# Patient Record
Sex: Male | Born: 1935 | Race: White | Hispanic: No | Smoking: Former smoker
Health system: Southern US, Community
[De-identification: ages and names within clinical notes are randomized; demographics above are authoritative.]

## PROBLEM LIST (undated history)

## (undated) DIAGNOSIS — I10 Essential (primary) hypertension: Secondary | ICD-10-CM

## (undated) DIAGNOSIS — I4891 Unspecified atrial fibrillation: Secondary | ICD-10-CM

## (undated) DIAGNOSIS — R001 Bradycardia, unspecified: Secondary | ICD-10-CM

## (undated) DIAGNOSIS — I5189 Other ill-defined heart diseases: Secondary | ICD-10-CM

## (undated) DIAGNOSIS — I350 Nonrheumatic aortic (valve) stenosis: Secondary | ICD-10-CM

## (undated) DIAGNOSIS — E119 Type 2 diabetes mellitus without complications: Secondary | ICD-10-CM

## (undated) DIAGNOSIS — E160 Drug-induced hypoglycemia without coma: Secondary | ICD-10-CM

## (undated) DIAGNOSIS — T383X1A Poisoning by insulin and oral hypoglycemic [antidiabetic] drugs, accidental (unintentional), initial encounter: Secondary | ICD-10-CM

## (undated) DIAGNOSIS — E785 Hyperlipidemia, unspecified: Secondary | ICD-10-CM

## (undated) DIAGNOSIS — F0390 Unspecified dementia without behavioral disturbance: Secondary | ICD-10-CM

## (undated) HISTORY — DX: Nonrheumatic aortic (valve) stenosis: I35.0

## (undated) HISTORY — DX: Bradycardia, unspecified: R00.1

## (undated) HISTORY — DX: Hyperlipidemia, unspecified: E78.5

## (undated) HISTORY — DX: Unspecified atrial fibrillation: I48.91

## (undated) HISTORY — DX: Type 2 diabetes mellitus without complications: E11.9

## (undated) HISTORY — DX: Essential (primary) hypertension: I10

## (undated) HISTORY — DX: Unspecified dementia, unspecified severity, without behavioral disturbance, psychotic disturbance, mood disturbance, and anxiety: F03.90

## (undated) HISTORY — PX: OTHER SURGICAL HISTORY: SHX169

---

## 2000-12-17 ENCOUNTER — Ambulatory Visit (HOSPITAL_COMMUNITY): Admission: RE | Admit: 2000-12-17 | Discharge: 2000-12-17 | Payer: Self-pay | Admitting: Family Medicine

## 2000-12-17 ENCOUNTER — Encounter: Payer: Self-pay | Admitting: Family Medicine

## 2000-12-30 ENCOUNTER — Ambulatory Visit (HOSPITAL_COMMUNITY): Admission: RE | Admit: 2000-12-30 | Discharge: 2000-12-30 | Payer: Self-pay | Admitting: Family Medicine

## 2000-12-30 ENCOUNTER — Encounter: Payer: Self-pay | Admitting: Family Medicine

## 2006-09-29 ENCOUNTER — Inpatient Hospital Stay (HOSPITAL_COMMUNITY): Admission: AC | Admit: 2006-09-29 | Discharge: 2006-10-08 | Payer: Self-pay

## 2006-11-29 ENCOUNTER — Encounter: Admission: RE | Admit: 2006-11-29 | Discharge: 2006-11-29 | Payer: Self-pay | Admitting: Neurosurgery

## 2006-12-04 ENCOUNTER — Encounter (HOSPITAL_COMMUNITY): Admission: RE | Admit: 2006-12-04 | Discharge: 2007-01-07 | Payer: Self-pay | Admitting: Orthopedic Surgery

## 2007-01-09 ENCOUNTER — Encounter (HOSPITAL_COMMUNITY): Admission: RE | Admit: 2007-01-09 | Discharge: 2007-02-08 | Payer: Self-pay | Admitting: Orthopedic Surgery

## 2007-10-27 ENCOUNTER — Emergency Department (HOSPITAL_COMMUNITY): Admission: EM | Admit: 2007-10-27 | Discharge: 2007-10-28 | Payer: Self-pay | Admitting: Emergency Medicine

## 2007-11-26 ENCOUNTER — Ambulatory Visit (HOSPITAL_COMMUNITY): Admission: RE | Admit: 2007-11-26 | Discharge: 2007-11-26 | Payer: Self-pay | Admitting: Family Medicine

## 2007-12-10 ENCOUNTER — Ambulatory Visit: Payer: Self-pay | Admitting: Gastroenterology

## 2007-12-29 ENCOUNTER — Ambulatory Visit (HOSPITAL_COMMUNITY): Admission: RE | Admit: 2007-12-29 | Discharge: 2007-12-29 | Payer: Self-pay | Admitting: Gastroenterology

## 2007-12-29 ENCOUNTER — Ambulatory Visit: Payer: Self-pay | Admitting: Gastroenterology

## 2007-12-29 ENCOUNTER — Encounter: Payer: Self-pay | Admitting: Gastroenterology

## 2009-04-11 ENCOUNTER — Inpatient Hospital Stay (HOSPITAL_COMMUNITY): Admission: EM | Admit: 2009-04-11 | Discharge: 2009-04-12 | Payer: Self-pay | Admitting: Emergency Medicine

## 2009-04-12 ENCOUNTER — Encounter (INDEPENDENT_AMBULATORY_CARE_PROVIDER_SITE_OTHER): Payer: Self-pay | Admitting: Cardiovascular Disease

## 2009-06-08 ENCOUNTER — Emergency Department (HOSPITAL_COMMUNITY): Admission: EM | Admit: 2009-06-08 | Discharge: 2009-06-08 | Payer: Self-pay | Admitting: Emergency Medicine

## 2009-07-18 ENCOUNTER — Ambulatory Visit (HOSPITAL_COMMUNITY): Admission: RE | Admit: 2009-07-18 | Discharge: 2009-07-18 | Payer: Self-pay | Admitting: Urology

## 2010-04-30 ENCOUNTER — Encounter: Payer: Self-pay | Admitting: Nephrology

## 2010-06-24 LAB — CK TOTAL AND CKMB (NOT AT ARMC)
CK, MB: 2.4 ng/mL (ref 0.3–4.0)
CK, MB: 2.5 ng/mL (ref 0.3–4.0)
Relative Index: INVALID (ref 0.0–2.5)
Relative Index: INVALID (ref 0.0–2.5)
Total CK: 96 U/L (ref 7–232)
Total CK: 97 U/L (ref 7–232)

## 2010-06-24 LAB — GLUCOSE, CAPILLARY
Glucose-Capillary: 101 mg/dL — ABNORMAL HIGH (ref 70–99)
Glucose-Capillary: 131 mg/dL — ABNORMAL HIGH (ref 70–99)
Glucose-Capillary: 216 mg/dL — ABNORMAL HIGH (ref 70–99)
Glucose-Capillary: 99 mg/dL (ref 70–99)

## 2010-06-24 LAB — CBC
HCT: 36.1 % — ABNORMAL LOW (ref 39.0–52.0)
HCT: 37.7 % — ABNORMAL LOW (ref 39.0–52.0)
Hemoglobin: 12.3 g/dL — ABNORMAL LOW (ref 13.0–17.0)
MCV: 88.8 fL (ref 78.0–100.0)
RBC: 4.24 MIL/uL (ref 4.22–5.81)
WBC: 7.6 10*3/uL (ref 4.0–10.5)
WBC: 8.3 10*3/uL (ref 4.0–10.5)

## 2010-06-24 LAB — COMPREHENSIVE METABOLIC PANEL
ALT: 18 U/L (ref 0–53)
AST: 16 U/L (ref 0–37)
Albumin: 3.7 g/dL (ref 3.5–5.2)
Alkaline Phosphatase: 22 U/L — ABNORMAL LOW (ref 39–117)
Alkaline Phosphatase: 26 U/L — ABNORMAL LOW (ref 39–117)
BUN: 25 mg/dL — ABNORMAL HIGH (ref 6–23)
BUN: 35 mg/dL — ABNORMAL HIGH (ref 6–23)
CO2: 24 mEq/L (ref 19–32)
Chloride: 101 mEq/L (ref 96–112)
Chloride: 103 mEq/L (ref 96–112)
Creatinine, Ser: 1.33 mg/dL (ref 0.4–1.5)
GFR calc Af Amer: >60 mL/min (ref >60)
GFR calc non Af Amer: 52 mL/min — ABNORMAL LOW (ref >60)
Glucose, Bld: 66 mg/dL — ABNORMAL LOW (ref 70–99)
Potassium: 4.1 mEq/L (ref 3.5–5.1)
Potassium: 4.1 mEq/L (ref 3.5–5.1)
Sodium: 137 mEq/L (ref 135–145)
Total Bilirubin: 0.5 mg/dL (ref 0.3–1.2)
Total Bilirubin: 0.6 mg/dL (ref 0.3–1.2)
Total Protein: 6.5 g/dL (ref 6.0–8.3)

## 2010-06-24 LAB — DIFFERENTIAL
Basophils Absolute: 0.1 10*3/uL (ref 0.0–0.1)
Basophils Absolute: 0.1 10*3/uL (ref 0.0–0.1)
Basophils Relative: 1 % (ref 0–1)
Basophils Relative: 1 % (ref 0–1)
Eosinophils Absolute: 0.2 10*3/uL (ref 0.0–0.7)
Eosinophils Relative: 3 % (ref 0–5)
Lymphocytes Relative: 35 % (ref 12–46)
Monocytes Absolute: 0.8 10*3/uL (ref 0.1–1.0)
Monocytes Relative: 10 % (ref 3–12)
Neutro Abs: 3.9 10*3/uL (ref 1.7–7.7)
Neutrophils Relative %: 51 % (ref 43–77)

## 2010-06-24 LAB — HEPARIN LEVEL (UNFRACTIONATED): Heparin Unfractionated: 0.48 IU/mL (ref 0.30–0.70)

## 2010-06-24 LAB — POCT CARDIAC MARKERS: Myoglobin, poc: 224 ng/mL (ref 12–200)

## 2010-06-24 LAB — CARDIAC PANEL(CRET KIN+CKTOT+MB+TROPI)
CK, MB: 1.8 ng/mL (ref 0.3–4.0)
Relative Index: INVALID (ref 0.0–2.5)
Relative Index: INVALID (ref 0.0–2.5)
Total CK: 91 U/L (ref 7–232)
Total CK: 99 U/L (ref 7–232)
Troponin I: <0.01 ng/mL (ref 0.00–0.06)
Troponin I: <0.01 ng/mL (ref 0.00–0.06)

## 2010-06-24 LAB — LIPID PANEL
Triglycerides: 118 mg/dL (ref <150)
VLDL: 24 mg/dL (ref 0–40)

## 2010-07-02 LAB — POCT I-STAT, CHEM 8
BUN: 25 mg/dL — ABNORMAL HIGH (ref 6–23)
Calcium, Ion: 1.12 mmol/L (ref 1.12–1.32)
Chloride: 108 mEq/L (ref 96–112)
Creatinine, Ser: 1.2 mg/dL (ref 0.4–1.5)
Glucose, Bld: 223 mg/dL — ABNORMAL HIGH (ref 70–99)
HCT: 39 % (ref 39.0–52.0)
Hemoglobin: 13.3 g/dL (ref 13.0–17.0)
Potassium: 4.4 meq/L (ref 3.5–5.1)
Sodium: 138 meq/L (ref 135–145)
TCO2: 23 mmol/L (ref 0–100)

## 2010-07-02 LAB — URINALYSIS, ROUTINE W REFLEX MICROSCOPIC
Glucose, UA: 100 mg/dL — AB
Ketones, ur: NEGATIVE mg/dL
Specific Gravity, Urine: 1.025 (ref 1.005–1.030)
pH: 6.5 (ref 5.0–8.0)

## 2010-07-02 LAB — URINE CULTURE

## 2010-07-02 LAB — URINE MICROSCOPIC-ADD ON

## 2010-08-22 NOTE — Op Note (Signed)
Juan Pham, Juan Pham                 ACCOUNT NO.:  [id number]   MEDICAL RECORD NO.:  [id number]          PATIENT TYPE:  AMB   LOCATION:  DAY                           FACILITY:  [facility]   PHYSICIAN:  Kassie Mends, M.D.      DATE OF BIRTH:  23-Nov-1930   DATE OF PROCEDURE:  12/29/2007  DATE OF DISCHARGE:                               OPERATIVE REPORT   REFERRING PHYSICIAN:  Patrica Duel, MD.   PROCEDURES:  1. Colonoscopy with cold forceps polypectomy.  2. Esophagogastroduodenoscopy with cold forceps biopsy.   INDICATIONS FOR EXAM:  Juan Pham is a 77 year old male who presents with  abdominal pain.  He also had an episode of diverticulitis.  He has GERD  which he is controlled on omeprazole.   FINDINGS:  1. Frequent sigmoid colon diverticula.  A 4-mm sessile descending      colon polyp removed via cold forceps.  Otherwise no masses,      inflammatory changes or AVM seen.  2. Small internal hemorrhoids.  Otherwise normal rectopexy of the      rectum.  3. Normal esophagus without evidence of Barrett's, mass, erosion,      ulcerations, or stricture.  4. Diffuse erythema with intermittent areas of normal gastric mucosa      seen beginning in the proximal stomach and extending into the      antrum.  Few erosions with evidence of active oozing.  No active      bleeding.  Biopsies obtained via cold forceps to evaluate for      a.     pylori gastritis.  5. Normal duodenal bulb and second portion of the duodenum.   DIAGNOSES:  1. Small descending colon polyp.  2. Moderate sigmoid diverticulosis.  3. Small internal hemorrhoids.  4. Moderate gastritis likely secondary to aspirin.   RECOMMENDATIONS:  1. Juan Pham could have a follow up colonoscopy in 10-15 years if he      remains healthy.  2. No aspirin, NSAIDs, or anticoagulation for 7 days.  3. He should follow a high-fiber diet.  He is given a handout on high-      fiber diet, gastritis, constipation, polyps, diverticulosis, and  hemorrhoids.  4. Will call Juan Pham with the results of his biopsies.  He will be      scheduled followup after the biopsy results are known.  5. Continue omeprazole 20 mg daily.   MEDICATIONS:  1. Demerol 50 mg IV.  2. Versed 3 mg IV.   PROCEDURE TECHNIQUE:  Physical exam was performed.  Informed consent was  obtained from the patient after explaining the benefits, risks, and  alternatives of the procedure.  The patient was connected to the monitor  and placed in the left lateral position.  Continuous oxygen was provided  by nasal cannula and IV medicine administered through an indwelling  cannula.  After administration of sedation and rectal exam, the  patient's rectum was intubated and the scope was advanced under direct  visualization to the cecum.  The scope was removed slowly by carefully  examining the color, texture,  anatomy, and integrity of the mucosa on  the way out.   After the colonoscopy, the patient's esophagus was intubated with the  diagnostic gastroscope and the scope was advanced under direct  visualization to the second portion of the duodenum.  The scope was  removed slowly by carefully examining the color, texture, anatomy, and  integrity of the mucosa on the way out.  The patient was recovered in  endoscopy and discharged home in a satisfactory condition.   PATH:  Simple adenoma. Chronic gastritis.      Kassie Mends, M.D.  Electronically Signed     SM/MEDQ  D:  12/29/2007  T:  12/30/2007  Job:  [id number]   cc:   Patrica Duel, M.D.  Fax: [phone]
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
## 2011-01-05 LAB — POCT CARDIAC MARKERS
CKMB, poc: 2.1
Myoglobin, poc: 115
Myoglobin, poc: 72.1
Operator id: 215201

## 2011-01-05 LAB — CBC
MCHC: 33.6
Platelets: 325
RBC: 4.3
WBC: 8.1

## 2011-01-05 LAB — DIFFERENTIAL
Basophils Relative: 0
Monocytes Relative: 10
Neutro Abs: 4.3
Neutrophils Relative %: 53

## 2011-01-05 LAB — BASIC METABOLIC PANEL
BUN: 35 — ABNORMAL HIGH
CO2: 24
Calcium: 9.3
Creatinine, Ser: 1.31
GFR calc Af Amer: >60

## 2011-01-24 LAB — BASIC METABOLIC PANEL
BUN: 18
BUN: 23
BUN: 23
Calcium: 8.8
Chloride: 107
Chloride: 109
Chloride: 96
Creatinine, Ser: 1.01
Creatinine, Ser: 1.07
Creatinine, Ser: 1.12
GFR calc Af Amer: >60
GFR calc Af Amer: >60
GFR calc non Af Amer: 50 — ABNORMAL LOW
GFR calc non Af Amer: >60
Glucose, Bld: 129 — ABNORMAL HIGH
Glucose, Bld: 163 — ABNORMAL HIGH
Potassium: 4.4
Potassium: 4.6
Sodium: 133 — ABNORMAL LOW
Sodium: 140

## 2011-01-24 LAB — CBC
HCT: 35.7 — ABNORMAL LOW
HCT: 37.2 — ABNORMAL LOW
HCT: 38.2 — ABNORMAL LOW
Hemoglobin: 12.5 — ABNORMAL LOW
Hemoglobin: 12.6 — ABNORMAL LOW
Hemoglobin: 12.8 — ABNORMAL LOW
MCHC: 33.4
MCHC: 33.8
MCV: 86.6
MCV: 86.8
MCV: 87.2
MCV: 87.6
Platelets: 309
Platelets: 388
Platelets: 438 — ABNORMAL HIGH
RBC: 4.06 — ABNORMAL LOW
RBC: 4.25
RBC: 4.33
RDW: 14.3 — ABNORMAL HIGH
RDW: 14.5 — ABNORMAL HIGH
WBC: 10.9 — ABNORMAL HIGH
WBC: 12.3 — ABNORMAL HIGH
WBC: 13.7 — ABNORMAL HIGH

## 2011-01-24 LAB — TYPE AND SCREEN: Antibody Screen: NEGATIVE

## 2011-01-24 LAB — I-STAT 8, (EC8 V) (CONVERTED LAB)
Bicarbonate: 21.6
HCT: 40
Hemoglobin: 13.6
Operator id: 151321
Sodium: 138
TCO2: 23
pCO2, Ven: 36.8 — ABNORMAL LOW

## 2011-01-24 LAB — HEMOGLOBIN A1C: Hgb A1c MFr Bld: 7.1 — ABNORMAL HIGH

## 2011-01-24 LAB — PROTIME-INR: INR: 1.2

## 2012-02-21 ENCOUNTER — Other Ambulatory Visit (HOSPITAL_COMMUNITY): Payer: Self-pay

## 2012-02-21 DIAGNOSIS — I359 Nonrheumatic aortic valve disorder, unspecified: Secondary | ICD-10-CM

## 2012-03-12 ENCOUNTER — Other Ambulatory Visit (HOSPITAL_COMMUNITY): Payer: Self-pay | Admitting: Vascular Surgery

## 2012-03-12 DIAGNOSIS — I350 Nonrheumatic aortic (valve) stenosis: Secondary | ICD-10-CM

## 2012-03-13 ENCOUNTER — Other Ambulatory Visit (HOSPITAL_COMMUNITY): Payer: Self-pay | Admitting: Internal Medicine

## 2012-03-13 DIAGNOSIS — R69 Illness, unspecified: Secondary | ICD-10-CM

## 2012-03-24 ENCOUNTER — Other Ambulatory Visit (HOSPITAL_COMMUNITY): Payer: Self-pay | Admitting: Cardiovascular Disease

## 2012-03-24 DIAGNOSIS — I359 Nonrheumatic aortic valve disorder, unspecified: Secondary | ICD-10-CM

## 2012-04-14 ENCOUNTER — Ambulatory Visit (HOSPITAL_COMMUNITY)
Admission: RE | Admit: 2012-04-14 | Discharge: 2012-04-14 | Disposition: A | Discharge status: Home / Self Care | Payer: Medicare Other | Source: Ambulatory Visit | Attending: Cardiovascular Disease | Admitting: Cardiovascular Disease

## 2012-04-14 DIAGNOSIS — I359 Nonrheumatic aortic valve disorder, unspecified: Secondary | ICD-10-CM | POA: Insufficient documentation

## 2012-04-14 DIAGNOSIS — I369 Nonrheumatic tricuspid valve disorder, unspecified: Secondary | ICD-10-CM | POA: Insufficient documentation

## 2012-04-14 DIAGNOSIS — I10 Essential (primary) hypertension: Secondary | ICD-10-CM | POA: Insufficient documentation

## 2012-04-14 DIAGNOSIS — E119 Type 2 diabetes mellitus without complications: Secondary | ICD-10-CM | POA: Insufficient documentation

## 2012-04-14 NOTE — Progress Notes (Signed)
2D Echo Performed 04/14/2012    Clearence Ped, RCS

## 2012-08-26 ENCOUNTER — Ambulatory Visit (HOSPITAL_COMMUNITY)
Admission: RE | Admit: 2012-08-26 | Discharge: 2012-08-26 | Disposition: A | Discharge status: Home / Self Care | Payer: Medicare Other | Source: Ambulatory Visit | Attending: Orthopaedic Surgery | Admitting: Orthopaedic Surgery

## 2012-08-26 DIAGNOSIS — IMO0001 Reserved for inherently not codable concepts without codable children: Secondary | ICD-10-CM | POA: Insufficient documentation

## 2012-08-26 DIAGNOSIS — M542 Cervicalgia: Secondary | ICD-10-CM | POA: Insufficient documentation

## 2012-08-26 DIAGNOSIS — M6281 Muscle weakness (generalized): Secondary | ICD-10-CM | POA: Insufficient documentation

## 2012-09-02 ENCOUNTER — Ambulatory Visit (HOSPITAL_COMMUNITY)
Admission: RE | Admit: 2012-09-02 | Discharge: 2012-09-02 | Disposition: A | Discharge status: Home / Self Care | Payer: Medicare Other | Source: Ambulatory Visit | Attending: Orthopaedic Surgery | Admitting: Orthopaedic Surgery

## 2012-09-02 DIAGNOSIS — M2560 Stiffness of unspecified joint, not elsewhere classified: Secondary | ICD-10-CM

## 2012-09-02 NOTE — Progress Notes (Signed)
Physical Therapy Treatment Patient Details  Name: Juan Pham MRN: [id number] Date of Birth: [birth year] Charge:  There ex 8:45: 913; manual 9:15 -9:25; there ex 2; manual 1 Today's Date: 09/02/2012 Time:845  - 925    Visit#: 2 of 8  Re-eval: 09/25/12    Authorization: BCBS Medicaid  Authorization Visit#:  2 of   8  Subjective: Symptoms/Limitations Symptoms: Juan Pham states that he has been trying to do his exercises at home.   Pain Assessment Pain Score:   5 Pain Location: Neck Pain Orientation: Right;Left Pain Onset: More than a month ago    Exercise/Treatments   Machines for Strengthening UBE (Upper Arm Bike): 4'   Standing Exercises Neck Retraction: 10 reps Other Standing Exercises: scapular protraction/retraction, elevation depression hands on wall x 10 Seated Exercises Cervical Isometrics: Extension;Right lateral flexion;Left lateral flexion;5 reps Lateral Flexion: 5 reps X to V: 10 reps Shoulder Shrugs: 5 reps Shoulder Rolls: 5 reps Supine Exercises Neck Retraction: 5 reps  Manual Therapy Manual Therapy: Myofascial release Myofascial Release: with suboccipital release and traction to B cervical mm to decrease adhesions; Grade II jt mobs  Physical Therapy Assessment and Plan PT Assessment and Plan Clinical Impression Statement: Pt  appears to have some improvement in ROM.  Will continue to progress with stability of cervical and scapular area as well as improving ROM PT Duration: 4 weeks PT Treatment/Interventions: Therapeutic activities;Therapeutic exercise;Modalities;Manual techniques PT Plan: begin standing UE flexion,(may need to just work on being able to stand erect prior to introducing UE flexion)>    Goals  progressing  Problem List Patient Active Problem List   Diagnosis Date Noted  . Stiffness of joints, not elsewhere classified, multiple sites 09/02/2012       GP    Juan Pham,Juan Pham 09/02/2012, 10:14 AM
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
## 2012-09-05 ENCOUNTER — Ambulatory Visit (HOSPITAL_COMMUNITY)
Admission: RE | Admit: 2012-09-05 | Discharge: 2012-09-05 | Disposition: A | Discharge status: Home / Self Care | Payer: Medicare Other | Source: Ambulatory Visit | Attending: Orthopaedic Surgery | Admitting: Orthopaedic Surgery

## 2012-09-05 DIAGNOSIS — M2560 Stiffness of unspecified joint, not elsewhere classified: Secondary | ICD-10-CM

## 2012-09-05 NOTE — Progress Notes (Signed)
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 Pain Assessment Currently in Pain?: No/denies  Objective:   Exercise/Treatments Machines for Strengthening UBE (Upper Arm Bike): 4' backwards Standing Exercises Neck Retraction: 10 reps Upper Extremity Flexion with Stabilization: 5 reps Other Standing Exercises: scapular protraction/retraction, elevation depression hands on wall x 10 Seated Exercises Neck Retraction: 10 reps Cervical Rotation: Both;5 reps Lateral Flexion: 5 reps X to V: 10 reps Shoulder Shrugs: 10 reps Supine Exercises Neck Retraction: 5 reps  Manual Therapy Manual Therapy: Other (comment) Other Manual Therapy: suboccipital release and traction to B cervical mm to decrease adhesions;  Physical Therapy Assessment and Plan PT Assessment and Plan Clinical Impression Statement: Session focus on posture awareness, required multimodal cueing to reduce foward head, foward rolled shoulders. Manual cervical PROM, suboccipitial release and traction complete to improve ROM and reduce tightness.  Pt making gains with ROM. PT Plan: Continue with current POC.    Goals    Problem List Patient Active Problem List   Diagnosis Date Noted  . Stiffness of joints, not elsewhere classified, multiple sites 09/02/2012    PT - End of Session Activity Tolerance: Patient tolerated treatment well General Behavior During Therapy: [facility] for tasks assessed/performed Cognition: WFL for tasks performed  GP    Juel Burrow 09/05/2012, 11:12 AM

## 2012-09-09 ENCOUNTER — Ambulatory Visit (HOSPITAL_COMMUNITY)
Admission: RE | Admit: 2012-09-09 | Discharge: 2012-09-09 | Disposition: A | Discharge status: Home / Self Care | Payer: Medicare Other | Source: Ambulatory Visit | Attending: Orthopaedic Surgery | Admitting: Orthopaedic Surgery

## 2012-09-09 DIAGNOSIS — M6281 Muscle weakness (generalized): Secondary | ICD-10-CM | POA: Insufficient documentation

## 2012-09-09 DIAGNOSIS — M2560 Stiffness of unspecified joint, not elsewhere classified: Secondary | ICD-10-CM

## 2012-09-09 DIAGNOSIS — IMO0001 Reserved for inherently not codable concepts without codable children: Secondary | ICD-10-CM | POA: Insufficient documentation

## 2012-09-09 DIAGNOSIS — M542 Cervicalgia: Secondary | ICD-10-CM | POA: Insufficient documentation

## 2012-09-09 NOTE — Progress Notes (Signed)
Physical Therapy Treatment Patient Details  Name: Juan Pham MRN: [id number] Date of Birth: [birth year]  Today's Date: 09/09/2012 Time: 4098-1191 PT Time Calculation (min): 41 min  Visit#: 4 of 8  Re-eval: 09/25/12  charge:  There ex 8:03-830: manual 8:30-844  Authorization: BCBS Medicaid   Authorization Visit#: 4 of 8   Subjective: Symptoms/Limitations Symptoms: Pt chopped wood for the past two days with increased neck pain Pain Assessment Pain Score:   5 Pain Location: Neck Pain Orientation: Right;Left Pain Type: Chronic pain Pain Onset: More than a month ago    Exercise/Treatments   Machines for Strengthening UBE (Upper Arm Bike): 4' backwards Theraband Exercises Scapula Retraction: 10 reps;Green Shoulder Extension: 10 reps;Green Rows: 10 reps;Green Standing Exercises Neck Retraction: 10 reps Upper Extremity Flexion with Stabilization: 10 reps;Limitations UE Flexion with Stabilization Limitations: to 90 degrees only Other Standing Exercises: scapular protraction/retraction, elevation depression hands on wall x 10 Other Standing Exercises: chest stretch x 10; elbows into corner x 0. Seated Exercises Neck Retraction: 10 reps Cervical Rotation: Both;5 reps Lateral Flexion: 5 reps X to V: 10 reps Shoulder Shrugs: 10 reps Supine Exercises Neck Retraction: 5 reps  Manual Therapy Manual Therapy: Myofascial release Myofascial Release: with suboccipital release and manual traction to B cervical mm; Grade II jt mobilizations  Physical Therapy Assessment and Plan PT Assessment and Plan Clinical Impression Statement: Pt treatment continues to focus on posture and scapular stabilzation .  Pt added new exercises to address these issues.  Pt continues to make small gains with ROM PT Frequency: Min 2X/week PT Duration: 4 weeks PT Plan: Continue with current POC.    Goals   progressing Problem List Patient Active Problem List   Diagnosis Date Noted  . Stiffness of  joints, not elsewhere classified, multiple sites 09/02/2012    PT - End of Session Activity Tolerance: Patient tolerated treatment well General Behavior During Therapy: [facility] for tasks assessed/performed Cognition: WFL for tasks performed PT Plan of Care PT Home Exercise Plan: given  GP Functional Assessment Tool Used: clinical judgement,(pain/ROm  RUSSELL,CINDY 09/09/2012, 8:54 AM

## 2012-09-11 ENCOUNTER — Ambulatory Visit (HOSPITAL_COMMUNITY)
Admission: RE | Admit: 2012-09-11 | Discharge: 2012-09-11 | Disposition: A | Discharge status: Home / Self Care | Payer: Medicare Other | Source: Ambulatory Visit | Attending: Orthopaedic Surgery | Admitting: Orthopaedic Surgery

## 2012-09-11 DIAGNOSIS — M2560 Stiffness of unspecified joint, not elsewhere classified: Secondary | ICD-10-CM

## 2012-09-11 NOTE — Progress Notes (Addendum)
Physical Therapy Treatment Patient Details  Name: Juan Pham MRN: [id number] Date of Birth: 02-Jun-1930  Today's Date: 09/11/2012 Time: 7829-5621 PT Time Calculation (min): 39 min Charge: Therex 30' [phone]), Manual x8' [phone])  Visit#: 5 of 8  Re-eval: 09/25/12    Authorization: BCBS Medicaid  Authorization Time Period:    Authorization Visit#: 5 of 8   Subjective: Symptoms/Limitations Symptoms: Pt reported completeing HEP this morning at the breakfast table, no pain today.  Pt reports reduction in headaches. Pain Assessment Currently in Pain?: No/denies  Objective:   Exercise/Treatments Stretches Corner Stretch: 4 reps;30 seconds Machines for Strengthening UBE (Upper Arm Bike): 4' backwards Theraband Exercises Scapula Retraction: 10 reps;Green Shoulder Extension: 10 reps;Green Rows: 10 reps;Green Standing Exercises Neck Retraction: 10 reps Upper Extremity Flexion with Stabilization: 10 reps;Limitations UE Flexion with Stabilization Limitations: to 90 degrees only Seated Exercises Neck Retraction: 10 reps Supine Exercises Neck Retraction: 5 reps    Physical Therapy Assessment and Plan PT Assessment and Plan Clinical Impression Statement: Continued treatment for postural awareness, postural and scapular strengthening and stabilization.  Pt able to demonstrate decreased forward headed and forward rolled posture at end of sessoni.   PT Plan: Continue with current POC.    Goals    Problem List Patient Active Problem List   Diagnosis Date Noted  . Stiffness of joints, not elsewhere classified, multiple sites 09/02/2012    PT - End of Session Activity Tolerance: Patient tolerated treatment well General Behavior During Therapy: [facility] for tasks assessed/performed Cognition: WFL for tasks performed  GP    Juel Burrow 09/11/2012, 9:04 AM

## 2012-09-16 ENCOUNTER — Ambulatory Visit (HOSPITAL_COMMUNITY)
Admission: RE | Admit: 2012-09-16 | Discharge: 2012-09-16 | Disposition: A | Discharge status: Home / Self Care | Payer: Medicare Other | Source: Ambulatory Visit | Attending: Orthopaedic Surgery | Admitting: Orthopaedic Surgery

## 2012-09-16 NOTE — Progress Notes (Signed)
Physical Therapy Treatment Patient Details  Name: Juan Pham MRN: [id number] Date of Birth: Apr 21, 1930  Today's Date: 09/16/2012 Time: 0802-0850 PT Time Calculation (min): 48 min  Visit#: 6 of 8  Re-eval: 09/25/12 Authorization: BCBS Medicare  Authorization Visit#: 6 of 8  Charges:  Therex 34, manual 12'  Subjective: Symptoms/Limitations Symptoms: Pt states he is doing well today.  Reports overall improving headaches, some Lt.shoulder pain today. Pain Assessment Currently in Pain?: Yes Pain Score:   3 Pain Location: Neck  Exercise/Treatments stetches Corner Stretch: 4 reps;30 seconds Machines for Strengthening UBE (Upper Arm Bike): 5' backwards Theraband Exercises Scapula Retraction: 10 reps;Green Shoulder Extension: 10 reps;Green Rows: 10 reps;Green Standing Exercises Other Standing Exercises: scapular protraction/retraction, elevation depression hands on wall x 10 Seated Exercises Neck Retraction: 10 reps Cervical Rotation: Both;5 reps Shoulder Shrugs: 10 reps   Manual Therapy Myofascial Release: Myofascial release, suboccipital release and manual traction to Bilateral cervical musculature  Physical Therapy Assessment and Plan PT Assessment and Plan Clinical Impression Statement: Pt requires verbal and tactile cues to decrease recruitment of surrounding musculature, namely upper traps with exercises.  Pt with difficulty relaxing trap muscles.   Pt with extreme forward cervical posture which makes it difficul to perform exercises in proper form.   PT Plan: Continue with current POC.  May try seated massage to target cervical-thoracic musculature.     Problem List Patient Active Problem List   Diagnosis Date Noted  . Stiffness of joints, not elsewhere classified, multiple sites 09/02/2012    PT - End of Session Activity Tolerance: Patient tolerated treatment well General Behavior During Therapy: [facility] for tasks assessed/performed Cognition: WFL for tasks  performed   Lurena Nida, PTA/CLT 09/16/2012, 9:38 AM

## 2012-09-18 ENCOUNTER — Ambulatory Visit (HOSPITAL_COMMUNITY)
Admission: RE | Admit: 2012-09-18 | Discharge: 2012-09-18 | Disposition: A | Discharge status: Home / Self Care | Payer: Medicare Other | Source: Ambulatory Visit | Attending: Orthopaedic Surgery | Admitting: Orthopaedic Surgery

## 2012-09-18 NOTE — Progress Notes (Signed)
Physical Therapy Treatment Patient Details  Name: Juan Pham MRN: [id number] Date of Birth: 1930/10/16  Today's Date: 09/18/2012 Time: 4098-1191 PT Time Calculation (min): 38 min  Visit#: 7 of 8  Re-eval: 09/25/12 Charges: Therex x 30' Manual x 8'  Authorization: BCBS Medicare  Authorization Visit#: 7 of 8   Subjective: Symptoms/Limitations Symptoms: Pt is currently pain free. He states that he is doing some of his exercises at home. Pain Assessment Currently in Pain?: No/denies   Exercise/Treatments Stretches Corner Stretch: 2 reps;30 seconds Machines for Strengthening UBE (Upper Arm Bike): 5' @ 2.0 backwards Theraband Exercises Scapula Retraction: 10 reps;Green Shoulder Extension: 10 reps;Green Rows: 10 reps;Green Standing Exercises Other Standing Exercises: scapular protraction/retraction, elevation depression hands on wall x 10 Seated Exercises Neck Retraction: 10 reps Cervical Rotation: 10 reps Lateral Flexion: 10 reps Shoulder Shrugs: 10 reps  Manual Therapy Myofascial Release: to bilateral cervical musculature to decrease tightness and improve functional ROM  Physical Therapy Assessment and Plan PT Assessment and Plan Clinical Impression Statement: Pt requires max multimodal cueing to complete all exercises with correct form. Pt requires frequent vc's to avoid looking down. Pt is limited by poor cervical musculature coordination. Manual techniques completed to bilateral cervical musculature to decrease tightness and improve functional ROM. PT Plan: Reassess next session.     Problem List Patient Active Problem List   Diagnosis Date Noted  . Stiffness of joints, not elsewhere classified, multiple sites 09/02/2012    PT - End of Session Activity Tolerance: Patient tolerated treatment well General Behavior During Therapy: [facility] for tasks assessed/performed Cognition: WFL for tasks performed PT Plan of Care PT Patient Instructions: Pt educated on  improtance of HEP compliance.  Seth Bake, PTA 09/18/2012, 9:37 AM

## 2012-09-23 ENCOUNTER — Ambulatory Visit (HOSPITAL_COMMUNITY)
Admission: RE | Admit: 2012-09-23 | Discharge: 2012-09-23 | Disposition: A | Discharge status: Home / Self Care | Payer: Medicare Other | Source: Ambulatory Visit | Attending: Orthopaedic Surgery | Admitting: Orthopaedic Surgery

## 2012-09-23 DIAGNOSIS — M2560 Stiffness of unspecified joint, not elsewhere classified: Secondary | ICD-10-CM

## 2012-09-23 NOTE — Evaluation (Addendum)
Physical Therapy Reassessment Patient Details  Name: TILLMAN KAZMIERSKI MRN: [id number] Date of Birth: 1930/06/07 Charge:  ROM test [phone]; manual [phone]; there ex 830-840 pt then filled out questionaire. Today's Date: 09/23/2012 Time: 0805-0845 PT Time Calculation (min): 40 min              Visit#: 8 of 8  Re-eval:   Assessment Diagnosis: cervical pain Next MD Visit: 09/24/2012 Prior Therapy: none  Authorization: BCBS Medicare    Authorization Time Period:    Authorization Visit#: 8 of 8   Past Medical History: No past medical history on file. Past Surgical History: No past surgical history on file.  Subjective Symptoms/Limitations Symptoms: Pt states that he is not doing his exercises at home.  Pt states that he feels 30% better since starting therapy How long can you sit comfortably?: no effect How long can you stand comfortably?: no effect How long can you walk comfortably?: no effect Repetition: Increases Symptoms Pain Assessment Pain Score:  (worst pain in the past week has been a 5/10) Pain Location: Neck Pain Orientation: Right Pain Type: Chronic pain Pain Onset: More than a month ago Pain Frequency: Intermittent Pain Relieving Factors: leaning head against his chair at home.  Prior Functioning  Prior Function Vocation: Retired Leisure: Hobbies-yes (Comment)  Cognition/Observation Cognition Overall Cognitive Status: Within Functional Limits for tasks assessed  Sensation/Coordination/Flexibility/Functional Tests Functional Tests Functional Tests: neck disability was 13/50 now 9/50  Assessment Cervical AROM Cervical Flexion: decreased 20% with reps increasing pain,. Cervical Extension: wnl reps increases pain causes pain into shle Cervical - Right Side Bend: decreased 15% wasdecreased 50% Cervical - Left Side Bend: decreased 15% was decreased  30%  Cervical - Right Rotation: decreased 30% was decreased 70% Cervical - Left Rotation: decreased 30% was  decreased 30% Cervical Strength Cervical Extension: 4/5 (was 4/5) Cervical - Right Side Bend: 4/5 (was 3+/5) Cervical - Left Side Bend: 3/5 (was 3/5)  Exercise/Treatments Mobility/Balance  Posture/Postural Control Posture/Postural Control: Postural limitations Postural Limitations: forward head, increased kyphosis.    Standing Exercises Neck Retraction: 10 reps Other Standing Exercises: scapular retraction x 10 Seated Exercises Cervical Isometrics: Extension;Right lateral flexion;Left lateral flexion;5 reps Neck Retraction: 5 reps Lateral Flexion: 5 reps Supine Exercises Neck Retraction: 5 reps Cervical Rotation: 5 reps Lateral Flexion: 5 reps    Physical Therapy Assessment and Plan PT Assessment and Plan Clinical Impression Statement:  Pt is improving with ROM but admits to not completing HEP. Pt encouraged to complete HEP to improve strength of postural mm and improve ROM.  Pt will discuss with MD next week the need to continue therapy. PT Plan: await MD decision on further therapy.    Goals Home Exercise Program PT Goal: Perform Home Exercise Program - Progress: Met PT Short Term Goals PT Short Term Goal 1: ROM to be improved 25% to improve safety of driving PT Short Term Goal 1 - Progress: Progressing toward goal PT Short Term Goal 2: strength to be increased 1/2 grade to decrease pain by 2 levels. PT Short Term Goal 2 - Progress: Progressing toward goal PT Long Term Goals Time to Complete Long Term Goals: 4 weeks PT Long Term Goal 1: Pt ROM to be improved by 50% to increase safty of driving PT Long Term Goal 1 - Progress: Progressing toward goal PT Long Term Goal 2: Pt strength to be improved by one grade to decrease pain level PT Long Term Goal 2 - Progress: Not met Long Term Goal 3: Pain level  to be no greater thana 2/10 80% of the day  Long Term Goal 3 Progress: Met  Problem List Patient Active Problem List   Diagnosis Date Noted  . Stiffness of joints, not  elsewhere classified, multiple sites 09/02/2012    PT - End of Session Activity Tolerance: Patient tolerated treatment well PT Plan of Care PT Home Exercise Plan: no new exercises given to pt as he is not doing his exercises given to him initially  GP Functional Assessment Tool Used: clinical judgement,(pain/ROM/neck disabiltiy Functional Limitation: Self care Self Care Current Status (W0981): At least 1 percent but less than 20 percent impaired, limited or restricted Self Care Goal Status (X9147): At least 20 percent but less than 40 percent impaired, limited or restricted  Jermane Brayboy,CINDY 09/23/2012, 9:37 AM  Physician Documentation Your signature is required to indicate approval of the treatment plan as stated above.  Please sign and either send electronically or make a copy of this report for your files and return this physician signed original.   Please mark one 1.__approve of plan  2. ___approve of plan with the following conditions.   ______________________________                                                          _____________________ Physician Signature                                                                                                             Date

## 2012-09-25 ENCOUNTER — Ambulatory Visit (HOSPITAL_COMMUNITY): Payer: Medicare Other | Admitting: Physical Therapy

## 2012-10-19 ENCOUNTER — Encounter: Payer: Self-pay | Admitting: *Deleted

## 2012-10-20 ENCOUNTER — Encounter: Payer: Self-pay | Admitting: Cardiovascular Disease

## 2012-10-20 ENCOUNTER — Ambulatory Visit (INDEPENDENT_AMBULATORY_CARE_PROVIDER_SITE_OTHER): Payer: Self-pay | Admitting: Cardiovascular Disease

## 2012-10-20 VITALS — BP 170/70 | Ht 68.0 in | Wt 165.0 lb

## 2012-10-20 DIAGNOSIS — E119 Type 2 diabetes mellitus without complications: Secondary | ICD-10-CM | POA: Insufficient documentation

## 2012-10-20 DIAGNOSIS — Z8679 Personal history of other diseases of the circulatory system: Secondary | ICD-10-CM

## 2012-10-20 DIAGNOSIS — I10 Essential (primary) hypertension: Secondary | ICD-10-CM

## 2012-10-20 DIAGNOSIS — E785 Hyperlipidemia, unspecified: Secondary | ICD-10-CM

## 2012-10-20 DIAGNOSIS — I359 Nonrheumatic aortic valve disorder, unspecified: Secondary | ICD-10-CM

## 2012-10-20 DIAGNOSIS — I350 Nonrheumatic aortic (valve) stenosis: Secondary | ICD-10-CM | POA: Insufficient documentation

## 2012-10-20 MED ORDER — HYDROCHLOROTHIAZIDE 12.5 MG PO CAPS: 12.5000 mg | ORAL_CAPSULE | Freq: Every day | ORAL | Status: DC

## 2012-10-20 NOTE — Patient Instructions (Signed)
Your physician has recommended you make the following change in your medication: HCTZ 12.5 MG has been added today. It has already been sent to [facility].  Your physician recommends that you schedule a follow-up appointment in: 6 MONTHS.

## 2012-10-20 NOTE — Progress Notes (Signed)
Patient ID: Juan Pham, male   DOB: 1930-08-31, 79 y.o.   MRN: [id number]     HPI: Juan Pham, is a 79 y.o. male who presents to the office today for six-month cardiology evaluation. Juan Pham is 79 year old gentleman who has a history of moderate aortic valve stenosis, hypertension, type 2 diabetes mellitus, hyperlipidemia, and has remote history of bradycardia. He also status Pat status post cataract and retinal surgery. An echo Doppler study in January 2014 showed normal systolic function with EF of 60-65% with grade 1 diastolic dysfunction. He had a mean transvalvular aortic gradient of 30 mm with a peak gradient of 47 mm, not significantly changed from one year ago and his gradient was 24 and 42 mm, respectively. He does have mitral annular calcification. Over the past 6 months, he has continued to do well. He specifically denies chest pain. He denies presyncope. He denies shortness of breath symptoms. However, he does note some mild leg swelling.  Past Medical History  Diagnosis Date  . Hypertension   . Hyperlipidemia   . Bradycardia   . Diabetes mellitus without complication     type II  . Aortic stenosis, moderate 04/14/12    ECHO  EF 60-65%  ECHO-04/16/11 EF>55% Normal LV size and systolic function. Mildly dilated let atrium. Dystolic dysfunction, probably mild.    Past Surgical History  Procedure Laterality Date  . Cateract    . Retna      No Known Allergies  Current Outpatient Prescriptions  Medication Sig Dispense Refill  . acetaminophen-codeine (TYLENOL #3) 300-30 MG per tablet Take 1 tablet by mouth as needed.      Marland Kitchen amLODipine-valsartan (EXFORGE) 10-320 MG per tablet Take 1 tablet by mouth daily.      Marland Kitchen aspirin 81 MG tablet Take 81 mg by mouth daily.      . carvedilol (COREG) 12.5 MG tablet Take 6.25 mg by mouth 2 (two) times daily with a meal. 1/2 tablet      . fenofibrate micronized (LOFIBRA) 134 MG capsule Take 134 mg by mouth daily.      . insulin NPH-regular  (NOVOLIN 70/30) (70-30) 100 UNIT/ML injection Inject into the skin 2 (two) times daily with a meal. 30 units      . meloxicam (MOBIC) 15 MG tablet Take 1 tablet by mouth daily.      Marland Kitchen omeprazole (PRILOSEC) 20 MG capsule Take 20 mg by mouth daily.      . simvastatin (ZOCOR) 20 MG tablet Take 20 mg by mouth daily.      . tamsulosin (FLOMAX) 0.4 MG CAPS Take by mouth daily.      . hydrochlorothiazide (MICROZIDE) 12.5 MG capsule Take 1 capsule (12.5 mg total) by mouth daily.  90 capsule  3   No current facility-administered medications for this visit.    Socially he is widowed has 3 children. There is a remote tobacco history having quit greater than 20 years ago.  ROS is negative for fevers, chills or night sweats.  He denies palpitations. He denies presyncope or syncope. He denies wheezing. He denies PND orthopnea. He denies chest pressure. He denies abdominal pain. He denies bleeding. Juan Pham is a mild leg swelling. Other system review is negative.  PE BP 170/70  Ht 5\' 8"  (1.727 m)  Wt 165 lb (74.844 kg)  BMI 25.09 kg/m2  General: Alert, oriented, no distress.  Skin: normal turgor, no rashes HEENT: Normocephalic, atraumatic. Pupils round and reactive; sclera anicteric;no lid lag.  Nose  without nasal septal hypertrophy Mouth/Parynx benign; Mallinpatti scale 2 Neck: No JVD, he has transmitted murmurs to his carotids bilaterally. Lungs: clear to ausculatation and percussion; no wheezing or rales Heart: RRR, s1 s2 normal 6 systolic left ear murmur and a width of aortic insufficiency. Abdomen: soft, nontender; no hepatosplenomehaly, BS+; abdominal aorta nontender and not dilated by palpation. Pulses 2+ Extremities: no clubbing cyanosis or edema, Homan's sign negative  Neurologic: grossly nonfocal  ECG: Sinus rhythm at 53 beats per minute. QTc interval 405 msec.  LABS:  BMET    Component Value Date/Time   NA 138 06/08/2009 1852   K 4.4 06/08/2009 1852   CL 108 06/08/2009 1852   CO2 24  04/12/2009 0217   GLUCOSE 223* 06/08/2009 1852   BUN 25* 06/08/2009 1852   CREATININE 1.2 06/08/2009 1852   CALCIUM 8.8 04/12/2009 0217   GFRNONAA 52* 04/12/2009 0217   GFRAA  Value: >60        The eGFR has been calculated using the MDRD equation. This calculation has not been validated in all clinical situations. eGFR's persistently <60 mL/min signify possible Chronic Kidney Disease. 04/12/2009 0217     Hepatic Function Panel     Component Value Date/Time   PROT 6.8 04/12/2009 0217   ALBUMIN 3.6 04/12/2009 0217   AST 16 04/12/2009 0217   ALT 14 04/12/2009 0217   ALKPHOS 22* 04/12/2009 0217   BILITOT 0.6 04/12/2009 0217     CBC    Component Value Date/Time   WBC 8.3 04/12/2009 0217   RBC 4.24 04/12/2009 0217   HGB 13.3 06/08/2009 1852   HCT 39.0 06/08/2009 1852   PLT 317 04/12/2009 0217   MCV 88.8 04/12/2009 0217   MCHC 33.9 04/12/2009 0217   RDW 13.8 04/12/2009 0217   LYMPHSABS 2.9 04/12/2009 0217   MONOABS 0.9 04/12/2009 0217   EOSABS 0.3 04/12/2009 0217   BASOSABS 0.1 04/12/2009 0217     BNP No results found for this basename: probnp    Lipid Panel     Component Value Date/Time   CHOL  Value: 121        ATP III CLASSIFICATION:  <200     mg/dL   Desirable  409-811  mg/dL   Borderline High  >=914    mg/dL   High        10/15/2954 0218   TRIG 118 04/12/2009 0218   HDL 28* 04/12/2009 0218   CHOLHDL 4.3 04/12/2009 0218   VLDL 24 04/12/2009 0218   LDLCALC  Value: 69        Total Cholesterol/HDL:CHD Risk Coronary Heart Disease Risk Table                     Men   Women  1/2 Average Risk   3.4   3.3  Average Risk       5.0   4.4  2 X Average Risk   9.6   7.1  3 X Average Risk  23.4   11.0        Use the calculated Patient Ratio above and the CHD Risk Table to determine the patient's CHD Risk.        ATP III CLASSIFICATION (LDL):  <100     mg/dL   Optimal  213-086  mg/dL   Near or Above                    Optimal  130-159  mg/dL   Borderline  160-189  mg/dL   High  >960     mg/dL   Very High 07/12/4096 1191     RADIOLOGY: No  results found.    ASSESSMENT AND PLAN: Juan Pham is now 79 years appear pressure today is elevated. He is on amlodipine/valsartan 10/320. Elected to add HCTZ 12.5 mg to his medical regimen. He tells me will be seeing Dr. Ernie Hew for the next month and laboratory will be obtained. I will review these when available. Again discussed symptoms associated with aortic stenosis. I will see him in 6 months for cardiology reassessment.     Lennette Bihari, MD, [facility]  10/20/2012 6:52 PM

## 2012-10-22 ENCOUNTER — Telehealth: Payer: Self-pay | Admitting: Cardiovascular Disease

## 2012-10-22 NOTE — Telephone Encounter (Signed)
York Spaniel he was here Monday and you told him if he needed anything to call you-Please call him about his medicine!

## 2012-10-22 NOTE — Telephone Encounter (Signed)
Returned call.  Stated he was seen by Dr. Tresa Endo Monday and a prescription was sent.  Pt stated he got a call from [facility] last night and today and he doesn't know what they need.  Pt informed RN will call pharmacy to find out what is needed and call him back.  Pt verbalized understanding and agreed w/ plan.  Call to [facility] and informed they call to get pt's permission to ship medicine.  Call to pt and informed.  Pt verbalized understanding and agreed w/ plan.  Pt will call them back.

## 2013-03-25 ENCOUNTER — Other Ambulatory Visit: Payer: Self-pay | Admitting: *Deleted

## 2013-03-25 MED ORDER — CARVEDILOL 12.5 MG PO TABS: 6.2500 mg | ORAL_TABLET | Freq: Two times a day (BID) | ORAL | Status: DC

## 2013-03-25 MED ORDER — FENOFIBRATE MICRONIZED 134 MG PO CAPS: 134.0000 mg | ORAL_CAPSULE | Freq: Every day | ORAL | Status: DC

## 2013-04-21 ENCOUNTER — Ambulatory Visit (INDEPENDENT_AMBULATORY_CARE_PROVIDER_SITE_OTHER): Payer: Medicare Other | Admitting: Cardiovascular Disease

## 2013-04-21 ENCOUNTER — Encounter: Payer: Self-pay | Admitting: Cardiovascular Disease

## 2013-04-21 VITALS — BP 156/60 | HR 60 | Ht 68.0 in | Wt 163.3 lb

## 2013-04-21 DIAGNOSIS — Z79899 Other long term (current) drug therapy: Secondary | ICD-10-CM

## 2013-04-21 DIAGNOSIS — E785 Hyperlipidemia, unspecified: Secondary | ICD-10-CM

## 2013-04-21 DIAGNOSIS — I10 Essential (primary) hypertension: Secondary | ICD-10-CM

## 2013-04-21 DIAGNOSIS — R5383 Other fatigue: Secondary | ICD-10-CM

## 2013-04-21 DIAGNOSIS — I359 Nonrheumatic aortic valve disorder, unspecified: Secondary | ICD-10-CM

## 2013-04-21 DIAGNOSIS — I8390 Asymptomatic varicose veins of unspecified lower extremity: Secondary | ICD-10-CM

## 2013-04-21 DIAGNOSIS — K219 Gastro-esophageal reflux disease without esophagitis: Secondary | ICD-10-CM

## 2013-04-21 DIAGNOSIS — R5381 Other malaise: Secondary | ICD-10-CM

## 2013-04-21 DIAGNOSIS — E119 Type 2 diabetes mellitus without complications: Secondary | ICD-10-CM

## 2013-04-21 DIAGNOSIS — I350 Nonrheumatic aortic (valve) stenosis: Secondary | ICD-10-CM

## 2013-04-21 NOTE — Progress Notes (Signed)
Patient ID: Juan Pham, male   DOB: 1931-01-20, 80 y.o.   MRN: [id number]      HPI: Juan Pham, is a 80 y.o. male who presents to the office today for six-month cardiology evaluation.  Juan Pham is 80 year old gentleman who has a history of moderate aortic valve stenosis, hypertension, type 2 diabetes mellitus, hyperlipidemia, and has remote history of bradycardia. He also is status post cataract and retinal surgery. An echo Doppler study in January 2014 showed normal systolic function with EF of 60-65% with grade 1 diastolic dysfunction. He had a mean transvalvular aortic gradient of 30 mm with a peak gradient of 47 mm, not significantly changed from one year ago and his gradient was 24 and 42 mm, respectively. He does have mitral annular calcification.  Over the past 6 months, Juan Pham has continued to do well. He specifically denies chest pain. He denies presyncope or syncope. He denies shortness of breath symptoms. He  does note some mild leg swelling. He has not had blood work checked in approximately a year  Past Medical History  Diagnosis Date  . Hypertension   . Hyperlipidemia   . Bradycardia   . Diabetes mellitus without complication     type II  . Aortic stenosis, moderate 04/14/12    ECHO  EF 60-65%  ECHO-04/16/11 EF>55% Normal LV size and systolic function. Mildly dilated let atrium. Dystolic dysfunction, probably mild.    Past Surgical History  Procedure Laterality Date  . Cateract    . Retna      No Known Allergies  Current Outpatient Prescriptions  Medication Sig Dispense Refill  . acetaminophen-codeine (TYLENOL #3) 300-30 MG per tablet Take 1 tablet by mouth as needed.      Marland Kitchen amLODipine-valsartan (EXFORGE) 10-320 MG per tablet Take 1 tablet by mouth daily.      Marland Kitchen aspirin 81 MG tablet Take 81 mg by mouth daily.      . carvedilol (COREG) 12.5 MG tablet Take 0.5 tablets (6.25 mg total) by mouth 2 (two) times daily with a meal. 1/2 tablet  90 tablet  3  .  fenofibrate micronized (LOFIBRA) 134 MG capsule Take 1 capsule (134 mg total) by mouth daily.  90 capsule  3  . hydrochlorothiazide (MICROZIDE) 12.5 MG capsule Take 1 capsule (12.5 mg total) by mouth daily.  90 capsule  3  . meloxicam (MOBIC) 15 MG tablet Take 1 tablet by mouth daily.      Marland Kitchen omeprazole (PRILOSEC) 20 MG capsule Take 20 mg by mouth daily.      . simvastatin (ZOCOR) 20 MG tablet Take 20 mg by mouth daily.      . tamsulosin (FLOMAX) 0.4 MG CAPS Take by mouth daily.      . insulin NPH-regular (NOVOLIN 70/30) (70-30) 100 UNIT/ML injection Inject into the skin 2 (two) times daily with a meal. 25 units in the AM and 5 units in the PM.       No current facility-administered medications for this visit.    Socially he is widowed has 3 children. There is a remote tobacco history having quit greater than 20 years ago.  ROS is negative for fevers, chills or night sweats. He denies change in vision or hearing. He denies headaches. He is unaware of lymphadenopathy. He denies cough wheezing or sputum production. He denies palpitations. He denies presyncope or syncope. He denies PND orthopnea. He denies chest pressure. He denies abdominal pain. He denies nausea vomiting or diarrhea. There is  no change in bowel or bladder habits. He denies blood in stool or urine He denies bleeding. He has mild leg swelling. He does have diabetes. He denies endocrine problems. He does note some musculoskeletal joint discomfort. He denies change in sleep pattern. Other comprehensive 14 point system review is negative.  PE BP 156/60  Pulse 60  Ht 5' 8"  (1.727 m)  Wt 163 lb 4.8 oz (74.072 kg)  BMI 24.84 kg/m2  Repeat blood pressure meds when checked by me was 128/70 General: Alert, oriented, no distress.  Skin: normal turgor, no rashes HEENT: Normocephalic, atraumatic. Pupils round and reactive; sclera anicteric;no lid lag.  Nose without nasal septal hypertrophy Mouth/Parynx benign; Mallinpatti scale 2 Neck: No  JVD, he has transmitted murmurs to his carotids bilaterally. Lungs: clear to ausculatation and percussion; no wheezing or rales Chest wall: No tenderness to palpation Heart: RRR, s1 s2 normal; 2/6 systolic murmur and a whiff of aortic insufficiency. Abdomen: soft, nontender; no hepatosplenomehaly, BS+; abdominal aorta nontender and not dilated by palpation. Back: No CVA tenderness Pulses 2+ Extremities: Moderate right lower extremity varicosities. Mild left lower extremity varicosity; no clubbing cyanosis or edema, Homan's sign negative  Neurologic: grossly nonfocal; cranial nerves grossly normal. Psychological: Affect flat.  ECG (independently read by me): Normal sinus rhythm at 60 beats per minute;incomplete right bundle branch block. Normal intervals. Last ECG from July 2014: Sinus rhythm at 53 beats per minute. QTc interval 405 msec.  LABS:  BMET    Component Value Date/Time   NA 138 06/08/2009 1852   K 4.4 06/08/2009 1852   CL 108 06/08/2009 1852   CO2 24 04/12/2009 0217   GLUCOSE 223* 06/08/2009 1852   BUN 25* 06/08/2009 1852   CREATININE 1.2 06/08/2009 1852   CALCIUM 8.8 04/12/2009 0217   GFRNONAA 52* 04/12/2009 0217   GFRAA  Value: >60        The eGFR has been calculated using the MDRD equation. This calculation has not been validated in all clinical situations. eGFR's persistently <60 mL/min signify possible Chronic Kidney Disease. 04/12/2009 0217     Hepatic Function Panel     Component Value Date/Time   PROT 6.8 04/12/2009 0217   ALBUMIN 3.6 04/12/2009 0217   AST 16 04/12/2009 0217   ALT 14 04/12/2009 0217   ALKPHOS 22* 04/12/2009 0217   BILITOT 0.6 04/12/2009 0217     CBC    Component Value Date/Time   WBC 8.3 04/12/2009 0217   RBC 4.24 04/12/2009 0217   HGB 13.3 06/08/2009 1852   HCT 39.0 06/08/2009 1852   PLT 317 04/12/2009 0217   MCV 88.8 04/12/2009 0217   MCHC 33.9 04/12/2009 0217   RDW 13.8 04/12/2009 0217   LYMPHSABS 2.9 04/12/2009 0217   MONOABS 0.9 04/12/2009 0217   EOSABS 0.3 04/12/2009 0217     BASOSABS 0.1 04/12/2009 0217     BNP No results found for this basename: probnp    Lipid Panel     Component Value Date/Time   CHOL  Value: 121        ATP III CLASSIFICATION:  <200     mg/dL   Desirable  200-239  mg/dL   Borderline High  >=240    mg/dL   High        04/12/2009 0218   TRIG 118 04/12/2009 0218   HDL 28* 04/12/2009 0218   CHOLHDL 4.3 04/12/2009 0218   VLDL 24 04/12/2009 0218   LDLCALC  Value: 69  Total Cholesterol/HDL:CHD Risk Coronary Heart Disease Risk Table                     Men   Women  1/2 Average Risk   3.4   3.3  Average Risk       5.0   4.4  2 X Average Risk   9.6   7.1  3 X Average Risk  23.4   11.0        Use the calculated Patient Ratio above and the CHD Risk Table to determine the patient's CHD Risk.        ATP III CLASSIFICATION (LDL):  <100     mg/dL   Optimal  100-129  mg/dL   Near or Above                    Optimal  130-159  mg/dL   Borderline  160-189  mg/dL   High  >190     mg/dL   Very High 04/12/2009 0218     RADIOLOGY: No results found.    ASSESSMENT AND PLAN: Juan. Schreck is an 59 years who has a history of hypertension, hyperlipidemia, type 2 diabetes mellitus, as well as documented moderate aortic valve stenosis. His last echo Doppler study one year ago in January 2014 revealed a mean transvalvular aortic gradient of 30 mm with a peak gradient of 47 mm and a calculated aortic valve area 1.1 cm. He continues to be relatively symptom-free with reference to his aortic stenosis. His blood pressure today on recheck by me was normal on his current medical regimen. He is taking simvastatin 20 mg as well as fenofibrate for his hyperlipidemia. I am recommending a followup laboratory obtained in the fasting state consisting of a CBC, CMP PT, TSH, lipid panel, and hemoglobin A1c. I will contact him regarding the results of his laboratory and adjustments will be made relative to his blood pressure medications or lipid medications if necessary.  He does have GERD but  this is controlled on his current dose of omeprazole. In 6 months, I am scheduling him for an 18 month followup echo Doppler study to reassess his aortic valve stenosis. He does have bilateral lower extremity varicose veins. I have suggested support stockings. I will see him back in the office in 6 months following his echo Doppler study for followup evaluation.   Troy Sine, MD, [facility]  04/21/2013 12:13 PM

## 2013-04-21 NOTE — Patient Instructions (Signed)
Your physician recommends that you schedule a follow-up appointment in: 6 Months  Your physician has requested that you have an echocardiogram. Echocardiography is a painless test that uses sound waves to create images of your heart. It provides your doctor with information about the size and shape of your heart and how well your heart's chambers and valves are working. This procedure takes approximately one hour. There are no restrictions for this procedure. 6 Months  Your physician recommends that you return for lab work CBC, CMP, A1C, TSH, LIPIDS

## 2013-04-22 ENCOUNTER — Encounter: Payer: Self-pay | Admitting: Cardiovascular Disease

## 2013-09-30 ENCOUNTER — Other Ambulatory Visit: Payer: Self-pay

## 2013-09-30 MED ORDER — HYDROCHLOROTHIAZIDE 12.5 MG PO CAPS: 12.5000 mg | ORAL_CAPSULE | Freq: Every day | ORAL | Status: DC

## 2013-09-30 NOTE — Telephone Encounter (Signed)
Rx was sent to pharmacy electronically. 

## 2013-11-26 ENCOUNTER — Telehealth: Payer: Self-pay | Admitting: *Deleted

## 2013-11-26 ENCOUNTER — Ambulatory Visit (HOSPITAL_COMMUNITY)
Admission: RE | Admit: 2013-11-26 | Discharge: 2013-11-26 | Disposition: A | Discharge status: Home / Self Care | Payer: Medicare Other | Source: Ambulatory Visit | Attending: Cardiology | Admitting: Cardiology

## 2013-11-26 DIAGNOSIS — I359 Nonrheumatic aortic valve disorder, unspecified: Secondary | ICD-10-CM | POA: Diagnosis not present

## 2013-11-26 DIAGNOSIS — I350 Nonrheumatic aortic (valve) stenosis: Secondary | ICD-10-CM

## 2013-11-26 DIAGNOSIS — E119 Type 2 diabetes mellitus without complications: Secondary | ICD-10-CM | POA: Diagnosis not present

## 2013-11-26 MED ORDER — AMLODIPINE BESYLATE-VALSARTAN 10-320 MG PO TABS: 1.0000 | ORAL_TABLET | Freq: Every day | ORAL | Status: DC

## 2013-11-26 NOTE — Progress Notes (Signed)
2D Echo Performed 11/26/2013    Carlethia Mesquita, RCS  

## 2013-11-26 NOTE — Telephone Encounter (Signed)
Pt. Came by and filled out a yellow sheet and requested a refill on his Exforge. Med refilled

## 2013-11-27 NOTE — Telephone Encounter (Signed)
Pt. Called and informed that his med was refilled yesterday

## 2013-11-27 NOTE — Telephone Encounter (Signed)
Please cal,pt called again today about his medicine.

## 2013-12-15 ENCOUNTER — Ambulatory Visit (INDEPENDENT_AMBULATORY_CARE_PROVIDER_SITE_OTHER): Payer: Medicare Other | Admitting: Cardiovascular Disease

## 2013-12-15 VITALS — BP 160/66 | HR 55 | Ht 68.0 in | Wt 155.4 lb

## 2013-12-15 DIAGNOSIS — E785 Hyperlipidemia, unspecified: Secondary | ICD-10-CM

## 2013-12-15 DIAGNOSIS — I359 Nonrheumatic aortic valve disorder, unspecified: Secondary | ICD-10-CM

## 2013-12-15 DIAGNOSIS — E119 Type 2 diabetes mellitus without complications: Secondary | ICD-10-CM

## 2013-12-15 DIAGNOSIS — I10 Essential (primary) hypertension: Secondary | ICD-10-CM

## 2013-12-15 DIAGNOSIS — I350 Nonrheumatic aortic (valve) stenosis: Secondary | ICD-10-CM

## 2013-12-15 MED ORDER — HYDROCHLOROTHIAZIDE 12.5 MG PO TABS: ORAL_TABLET | ORAL | Status: DC

## 2013-12-15 NOTE — Patient Instructions (Signed)
Your physician has recommended you make the following change in your medication: hydrochlorothiazide 12.5 mg was changed to 25 mg daily alternating with 12.5 mg daily. Take 2 capsules alternating with 1 capsule every other day. Until gone then start new prescription arriving in the mail as directed on the bottle. The new prescription WILL NOT BE A CAPSULE. It will be a tablet.   Your physician recommends that you return for lab work in: 2 weeks. This does not need to be fasting.  Your physician wants you to follow-up in: 6 monthsYou will receive a reminder letter in the mail two months in advance. If you don't receive a letter, please call our office to schedule the follow-up appointment.

## 2013-12-16 ENCOUNTER — Telehealth: Payer: Self-pay | Admitting: Cardiovascular Disease

## 2013-12-16 ENCOUNTER — Encounter: Payer: Self-pay | Admitting: Cardiovascular Disease

## 2013-12-16 NOTE — Telephone Encounter (Signed)
Juan Pham is calling again.to find out what to about the medication that Dr. Tresa Endo told him not to take . Please call   Thanks

## 2013-12-16 NOTE — Progress Notes (Signed)
Patient ID: GIANNI MIHALIK, male   DOB: 10/28/1930, 80 y.o.   MRN: [id number]      HPI: HENSON FRATICELLI is a 80 y.o. male who presents to the office today for 8 month cardiology evaluation.  Mr. Anastasia is 80 year old gentleman who has a history of moderate aortic valve stenosis, hypertension, type 2 diabetes mellitus, hyperlipidemia, and has remote history of bradycardia. He also is status post cataract and retinal surgery. An echo Doppler study in January 2014 showed normal systolic function with EF of 60-65% with grade 1 diastolic dysfunction. He had a mean transvalvular aortic gradient of 30 mm with a peak gradient of 47 mm, not significantly changed from one year ago and his gradient was 24 and 42 mm, respectively. He does have mitral annular calcification.   On 11/26/2013, Mr. Palazzi underwent a one-year followup echo Doppler study.  This showed an ejection fraction of 60-65%.  There was grade 1 diastolic dysfunction.  He can was noted to have mild-to-moderate aortic valve stenosis with moderate bili, calcified annulus and severely thickened leaflets.  The valve area is 1.19 cm with a mean gradient of 36 and peak gradient of 61 mm.  Over the past 8 months, Mr Littles has continued to do well. He specifically denies chest pain. He denies presyncope or syncope. He denies shortness of breath symptoms.  He denies PND, orthopnea. He  does note some mild leg swelling.  He presents for evaluation  Past Medical History  Diagnosis Date  . Hypertension   . Hyperlipidemia   . Bradycardia   . Diabetes mellitus without complication     type II  . Aortic stenosis, moderate 04/14/12    ECHO  EF 60-65%  ECHO-04/16/11 EF>55% Normal LV size and systolic function. Mildly dilated let atrium. Dystolic dysfunction, probably mild.    Past Surgical History  Procedure Laterality Date  . Cateract    . Retna      No Known Allergies  Current Outpatient Prescriptions  Medication Sig Dispense Refill  .  acetaminophen-codeine (TYLENOL #3) 300-30 MG per tablet Take 1 tablet by mouth as needed.      Marland Kitchen amLODipine-valsartan (EXFORGE) 10-320 MG per tablet Take 1 tablet by mouth daily.  90 tablet  3  . aspirin 81 MG tablet Take 81 mg by mouth daily.      . carvedilol (COREG) 12.5 MG tablet Take 0.5 tablets (6.25 mg total) by mouth 2 (two) times daily with a meal. 1/2 tablet  90 tablet  3  . fenofibrate micronized (LOFIBRA) 134 MG capsule Take 1 capsule (134 mg total) by mouth daily.  90 capsule  3  . insulin NPH-regular (NOVOLIN 70/30) (70-30) 100 UNIT/ML injection Inject into the skin 2 (two) times daily with a meal. 25 units in the AM and 5 units in the PM.      . meloxicam (MOBIC) 15 MG tablet Take 1 tablet by mouth daily.      Marland Kitchen omeprazole (PRILOSEC) 20 MG capsule Take 20 mg by mouth daily.      . simvastatin (ZOCOR) 20 MG tablet Take 20 mg by mouth daily.      . tamsulosin (FLOMAX) 0.4 MG CAPS Take by mouth daily.      . hydrochlorothiazide (HYDRODIURIL) 12.5 MG tablet Take 1 tablet alternating with 1/2 tablet daily  90 tablet  3   No current facility-administered medications for this visit.    Socially he is widowed has 3 children. There is a remote tobacco history  having quit greater than 20 years ago.  ROS General: Negative; No fevers, chills, or night sweats;  HEENT: Negative; No changes in vision or hearing, sinus congestion, difficulty swallowing Pulmonary: Negative; No cough, wheezing, shortness of breath, hemoptysis Cardiovascular: See history of present illness; No chest pain, presyncope, syncope, palpitations Positive for leg swelling GI: Positive for GERD; No nausea, vomiting, diarrhea, or abdominal pain GU: Negative; No dysuria, hematuria, or difficulty voiding Musculoskeletal: Negative; no myalgias, joint pain, or weakness Hematologic/Oncology: Negative; no easy bruising, bleeding Endocrine: Positive for type 2 diabetes mellitus Neuro: Negative; no changes in balance,  headaches Skin: Negative; No rashes or skin lesions Psychiatric: Negative; No behavioral problems, depression Sleep: Negative; No snoring, daytime sleepiness, hypersomnolence, bruxism, restless legs, hypnogognic hallucinations, no cataplexy Other comprehensive 14 point system review is negative. ROS is negative for fevers, chills or night sweats. He denies change in vision or hearing. He denies headaches. He is unaware of lymphadenopathy. He denies cough wheezing or sputum production. He denies palpitations. He denies presyncope or syncope. He denies PND orthopnea. He denies chest pressure. He denies abdominal pain. He denies nausea vomiting or diarrhea. There is no change in bowel or bladder habits. He denies blood in stool or urine He denies bleeding. He has mild leg swelling. He does have diabetes. He denies endocrine problems. He does note some musculoskeletal joint discomfort. He denies change in sleep pattern. Other comprehensive 14 point system review is negative.  PE BP 160/66  Pulse 55  Ht _0  (1.727 m)  Wt 155 lb 6.4 oz (70.489 kg)  BMI 23.63 kg/m2  Repeat blood pressure meds when checked by me was 128/70 General: Alert, oriented, no distress.  Skin: normal turgor, no rashes HEENT: Normocephalic, atraumatic. Pupils round and reactive; sclera anicteric;no lid lag.  Nose without nasal septal hypertrophy; small aperture to the left nares compared to the right. Mouth/Parynx benign; Mallinpatti scale 2 Neck: No JVD, he has transmitted murmurs to his carotids bilaterally. Lungs: clear to ausculatation and percussion; no wheezing or rales Chest wall: No tenderness to palpation Heart: RRR, s1 s2 normal; 2/6 systolic murmur and a whiff of aortic insufficiency.  No S3 gallop.  No diastolic rub thrills or heaves Abdomen: soft, nontender; no hepatosplenomehaly, BS+; abdominal aorta nontender and not dilated by palpation. Back: No CVA tenderness Pulses 2+ Extremities: Moderate right lower  extremity varicosities. Mild left lower extremity varicosity; mild pedal edema no clubbing cyanosis, Homan's sign negative  Neurologic: grossly nonfocal; cranial nerves grossly normal. Psychological: Affect flat.  ECG (and apparently read by me): Sinus bradycardia 55 beats per minute.  No ectopy.  Normal intervals.  04/21/2013 ECG (independently read by me): Normal sinus rhythm at 60 beats per minute;incomplete right bundle branch block. Normal intervals.  ECG from July 2014: Sinus rhythm at 53 beats per minute. QTc interval 405 msec.  LABS:  BMET    Component Value Date/Time   NA 138 06/08/2009 1852   K 4.4 06/08/2009 1852   CL 108 06/08/2009 1852   CO2 24 04/12/2009 0217   GLUCOSE 223* 06/08/2009 1852   BUN 25* 06/08/2009 1852   CREATININE 1.2 06/08/2009 1852   CALCIUM 8.8 04/12/2009 0217   GFRNONAA 52* 04/12/2009 0217   GFRAA  Value: >60        The eGFR has been calculated using the MDRD equation. This calculation has not been validated in all clinical situations. eGFR's persistently <60 mL/min signify possible Chronic Kidney Disease. 04/12/2009 0217     Hepatic Function  Panel     Component Value Date/Time   PROT 6.8 04/12/2009 0217   ALBUMIN 3.6 04/12/2009 0217   AST 16 04/12/2009 0217   ALT 14 04/12/2009 0217   ALKPHOS 22* 04/12/2009 0217   BILITOT 0.6 04/12/2009 0217     CBC    Component Value Date/Time   WBC 8.3 04/12/2009 0217   RBC 4.24 04/12/2009 0217   HGB 13.3 06/08/2009 1852   HCT 39.0 06/08/2009 1852   PLT 317 04/12/2009 0217   MCV 88.8 04/12/2009 0217   MCHC 33.9 04/12/2009 0217   RDW 13.8 04/12/2009 0217   LYMPHSABS 2.9 04/12/2009 0217   MONOABS 0.9 04/12/2009 0217   EOSABS 0.3 04/12/2009 0217   BASOSABS 0.1 04/12/2009 0217     BNP No results found for this basename: probnp    Lipid Panel     Component Value Date/Time   CHOL  Value: 121        ATP III CLASSIFICATION:  <200     mg/dL   Desirable  200-239  mg/dL   Borderline High  >=240    mg/dL   High        04/12/2009 0218   TRIG 118 04/12/2009  0218   HDL 28* 04/12/2009 0218   CHOLHDL 4.3 04/12/2009 0218   VLDL 24 04/12/2009 0218   LDLCALC  Value: 69        Total Cholesterol/HDL:CHD Risk Coronary Heart Disease Risk Table                     Men   Women  1/2 Average Risk   3.4   3.3  Average Risk       5.0   4.4  2 X Average Risk   9.6   7.1  3 X Average Risk  23.4   11.0        Use the calculated Patient Ratio above and the CHD Risk Table to determine the patient's CHD Risk.        ATP III CLASSIFICATION (LDL):  <100     mg/dL   Optimal  100-129  mg/dL   Near or Above                    Optimal  130-159  mg/dL   Borderline  160-189  mg/dL   High  >190     mg/dL   Very High 04/12/2009 0218     RADIOLOGY: No results found.    ASSESSMENT AND PLAN: Mr. Henney is an 5 years who has a history of hypertension, hyperlipidemia, type 2 diabetes mellitus, as well as documented moderate aortic valve stenosis. An echo Doppler study January 2014 revealed a mean transvalvular aortic gradient of 30 mm with a peak gradient of 47 mm and a calculated aortic valve area 1.1 cm.  I reviewed his most recent echo from August 2015.  This continues to show mild to moderate aortic stenosis with a mean gradient now.  It 36 and peak gradient of 61 mm.  LV function remains normal.  There is grade 1 diastolic dysfunction.  He continues to be relatively symptom-free with reference to his aortic stenosis. His blood pressure today was elevated at 160/70.  When rechecked by me.  He does have mild ankle edema.  Unchanged his hydrochlorothiazide from 12.5 mg daily to 25 mg alternating with 12.5 mg every other day.  We will obtain a followup be met in 2 weeks to reassess electrolytes and renal  function.  He'll continue his current dose of Exforge, consisting of 10 mg of amlodipine and 320 mg of valsartan in addition to his carvedilol at 6.25 mg twice a day.  He is bradycardic and therefore, will not further increase his beta blockade.  With reference to his lipids, he is on simvastatin  20 mg in addition to fenofibrate 134 mg.  I will see him in 6 months for reevaluation or sooner if problems arise.  Troy Sine, MD, [facility]  12/16/2013 3:53 PM

## 2013-12-16 NOTE — Telephone Encounter (Signed)
Pt said he was here yesterday to see Dr Tresa Endo. He was told to call back today and talk to Loraine about his medicine.

## 2013-12-17 NOTE — Telephone Encounter (Signed)
Left message for patient call returned. He can call again if assistance still needed.

## 2014-01-11 LAB — BASIC METABOLIC PANEL
BUN: 35 mg/dL — AB (ref 6–23)
CHLORIDE: 102 meq/L (ref 96–112)
CO2: 24 mEq/L (ref 19–32)
CREATININE: 1.76 mg/dL — AB (ref 0.50–1.35)
Calcium: 9.3 mg/dL (ref 8.4–10.5)
Glucose, Bld: 246 mg/dL — ABNORMAL HIGH (ref 70–99)
Potassium: 4.9 mEq/L (ref 3.5–5.3)
Sodium: 131 mEq/L — ABNORMAL LOW (ref 135–145)

## 2014-01-14 ENCOUNTER — Telehealth: Payer: Self-pay | Admitting: *Deleted

## 2014-01-14 NOTE — Telephone Encounter (Signed)
Message copied by Gaynelle CageWADDELL, WANDA M. on Thu Jan 14, 2014  4:52 PM ------      Message from: Nicki GuadalajaraKELLY, THOMAS A      Created: Thu Jan 14, 2014 11:45 AM       Cr inc; DC meloxicam; decrease HCTZ; Needs better glucose control ------

## 2014-01-18 ENCOUNTER — Telehealth: Payer: Self-pay | Admitting: Cardiovascular Disease

## 2014-01-18 ENCOUNTER — Other Ambulatory Visit: Payer: Self-pay | Admitting: *Deleted

## 2014-01-18 NOTE — Telephone Encounter (Signed)
Pt is returning Wanda's call in reference to his lab results  Thanks

## 2014-01-18 NOTE — Telephone Encounter (Signed)
Spoke with patient to discuss his lab results and recommendations. Informed patient of medication changes that Dr. Tresa EndoKelly wants him to make. Patient was very confused and did not really understand which medications that I was referring to. Recommended to patient to come by the office today. Bring all of his medicine and i will go over them with him.

## 2014-01-18 NOTE — Telephone Encounter (Signed)
Message copied by Gaynelle CageWADDELL, WANDA M. on Mon Jan 18, 2014  8:56 AM ------      Message from: Nicki GuadalajaraKELLY, THOMAS A      Created: Thu Jan 14, 2014 11:45 AM       Cr inc; DC meloxicam; decrease HCTZ; Needs better glucose control ------

## 2014-01-19 ENCOUNTER — Emergency Department (HOSPITAL_COMMUNITY)
Admission: EM | Admit: 2014-01-19 | Discharge: 2014-01-20 | Disposition: A | Discharge status: Home / Self Care | Payer: Medicare Other | Attending: Emergency Medicine | Admitting: Emergency Medicine

## 2014-01-19 ENCOUNTER — Encounter (HOSPITAL_COMMUNITY): Payer: Self-pay | Admitting: Emergency Medicine

## 2014-01-19 ENCOUNTER — Emergency Department (HOSPITAL_COMMUNITY): Payer: Medicare Other

## 2014-01-19 DIAGNOSIS — I10 Essential (primary) hypertension: Secondary | ICD-10-CM | POA: Insufficient documentation

## 2014-01-19 DIAGNOSIS — R011 Cardiac murmur, unspecified: Secondary | ICD-10-CM | POA: Diagnosis not present

## 2014-01-19 DIAGNOSIS — E119 Type 2 diabetes mellitus without complications: Secondary | ICD-10-CM | POA: Diagnosis not present

## 2014-01-19 DIAGNOSIS — R51 Headache: Secondary | ICD-10-CM | POA: Diagnosis not present

## 2014-01-19 DIAGNOSIS — E785 Hyperlipidemia, unspecified: Secondary | ICD-10-CM | POA: Insufficient documentation

## 2014-01-19 DIAGNOSIS — Z7982 Long term (current) use of aspirin: Secondary | ICD-10-CM | POA: Diagnosis not present

## 2014-01-19 DIAGNOSIS — R519 Headache, unspecified: Secondary | ICD-10-CM

## 2014-01-19 DIAGNOSIS — M542 Cervicalgia: Secondary | ICD-10-CM | POA: Diagnosis not present

## 2014-01-19 DIAGNOSIS — Z87891 Personal history of nicotine dependence: Secondary | ICD-10-CM | POA: Insufficient documentation

## 2014-01-19 DIAGNOSIS — Z794 Long term (current) use of insulin: Secondary | ICD-10-CM | POA: Insufficient documentation

## 2014-01-19 MED ORDER — MORPHINE SULFATE 2 MG/ML IJ SOLN
2.0000 mg | Freq: Once | INTRAMUSCULAR | Status: AC
  Administered 2014-01-20: 2 mg via INTRAVENOUS
  Filled 2014-01-19: qty 1

## 2014-01-19 NOTE — ED Provider Notes (Signed)
CSN: [id number]     Arrival date & time 01/19/14  2315 History  This chart was scribed for Juan Lyonsouglas Shatiqua Heroux, MD by Milly JakobJohn Lee Graves, ED Scribe. The patient was seen in room APA06/APA06. Patient's care was started at 11:37 PM.     Chief Complaint  Patient presents with  . Headache   Patient is a 80 y.o. male presenting with headaches. The history is provided by the patient. No language interpreter was used.  Headache Quality:  Sharp Radiates to:  L neck and R neck Severity currently:  6/10 Severity at highest:  8/10 Onset quality:  Gradual Duration:  14 hours Timing:  Constant Progression:  Unchanged Chronicity:  New Similar to prior headaches: no   Relieved by:  Nothing Worsened by:  Neck movement Ineffective treatments:  NSAIDs Associated symptoms: neck pain   Associated symptoms: no vomiting    HPI Comments: Juan Pham is a 80 y.o. male who presents to the Emergency Department complaining of constant pain in the back of his neck which began 2 days ago and a severe, constant, headache onset this morning. He reports associated dizziness which began this morning. He reports taking Tylenol 3 and Ibuprofen with minimal relief. He denies fever, blurred vision, numbness, or tingling.   Past Medical History  Diagnosis Date  . Hypertension   . Hyperlipidemia   . Bradycardia   . Diabetes mellitus without complication     type II  . Aortic stenosis, moderate 04/14/12    ECHO  EF 60-65%  ECHO-04/16/11 EF>55% Normal LV size and systolic function. Mildly dilated let atrium. Dystolic dysfunction, probably mild.   Past Surgical History  Procedure Laterality Date  . Cateract    . Retna     Family History  Problem Relation Age of Onset  . Heart disease Mother    History  Substance Use Topics  . Smoking status: Former Smoker    Types: Pipe    Quit date: 10/21/1982  . Smokeless tobacco: Never Used  . Alcohol Use: No    Review of Systems  Gastrointestinal: Negative for vomiting.   Musculoskeletal: Positive for neck pain.  Neurological: Positive for headaches.  A complete 10 system review of systems was obtained and all systems are negative except as noted in the HPI and PMH.   Allergies  Review of patient's allergies indicates no known allergies.  Home Medications   Prior to Admission medications   Medication Sig Start Date End Date Taking? Authorizing Provider  acetaminophen-codeine (TYLENOL #3) 300-30 MG per tablet Take 1 tablet by mouth as needed. 10/01/12   Historical Provider, MD  amLODipine-valsartan (EXFORGE) 10-320 MG per tablet Take 1 tablet by mouth daily. 11/26/13   Lennette Biharihomas A Kelly, MD  aspirin 81 MG tablet Take 81 mg by mouth daily.    Historical Provider, MD  carvedilol (COREG) 12.5 MG tablet Take 0.5 tablets (6.25 mg total) by mouth 2 (two) times daily with a meal. 1/2 tablet 03/25/13   Lennette Biharihomas A Kelly, MD  fenofibrate micronized (LOFIBRA) 134 MG capsule Take 1 capsule (134 mg total) by mouth daily. 03/25/13   Lennette Biharihomas A Kelly, MD  hydrochlorothiazide (HYDRODIURIL) 12.5 MG tablet Take 12.5 mg by mouth daily. 12/15/13   Lennette Biharihomas A Kelly, MD  insulin NPH-regular (NOVOLIN 70/30) (70-30) 100 UNIT/ML injection Inject into the skin 2 (two) times daily with a meal. 25 units in the AM and 5 units in the PM.    Historical Provider, MD  omeprazole (PRILOSEC) 20 MG capsule Take  20 mg by mouth daily.    Historical Provider, MD  simvastatin (ZOCOR) 20 MG tablet Take 20 mg by mouth daily.    Historical Provider, MD  tamsulosin (FLOMAX) 0.4 MG CAPS Take by mouth daily.    Historical Provider, MD   Triage Vitals: BP 172/52  Pulse 58  Temp(Src) 98.1 F (36.7 C) (Oral)  Resp 17  Wt 155 lb (70.308 kg)  SpO2 98% Physical Exam  Nursing note and vitals reviewed. Constitutional: He is oriented to person, place, and time. He appears well-developed and well-nourished.  HENT:  Head: Normocephalic and atraumatic.  Eyes: Conjunctivae and EOM are normal. Pupils are equal, round,  and reactive to light.  Neck: Normal range of motion. Neck supple.  Cardiovascular: Normal rate and regular rhythm.   Murmur heard. There is a 3 out of 6 SEM heard best at the left lower sternal boarder.  Pulmonary/Chest: Effort normal and breath sounds normal.  Abdominal: Soft. Bowel sounds are normal.  Musculoskeletal: Normal range of motion.  Neurological: He is alert and oriented to person, place, and time. No cranial nerve deficit. He exhibits normal muscle tone. Coordination normal.  Skin: Skin is warm and dry.  Psychiatric: He has a normal mood and affect. His behavior is normal.    ED Course  Procedures (including critical care time) DIAGNOSTIC STUDIES: Oxygen Saturation is 98% on room air, normal by my interpretation.    COORDINATION OF CARE: 11:40 PM-Discussed treatment plan which includes head and neck CT-scan, pain medication, and lab work with pt at bedside and pt agreed to plan.   Labs Review Labs Reviewed - No data to display  Imaging Review No results found.   EKG Interpretation None      MDM   Final diagnoses:  None    Patient presents with complaints of headache and upper neck pain in the absence of injury or trauma. Has a history of a C2 fracture in the past and diabetes. Neurologic exam is nonfocal and the patient is nontoxic appearing. CT scan of the head and cervical spine are unremarkable. He was given pain medication and is feeling somewhat better. At this point I feel as though he is appropriate for discharge. I will prescribe pain medication and advised him to followup as needed.  I personally performed the services described in this documentation, which was scribed in my presence. The recorded information has been reviewed and is accurate.      Juan Lyonsouglas Quanna Wittke, MD 01/20/14 [phone]

## 2014-01-19 NOTE — ED Notes (Addendum)
Pt reports onset of dizziness this morning, progressing to headache throughout the day. Pt denies changes in vision or N/V and pt has bilateral, strong handgrips. Pt alert to age/location, but not oriented to month/time.

## 2014-01-20 LAB — COMPREHENSIVE METABOLIC PANEL
ALBUMIN: 3.9 g/dL (ref 3.5–5.2)
ALT: 12 U/L (ref 0–53)
ANION GAP: 13 (ref 5–15)
AST: 16 U/L (ref 0–37)
Alkaline Phosphatase: 25 U/L — ABNORMAL LOW (ref 39–117)
BUN: 37 mg/dL — AB (ref 6–23)
CO2: 22 mEq/L (ref 19–32)
Calcium: 9.1 mg/dL (ref 8.4–10.5)
Chloride: 101 mEq/L (ref 96–112)
Creatinine, Ser: 1.45 mg/dL — ABNORMAL HIGH (ref 0.50–1.35)
GFR calc Af Amer: 51 mL/min — ABNORMAL LOW (ref >90)
GFR calc non Af Amer: 44 mL/min — ABNORMAL LOW (ref >90)
Glucose, Bld: 171 mg/dL — ABNORMAL HIGH (ref 70–99)
Potassium: 4.4 mEq/L (ref 3.7–5.3)
Sodium: 136 mEq/L — ABNORMAL LOW (ref 137–147)
TOTAL PROTEIN: 7.1 g/dL (ref 6.0–8.3)
Total Bilirubin: 0.3 mg/dL (ref 0.3–1.2)

## 2014-01-20 LAB — CBC WITH DIFFERENTIAL/PLATELET
BASOS PCT: 1 % (ref 0–1)
Basophils Absolute: 0.1 10*3/uL (ref 0.0–0.1)
EOS ABS: 0.2 10*3/uL (ref 0.0–0.7)
Eosinophils Relative: 3 % (ref 0–5)
HCT: 33.9 % — ABNORMAL LOW (ref 39.0–52.0)
HEMOGLOBIN: 11.6 g/dL — AB (ref 13.0–17.0)
Lymphocytes Relative: 40 % (ref 12–46)
Lymphs Abs: 2.4 10*3/uL (ref 0.7–4.0)
MCH: 30.4 pg (ref 26.0–34.0)
MCHC: 34.2 g/dL (ref 30.0–36.0)
MCV: 89 fL (ref 78.0–100.0)
MONOS PCT: 10 % (ref 3–12)
Monocytes Absolute: 0.6 10*3/uL (ref 0.1–1.0)
NEUTROS PCT: 46 % (ref 43–77)
Neutro Abs: 2.9 10*3/uL (ref 1.7–7.7)
Platelets: 330 10*3/uL (ref 150–400)
RBC: 3.81 MIL/uL — ABNORMAL LOW (ref 4.22–5.81)
RDW: 14.1 % (ref 11.5–15.5)
WBC: 6.1 10*3/uL (ref 4.0–10.5)

## 2014-01-20 MED ORDER — HYDROCODONE-ACETAMINOPHEN 5-325 MG PO TABS: 1.0000 | ORAL_TABLET | Freq: Four times a day (QID) | ORAL | Status: DC | PRN

## 2014-01-20 MED ORDER — MORPHINE SULFATE 2 MG/ML IJ SOLN
2.0000 mg | Freq: Once | INTRAMUSCULAR | Status: AC
  Administered 2014-01-20: 2 mg via INTRAVENOUS
  Filled 2014-01-20: qty 1

## 2014-01-20 NOTE — Discharge Instructions (Signed)
Hydrocodone as prescribed as needed for pain.  Return to the emergency department if you develop high fever, worsening pain, or other new and concerning symptoms.   General Headache Without Cause A headache is pain or discomfort felt around the head or neck area. The specific cause of a headache may not be found. There are many causes and types of headaches. A few common ones are:  Tension headaches.  Migraine headaches.  Cluster headaches.  Chronic daily headaches. HOME CARE INSTRUCTIONS   Keep all follow-up appointments with your caregiver or any specialist referral.  Only take over-the-counter or prescription medicines for pain or discomfort as directed by your caregiver.  Lie down in a dark, quiet room when you have a headache.  Keep a headache journal to find out what may trigger your migraine headaches. For example, write down:  What you eat and drink.  How much sleep you get.  Any change to your diet or medicines.  Try massage or other relaxation techniques.  Put ice packs or heat on the head and neck. Use these 3 to 4 times per day for 15 to 20 minutes each time, or as needed.  Limit stress.  Sit up straight, and do not tense your muscles.  Quit smoking if you smoke.  Limit alcohol use.  Decrease the amount of caffeine you drink, or stop drinking caffeine.  Eat and sleep on a regular schedule.  Get 7 to 9 hours of sleep, or as recommended by your caregiver.  Keep lights dim if bright lights bother you and make your headaches worse. SEEK MEDICAL CARE IF:   You have problems with the medicines you were prescribed.  Your medicines are not working.  You have a change from the usual headache.  You have nausea or vomiting. SEEK IMMEDIATE MEDICAL CARE IF:   Your headache becomes severe.  You have a fever.  You have a stiff neck.  You have loss of vision.  You have muscular weakness or loss of muscle control.  You start losing your balance or  have trouble walking.  You feel faint or pass out.  You have severe symptoms that are different from your first symptoms. MAKE SURE YOU:   Understand these instructions.  Will watch your condition.  Will get help right away if you are not doing well or get worse. Document Released: 03/26/2005 Document Revised: 06/18/2011 Document Reviewed: 04/11/2011 [facility] Patient Information 2015 [employer] [place]. This information is not intended to replace advice given to you by your health care provider. Make sure you discuss any questions you have with your health care provider.

## 2014-02-01 ENCOUNTER — Other Ambulatory Visit: Payer: Self-pay | Admitting: *Deleted

## 2014-02-01 MED ORDER — CARVEDILOL 12.5 MG PO TABS: 6.2500 mg | ORAL_TABLET | Freq: Two times a day (BID) | ORAL | Status: DC

## 2014-02-01 NOTE — Telephone Encounter (Signed)
Refilled electronically 

## 2014-02-04 ENCOUNTER — Other Ambulatory Visit: Payer: Self-pay

## 2014-02-04 ENCOUNTER — Telehealth: Payer: Self-pay | Admitting: Cardiovascular Disease

## 2014-02-04 MED ORDER — HYDROCHLOROTHIAZIDE 12.5 MG PO TABS: 12.5000 mg | ORAL_TABLET | Freq: Every day | ORAL | Status: DC

## 2014-02-04 NOTE — Telephone Encounter (Signed)
Please call,having some medicine problems.

## 2014-02-04 NOTE — Telephone Encounter (Signed)
Rx sent to pharmacy   

## 2014-02-04 NOTE — Telephone Encounter (Signed)
I called this pt. And he told me he only wanted to talk to wanda, wanda informed

## 2014-02-04 NOTE — Telephone Encounter (Signed)
LMTCB

## 2014-03-16 ENCOUNTER — Encounter: Payer: Self-pay | Admitting: *Deleted

## 2014-03-16 ENCOUNTER — Telehealth: Payer: Self-pay | Admitting: Neurology

## 2014-03-16 ENCOUNTER — Ambulatory Visit: Payer: Medicare Other | Admitting: Neurology

## 2014-03-16 NOTE — Telephone Encounter (Signed)
This patient did not show for a new patient appointment today. 

## 2014-03-22 ENCOUNTER — Other Ambulatory Visit: Payer: Self-pay | Admitting: Cardiovascular Disease

## 2014-03-22 NOTE — Telephone Encounter (Signed)
Rx has been sent to the pharmacy electronically. ° °

## 2014-06-16 ENCOUNTER — Emergency Department (HOSPITAL_COMMUNITY): Payer: Medicare Other

## 2014-06-16 ENCOUNTER — Emergency Department (HOSPITAL_COMMUNITY)
Admission: EM | Admit: 2014-06-16 | Discharge: 2014-06-16 | Disposition: A | Discharge status: Home / Self Care | Payer: Medicare Other | Attending: Emergency Medicine | Admitting: Emergency Medicine

## 2014-06-16 ENCOUNTER — Encounter (HOSPITAL_COMMUNITY): Payer: Self-pay

## 2014-06-16 DIAGNOSIS — Z79899 Other long term (current) drug therapy: Secondary | ICD-10-CM | POA: Diagnosis not present

## 2014-06-16 DIAGNOSIS — R739 Hyperglycemia, unspecified: Secondary | ICD-10-CM

## 2014-06-16 DIAGNOSIS — Z794 Long term (current) use of insulin: Secondary | ICD-10-CM | POA: Insufficient documentation

## 2014-06-16 DIAGNOSIS — R4182 Altered mental status, unspecified: Secondary | ICD-10-CM | POA: Insufficient documentation

## 2014-06-16 DIAGNOSIS — E1165 Type 2 diabetes mellitus with hyperglycemia: Secondary | ICD-10-CM | POA: Insufficient documentation

## 2014-06-16 DIAGNOSIS — E785 Hyperlipidemia, unspecified: Secondary | ICD-10-CM | POA: Diagnosis not present

## 2014-06-16 DIAGNOSIS — Z7982 Long term (current) use of aspirin: Secondary | ICD-10-CM | POA: Insufficient documentation

## 2014-06-16 DIAGNOSIS — R011 Cardiac murmur, unspecified: Secondary | ICD-10-CM | POA: Diagnosis not present

## 2014-06-16 DIAGNOSIS — I10 Essential (primary) hypertension: Secondary | ICD-10-CM | POA: Diagnosis not present

## 2014-06-16 LAB — CBC WITH DIFFERENTIAL/PLATELET
BASOS ABS: 0.1 10*3/uL (ref 0.0–0.1)
BASOS PCT: 1 % (ref 0–1)
EOS PCT: 2 % (ref 0–5)
Eosinophils Absolute: 0.2 10*3/uL (ref 0.0–0.7)
HEMATOCRIT: 37.6 % — AB (ref 39.0–52.0)
Hemoglobin: 12.5 g/dL — ABNORMAL LOW (ref 13.0–17.0)
Lymphocytes Relative: 23 % (ref 12–46)
Lymphs Abs: 1.9 10*3/uL (ref 0.7–4.0)
MCH: 29.8 pg (ref 26.0–34.0)
MCHC: 33.2 g/dL (ref 30.0–36.0)
MCV: 89.5 fL (ref 78.0–100.0)
Monocytes Absolute: 0.7 10*3/uL (ref 0.1–1.0)
Monocytes Relative: 8 % (ref 3–12)
Neutro Abs: 5.5 10*3/uL (ref 1.7–7.7)
Neutrophils Relative %: 66 % (ref 43–77)
Platelets: 386 10*3/uL (ref 150–400)
RBC: 4.2 MIL/uL — ABNORMAL LOW (ref 4.22–5.81)
RDW: 13.2 % (ref 11.5–15.5)
WBC: 8.2 10*3/uL (ref 4.0–10.5)

## 2014-06-16 LAB — TROPONIN I: Troponin I: <0.03 ng/mL (ref <0.031)

## 2014-06-16 LAB — URINALYSIS, ROUTINE W REFLEX MICROSCOPIC
Bilirubin Urine: NEGATIVE
Glucose, UA: >1000 mg/dL — AB
Ketones, ur: NEGATIVE mg/dL
Leukocytes, UA: NEGATIVE
Nitrite: NEGATIVE
PH: 7 (ref 5.0–8.0)
Specific Gravity, Urine: 1.015 (ref 1.005–1.030)
Urobilinogen, UA: 0.2 mg/dL (ref 0.0–1.0)

## 2014-06-16 LAB — COMPREHENSIVE METABOLIC PANEL
ALBUMIN: 4.3 g/dL (ref 3.5–5.2)
ALT: 15 U/L (ref 0–53)
ANION GAP: 8 (ref 5–15)
AST: 19 U/L (ref 0–37)
Alkaline Phosphatase: 29 U/L — ABNORMAL LOW (ref 39–117)
BILIRUBIN TOTAL: 0.7 mg/dL (ref 0.3–1.2)
BUN: 34 mg/dL — AB (ref 6–23)
CO2: 24 mmol/L (ref 19–32)
Calcium: 9.2 mg/dL (ref 8.4–10.5)
Chloride: 103 mmol/L (ref 96–112)
Creatinine, Ser: 1.32 mg/dL (ref 0.50–1.35)
GFR calc Af Amer: 57 mL/min — ABNORMAL LOW (ref >90)
GFR, EST NON AFRICAN AMERICAN: 49 mL/min — AB (ref >90)
GLUCOSE: 244 mg/dL — AB (ref 70–99)
Potassium: 4.1 mmol/L (ref 3.5–5.1)
SODIUM: 135 mmol/L (ref 135–145)
Total Protein: 7.2 g/dL (ref 6.0–8.3)

## 2014-06-16 LAB — URINE MICROSCOPIC-ADD ON

## 2014-06-16 LAB — CBG MONITORING, ED: Glucose-Capillary: 215 mg/dL — ABNORMAL HIGH (ref 70–99)

## 2014-06-16 NOTE — ED Notes (Signed)
Family reports confusion and blood sugar fluctuating for the past 3 or 4 days.  Reports recently was taken off of insulin and started on pills.  Reports pills alone weren't working so they started him on insulin and pills.  Pt denies any pain.  C/O generalized weakness.

## 2014-06-16 NOTE — ED Provider Notes (Signed)
CSN: [id number]     Arrival date & time 06/16/14  1318 History  This chart was scribed for Eber Hong, MD by Luisa Dago, ED Scribe. This patient was seen in room APA04/APA04 and the patient's care was started at 2:39 PM.    Chief Complaint  Patient presents with  . Altered Mental Status   The history is provided by the patient, a relative and medical records. No language interpreter was used.   Level 5 Caveat- Altered Mental Status  HPI Comments: Juan Pham is a 81 y.o. male who presents to the Emergency Department with family complaining of altered mental status with confusion that started about a month ago. Daughter endorses some mild agitation when he has his episodes of confusion.   Daughter states that the pt's sugar has been fluctuating and has not been controlled. He was on insulin shots but that regiment was terminated and he was prescribed insulin po. However, this proposed new regiment had not been controlling the pt's blood sugar. She states that ever since then the pt has not been able to take his blood sugar by himself and has been experiencing increased confusion. Daughter states that the pt has been blurting things out that she cannot understand and earlier today he could not recall that he had talked with his daughther 20 minutes prior to her arrival at home. Pt had an appointment with his PCP, Juan Smiles., MD, but he advised that pt be evaluated here in the ED. Daughter is also complaining of associated diaphoresis and more than normal secretion after eating. She denies any new medication.      Past Medical History  Diagnosis Date  . Hypertension   . Hyperlipidemia   . Bradycardia   . Diabetes mellitus without complication     type II  . Aortic stenosis, moderate 04/14/12    ECHO  EF 60-65%  ECHO-04/16/11 EF>55% Normal LV size and systolic function. Mildly dilated let atrium. Dystolic dysfunction, probably mild.   Past Surgical History  Procedure Laterality  Date  . Cateract    . Retna     Family History  Problem Relation Age of Onset  . Heart disease Mother    History  Substance Use Topics  . Smoking status: Former Smoker    Types: Pipe    Quit date: 10/21/1982  . Smokeless tobacco: Never Used  . Alcohol Use: No    Review of Systems  Unable to perform ROS: Mental status change    Allergies  Review of patient's allergies indicates no known allergies.  Home Medications   Prior to Admission medications   Medication Sig Start Date End Date Taking? Authorizing Provider  amLODipine (NORVASC) 10 MG tablet Take 10 mg by mouth daily.   Yes Historical Provider, MD  donepezil (ARICEPT) 5 MG tablet Take 5 mg by mouth at bedtime.   Yes Historical Provider, MD  fenofibrate micronized (LOFIBRA) 134 MG capsule TAKE 1 BY MOUTH DAILY 03/22/14  Yes Lennette Bihari, MD  glipiZIDE (GLUCOTROL) 5 MG tablet Take 20 mg by mouth daily before breakfast.   Yes Historical Provider, MD  HYDROcodone-acetaminophen (NORCO) 7.5-325 MG per tablet Take 1 tablet by mouth 2 (two) times daily as needed for moderate pain.   Yes Historical Provider, MD  insulin aspart (NOVOLOG) 100 UNIT/ML injection Inject into the skin 3 (three) times daily before meals.   Yes Historical Provider, MD  omeprazole (PRILOSEC) 20 MG capsule Take 20 mg by mouth daily.   Yes Historical  Provider, MD  simvastatin (ZOCOR) 20 MG tablet Take 20 mg by mouth daily.   Yes Historical Provider, MD  tamsulosin (FLOMAX) 0.4 MG CAPS Take by mouth daily.   Yes Historical Provider, MD  amLODipine-valsartan (EXFORGE) 10-320 MG per tablet Take 1 tablet by mouth daily. 11/26/13   Lennette Bihari, MD  aspirin 81 MG tablet Take 81 mg by mouth daily.    Historical Provider, MD  carvedilol (COREG) 12.5 MG tablet Take 0.5 tablets (6.25 mg total) by mouth 2 (two) times daily with a meal. 1/2 tablet 02/01/14   Lennette Bihari, MD  hydrochlorothiazide (HYDRODIURIL) 12.5 MG tablet Take 1 tablet (12.5 mg total) by mouth  daily. 02/04/14   Lennette Bihari, MD  HYDROcodone-acetaminophen (NORCO) 5-325 MG per tablet Take 1-2 tablets by mouth every 6 (six) hours as needed. Patient not taking: Reported on 06/16/2014 01/20/14   Geoffery Lyons, MD  insulin NPH-regular (NOVOLIN 70/30) (70-30) 100 UNIT/ML injection Inject into the skin 2 (two) times daily with a meal. 25 units in the AM and 5 units in the PM.    Historical Provider, MD   BP 179/56 mmHg  Pulse 64  Temp(Src) 98.4 F (36.9 C) (Oral)  Resp 18  Ht  (1.778 m)  Wt 160 lb (72.576 kg)  BMI 22.96 kg/m2  SpO2 96%  Physical Exam  Constitutional: He appears well-developed and well-nourished. No distress.  HENT:  Head: Normocephalic and atraumatic.  Mouth/Throat: Oropharynx is clear and moist. No oropharyngeal exudate.  Eyes: Conjunctivae and EOM are normal. Pupils are equal, round, and reactive to light. Right eye exhibits no discharge. Left eye exhibits no discharge. No scleral icterus.  Neck: Normal range of motion. Neck supple. No JVD present. No thyromegaly present.  Cardiovascular: Normal rate, regular rhythm and intact distal pulses.  Exam reveals no gallop and no friction rub.   Murmur heard. Loud systolic murmur  Pulmonary/Chest: Effort normal and breath sounds normal. No respiratory distress. He has no wheezes. He has no rales.  Abdominal: Soft. Bowel sounds are normal. He exhibits no distension and no mass. There is no tenderness.  Direct inguinal hernia nontender and very soft with no overlying redness.   Genitourinary:  Normal appearing genitals.   Musculoskeletal: Normal range of motion. He exhibits no edema or tenderness.  1 + pitting edema to bilateral lower extremities.  Lymphadenopathy:    He has no cervical adenopathy.  Neurological: He is alert. Coordination normal.  Follows commands. Clear speech; no weakness; cranial nerves 3-12 intact and normal coordination  Skin: Skin is warm and dry. No rash noted. No erythema.  Psychiatric:  He has a normal mood and affect. His behavior is normal.  Nursing note and vitals reviewed.   ED Course  Procedures (including critical care time)  DIAGNOSTIC STUDIES: Oxygen Saturation is 96% on RA, adequate by my interpretation.    COORDINATION OF CARE: 2:48 PM- Left inguinal hernia reduced during PE. Pt and family advised of plan for treatment and they agree.  Labs Review Labs Reviewed  COMPREHENSIVE METABOLIC PANEL - Abnormal; Notable for the following:    Glucose, Bld 244 (*)    BUN 34 (*)    Alkaline Phosphatase 29 (*)    GFR calc non Af Amer 49 (*)    GFR calc Af Amer 57 (*)    All other components within normal limits  CBC WITH DIFFERENTIAL/PLATELET - Abnormal; Notable for the following:    RBC 4.20 (*)    Hemoglobin 12.5 (*)  HCT 37.6 (*)    All other components within normal limits  URINALYSIS, ROUTINE W REFLEX MICROSCOPIC - Abnormal; Notable for the following:    Glucose, UA >1000 (*)    Hgb urine dipstick TRACE (*)    Protein, ur TRACE (*)    All other components within normal limits  URINE MICROSCOPIC-ADD ON - Abnormal; Notable for the following:    Squamous Epithelial / LPF FEW (*)    All other components within normal limits  CBG MONITORING, ED - Abnormal; Notable for the following:    Glucose-Capillary 215 (*)    All other components within normal limits  TROPONIN I    Imaging Review Ct Head Wo Contrast  06/16/2014   CLINICAL DATA:  Altered mental status and confusion beginning 1 month ago. Agitation.  EXAM: CT HEAD WITHOUT CONTRAST  TECHNIQUE: Contiguous axial images were obtained from the base of the skull through the vertex without intravenous contrast.  COMPARISON:  01/20/2014  FINDINGS: The brain shows generalized atrophy. There is no evidence of old or acute focal infarction, mass lesion, hemorrhage, hydrocephalus or extra-axial collection. No calvarial abnormality. Sinuses, middle ears and mastoids are clear.  IMPRESSION: Atrophy.  No focal or acute  finding.   Electronically Signed   By: Paulina FusiMark  Shogry M.D.   On: 06/16/2014 15:44   Dg Chest Port 1 View  06/16/2014   CLINICAL DATA:  Confusion and generalized weakness for 3 days.  EXAM: PORTABLE CHEST - 1 VIEW  COMPARISON:  10/27/2007.  FINDINGS: The heart is enlarged but stable. There is tortuosity and ectasia of the thoracic aorta. Mild vascular could but no overt pulmonary edema. No focal infiltrates or effusion. The bony thorax is intact.  IMPRESSION: Mild vascular congestion but no edema, infiltrates or effusions.   Electronically Signed   By: Rudie MeyerP.  Gallerani M.D.   On: 06/16/2014 16:12     EKG Interpretation   Date/Time:  Wednesday June 16 2014 14:19:16 EST Ventricular Rate:  65 PR Interval:  167 QRS Duration: 106 QT Interval:  414 QTC Calculation: 430 R Axis:   68 Text Interpretation:  Sinus rhythm RSR' in V1 or V2, right VCD or RVH ECG  OTHERWISE WITHIN NORMAL LIMITS since last tracing no significant change  Confirmed by Hyacinth MeekerMILLER  MD, Aaleigha Bozza ([id number]) on 06/16/2014 3:28:45 PM      MDM   Final diagnoses:  Altered mental status  Hyperglycemia    The pt has mild change in mental status - he has normal VS other than mild htn, has normal CT head and labs that are overall unremarkable - he likely needs to be back on insulin, he has no signs of DKA - will d/w family - check CXR due to night sweats.  Cathlean Sauer.X-ray reviewed, CT scan reviewed, labwork reviewed. Other than some hyperglycemia there is no signs of DKA, no signs of pneumonia, lung mass, stroke, aneurysm or other significant problems. The patient has been stable throughout, vital signs are unremarkable, discussed all the findings with the family members who are in agreement that the patient can be discharged to follow-up with the family doctor tomorrow.    I personally performed the services described in this documentation, which was scribed in my presence. The recorded information has been reviewed and is accurate    Eber HongBrian Antoine Fiallos,  MD 06/16/14 [phone]

## 2014-06-16 NOTE — Discharge Instructions (Signed)
Please call your doctor for a followup appointment within 24-48 hours. When you talk to your doctor please let them know that you were seen in the emergency department and have them acquire all of your records so that they can discuss the findings with you and formulate a treatment plan to fully care for your new and ongoing problems. ° °

## 2014-06-18 ENCOUNTER — Ambulatory Visit: Payer: Medicare Other | Admitting: Urology

## 2014-08-19 ENCOUNTER — Encounter: Payer: Self-pay | Admitting: Cardiovascular Disease

## 2014-08-19 ENCOUNTER — Ambulatory Visit (INDEPENDENT_AMBULATORY_CARE_PROVIDER_SITE_OTHER): Payer: Medicare Other | Admitting: Cardiovascular Disease

## 2014-08-19 VITALS — BP 170/68 | HR 65 | Ht 62.0 in | Wt 148.9 lb

## 2014-08-19 DIAGNOSIS — E119 Type 2 diabetes mellitus without complications: Secondary | ICD-10-CM

## 2014-08-19 DIAGNOSIS — E785 Hyperlipidemia, unspecified: Secondary | ICD-10-CM

## 2014-08-19 DIAGNOSIS — I350 Nonrheumatic aortic (valve) stenosis: Secondary | ICD-10-CM

## 2014-08-19 DIAGNOSIS — I10 Essential (primary) hypertension: Secondary | ICD-10-CM | POA: Diagnosis not present

## 2014-08-19 NOTE — Patient Instructions (Signed)
Your physician recommends that you return for lab work in: 2-3 weeks. This will not need to be fasting.  Your physician has requested that you have an echocardiogram. Echocardiography is a painless test that uses sound waves to create images of your heart. It provides your doctor with information about the size and shape of your heart and how well your heart's chambers and valves are working. This procedure takes approximately one hour. There are no restrictions for this procedure. This will be done in 3 months.   Your physician has recommended you make the following change in your medication: increase the carvedilol to twice a day. Start new prescription for furosemide. This has already been sent to your pharmacy.  Your physician recommends that you schedule a follow-up appointment in: 3 months.

## 2014-08-20 ENCOUNTER — Encounter: Payer: Self-pay | Admitting: Cardiovascular Disease

## 2014-08-20 NOTE — Progress Notes (Signed)
Patient ID: HONEST VANLEER, male   DOB: [birth year], 81 y.o.   MRN: [id number]      HPI: OCTAVIS SHEELER is a 81 y.o. male who presents to the office today for 9 month cardiology evaluation.  Mr. Karner has a history of moderate aortic valve stenosis, hypertension, type 2 diabetes mellitus, hyperlipidemia, and has remote history of bradycardia. He also is status post cataract and retinal surgery. An echo Doppler study in January 2014 showed normal systolic function with EF of 60-65% with grade 1 diastolic dysfunction. He had a mean transvalvular aortic gradient of 30 mm with a peak gradient of 47 mm, not significantly changed from one year ago and his gradient was 24 and 42 mm, respectively. He does have mitral annular calcification.   On 11/26/2013, Mr. Romney underwent a one-year followup echo Doppler study.  This showed an ejection fraction of 60-65%.  There was grade 1 diastolic dysfunction.  He can was noted to have mild-to-moderate aortic valve stenosis with moderate bili, calcified annulus and severely thickened leaflets.  The valve area is 1.19 cm with a mean gradient of 36 and peak gradient of 61 mm.  Since I last saw him, he denies any episodes of chest pain or significant shortness of breath.  He does admit to swelling of his ankles.  He is Dr. Riley Kill, for primary care.  I reviewed laboratory from March 2016.  At that time his glucose was elevated at 244.  His renal function had improved from a creatinine of 1.76 in October 2015 to 1.32 on 06/16/2014.  His hemoglobin/hematocrit has also improved to 12.5/37.6.  He presents for follow-up evaluation.  Past Medical History  Diagnosis Date  . Hypertension   . Hyperlipidemia   . Bradycardia   . Diabetes mellitus without complication     type II  . Aortic stenosis, moderate 04/14/12    ECHO  EF 60-65%  ECHO-04/16/11 EF>55% Normal LV size and systolic function. Mildly dilated let atrium. Dystolic dysfunction, probably mild.    Past Surgical  History  Procedure Laterality Date  . Cateract    . Retna      No Known Allergies  Current Outpatient Prescriptions  Medication Sig Dispense Refill  . amLODipine (NORVASC) 10 MG tablet Take 10 mg by mouth daily.    Marland Kitchen aspirin 81 MG tablet Take 81 mg by mouth daily.    . carvedilol (COREG) 12.5 MG tablet Take 0.5 tablets (6.25 mg total) by mouth 2 (two) times daily with a meal. 1/2 tablet 90 tablet 3  . fenofibrate micronized (LOFIBRA) 134 MG capsule TAKE 1 BY MOUTH DAILY 90 capsule 2  . glipiZIDE (GLUCOTROL) 5 MG tablet Take 20 mg by mouth daily before breakfast.    . HYDROcodone-acetaminophen (NORCO) 7.5-325 MG per tablet Take 1 tablet by mouth 2 (two) times daily as needed for moderate pain.    Marland Kitchen insulin aspart (NOVOLOG) 100 UNIT/ML injection Inject into the skin 3 (three) times daily before meals.    . insulin NPH-regular (NOVOLIN 70/30) (70-30) 100 UNIT/ML injection Inject into the skin 2 (two) times daily with a meal. 25 units in the AM and 5 units in the PM.    . omeprazole (PRILOSEC) 20 MG capsule Take 20 mg by mouth daily.    . simvastatin (ZOCOR) 20 MG tablet Take 20 mg by mouth daily.    . tamsulosin (FLOMAX) 0.4 MG CAPS Take by mouth daily.     No current facility-administered medications for this visit.  Socially he is widowed has 3 children. There is a remote tobacco history having quit greater than 20 years ago.  ROS General: Negative; No fevers, chills, or night sweats;  HEENT: Negative; No changes in vision or hearing, sinus congestion, difficulty swallowing Pulmonary: Negative; No cough, wheezing, shortness of breath, hemoptysis Cardiovascular: See history of present illness; No chest pain, presyncope, syncope, palpitations Positive for leg swelling GI: Positive for GERD; No nausea, vomiting, diarrhea, or abdominal pain GU: Negative; No dysuria, hematuria, or difficulty voiding Musculoskeletal: Negative; no myalgias, joint pain, or weakness Hematologic/Oncology:  Negative; no easy bruising, bleeding Endocrine: Positive for type 2 diabetes mellitus Neuro: Negative; no changes in balance, headaches Skin: Negative; No rashes or skin lesions Psychiatric: Negative; No behavioral problems, depression Sleep: Negative; No snoring, daytime sleepiness, hypersomnolence, bruxism, restless legs, hypnogognic hallucinations, no cataplexy Other comprehensive 14 point system review is negative.   PE BP 170/68 mmHg  Pulse 65  Ht _0  (1.575 m)  Wt 148 lb 14.4 oz (67.541 kg)  BMI 27.23 kg/m2  Repeat blood pressure meds when checked by me was 170/70  Wt Readings from Last 3 Encounters:  08/19/14 148 lb 14.4 oz (67.541 kg)  06/16/14 160 lb (72.576 kg)  01/19/14 155 lb (70.308 kg)   General: Alert, oriented, no distress.  Skin: normal turgor, no rashes HEENT: Normocephalic, atraumatic. Pupils round and reactive; sclera anicteric;no lid lag.  Nose without nasal septal hypertrophy; small aperture to the left nares compared to the right. Mouth/Parynx benign; Mallinpatti scale 2 Neck: No JVD, he has transmitted murmurs to his carotids bilaterally. Lungs: clear to ausculatation and percussion; no wheezing or rales Chest wall: No tenderness to palpation Heart: RRR, s1 s2 normal; 9-4/1 systolic murmur in the aortic area and a whiff of aortic insufficiency.  No S3 gallop.  No diastolic rub thrills or heaves  Abdomen: soft, nontender; no hepatosplenomehaly, BS+; abdominal aorta nontender and not dilated by palpation. Back: No CVA tenderness Pulses 2+ Extremities: Moderate right lower extremity varicosities. Mild left lower extremity varicosity; 1+ edema of the ankles; no clubbing cyanosis, Homan's sign negative  Neurologic: grossly nonfocal; cranial nerves grossly normal. Psychological: Affect flat.  ECG (independently read by me): Normal sinus rhythm at 68 bpm.  QTc interval 424 ms.  No significant ST segment changes.  September 2015 ECG: Sinus bradycardia 55  beats per minute.  No ectopy.  Normal intervals.  04/21/2013 ECG (independently read by me): Normal sinus rhythm at 60 beats per minute;incomplete right bundle branch block. Normal intervals.  ECG from July 2014: Sinus rhythm at 53 beats per minute. QTc interval 405 msec.  LABS: BMP Latest Ref Rng 06/16/2014 01/19/2014 01/11/2014  Glucose 70 - 99 mg/dL 244(H) 171(H) 246(H)  BUN 6 - 23 mg/dL 34(H) 37(H) 35(H)  Creatinine 0.50 - 1.35 mg/dL 1.32 1.45(H) 1.76(H)  Sodium 135 - 145 mmol/L 135 136(L) 131(L)  Potassium 3.5 - 5.1 mmol/L 4.1 4.4 4.9  Chloride 96 - 112 mmol/L 103 101 102  CO2 19 - 32 mmol/L _1 Calcium 8.4 - 10.5 mg/dL 9.2 9.1 9.3   Hepatic Function Latest Ref Rng 06/16/2014 01/19/2014 04/12/2009  Total Protein 6.0 - 8.3 g/dL 7.2 7.1 6.8  Albumin 3.5 - 5.2 g/dL 4.3 3.9 3.6  AST 0 - 37 U/L _2 ALT 0 - 53 U/L _3 Alk Phosphatase 39 - 117 U/L 29(L) 25(L) 22(L)  Total Bilirubin 0.3 - 1.2 mg/dL 0.7 0.3 0.6   CBC Latest  Ref Rng 06/16/2014 01/19/2014 06/08/2009  WBC 4.0 - 10.5 K/uL 8.2 6.1 -  Hemoglobin 13.0 - 17.0 g/dL 12.5(L) 11.6(L) 13.3  Hematocrit 39.0 - 52.0 % 37.6(L) 33.9(L) 39.0  Platelets 150 - 400 K/uL 386 330 -   Lab Results  Component Value Date   MCV 89.5 06/16/2014   MCV 89.0 01/19/2014   MCV 88.8 04/12/2009   No results found for: TSH  Lipid Panel     Component Value Date/Time   CHOL  04/12/2009 0218    121        ATP III CLASSIFICATION:  <200     mg/dL   Desirable  200-239  mg/dL   Borderline High  >=240    mg/dL   High          TRIG 118 04/12/2009 0218   HDL 28* 04/12/2009 0218   CHOLHDL 4.3 04/12/2009 0218   VLDL 24 04/12/2009 0218   LDLCALC  04/12/2009 0218    69        Total Cholesterol/HDL:CHD Risk Coronary Heart Disease Risk Table                     Men   Women  1/2 Average Risk   3.4   3.3  Average Risk       5.0   4.4  2 X Average Risk   9.6   7.1  3 X Average Risk  23.4   11.0        Use the calculated Patient Ratio above  and the CHD Risk Table to determine the patient's CHD Risk.        ATP III CLASSIFICATION (LDL):  <100     mg/dL   Optimal  100-129  mg/dL   Near or Above                    Optimal  130-159  mg/dL   Borderline  160-189  mg/dL   High  >190     mg/dL   Very High     RADIOLOGY: No results found.    ASSESSMENT AND PLAN: Mr. Molner is an 81 year old white male who has a history of hypertension, hyperlipidemia, type 2 diabetes mellitus, as well as documented moderate aortic valve stenosis. An echo Doppler study January 2014 revealed a mean transvalvular aortic gradient of 30 mm with a peak gradient of 47 mm and a calculated aortic valve area 1.1 cm.  His echo from August 2015 revealed mild to moderate aortic stenosis with a mean gradient  36 and peak gradient of 61 mm.  LV function remains normal.  There is grade 1 diastolic dysfunction.  He continues to be relatively symptom-free with reference to his aortic stenosis, and specifically denies chest pain, presyncope or syncope, or CHF symptomatology.  His blood pressure today was elevated at 170/70.  He does have mild ankle edema.  He has been on amlodipine 10 mg, and is supposed to be on carvedilol 6.25 twice a day, but he is here with his daughter today and apparently she had only been inadvertently giving him this once a day.  His renal function has improved.  I'm starting him on Lasix 20 mg daily, which should be helpful both for his continued peripheral edema, as well as his blood pressure control.  His cardiac murmur is harsh and 2-3/6. In several months, he will undergo a one-year follow-up echo Doppler assessment of his aortic valve stenosis and I will  see him back in the office for reevaluation.   Troy Sine, MD, [facility]  08/20/2014 9:07 AM

## 2014-08-23 ENCOUNTER — Telehealth: Payer: Self-pay | Admitting: Cardiovascular Disease

## 2014-08-23 MED ORDER — FUROSEMIDE 20 MG PO TABS: 20.0000 mg | ORAL_TABLET | Freq: Every day | ORAL | Status: DC

## 2014-08-23 NOTE — Telephone Encounter (Signed)
Patient was seen by Dr. Tresa EndoKelly last week and was told he would call in Lasix.  That has not been done yet.

## 2014-08-23 NOTE — Telephone Encounter (Addendum)
Reviewed Dr. Landry DykeKelly's dictation from last OV - lasix 20mg  daily ordered per office note. Called pt & gave instruction on new med & submitted rx to preferred pharmacy.

## 2014-08-30 ENCOUNTER — Other Ambulatory Visit: Payer: Self-pay | Admitting: Cardiovascular Disease

## 2014-08-31 LAB — BASIC METABOLIC PANEL
BUN / CREAT RATIO: 25 — AB (ref 10–22)
BUN: 27 mg/dL (ref 8–27)
CALCIUM: 9.1 mg/dL (ref 8.6–10.2)
CO2: 24 mmol/L (ref 18–29)
CREATININE: 1.08 mg/dL (ref 0.76–1.27)
Chloride: 98 mmol/L (ref 97–108)
GFR calc Af Amer: 74 mL/min/{1.73_m2} (ref >59)
GFR, EST NON AFRICAN AMERICAN: 64 mL/min/{1.73_m2} (ref >59)
Glucose: 241 mg/dL — ABNORMAL HIGH (ref 65–99)
Potassium: 4.7 mmol/L (ref 3.5–5.2)
SODIUM: 137 mmol/L (ref 134–144)

## 2014-09-03 ENCOUNTER — Encounter: Payer: Self-pay | Admitting: *Deleted

## 2014-09-17 ENCOUNTER — Emergency Department (HOSPITAL_COMMUNITY): Payer: Medicare Other

## 2014-09-17 ENCOUNTER — Observation Stay (HOSPITAL_COMMUNITY)
Admission: EM | Admit: 2014-09-17 | Discharge: 2014-09-18 | Disposition: A | Discharge status: Home / Self Care | Payer: Medicare Other | Attending: Internal Medicine | Admitting: Internal Medicine

## 2014-09-17 ENCOUNTER — Encounter (HOSPITAL_COMMUNITY): Payer: Self-pay | Admitting: *Deleted

## 2014-09-17 DIAGNOSIS — Z7982 Long term (current) use of aspirin: Secondary | ICD-10-CM | POA: Diagnosis not present

## 2014-09-17 DIAGNOSIS — E785 Hyperlipidemia, unspecified: Secondary | ICD-10-CM | POA: Diagnosis not present

## 2014-09-17 DIAGNOSIS — G9340 Encephalopathy, unspecified: Secondary | ICD-10-CM

## 2014-09-17 DIAGNOSIS — Z79899 Other long term (current) drug therapy: Secondary | ICD-10-CM | POA: Insufficient documentation

## 2014-09-17 DIAGNOSIS — Z794 Long term (current) use of insulin: Secondary | ICD-10-CM | POA: Insufficient documentation

## 2014-09-17 DIAGNOSIS — E119 Type 2 diabetes mellitus without complications: Secondary | ICD-10-CM | POA: Insufficient documentation

## 2014-09-17 DIAGNOSIS — I350 Nonrheumatic aortic (valve) stenosis: Secondary | ICD-10-CM | POA: Insufficient documentation

## 2014-09-17 DIAGNOSIS — R4182 Altered mental status, unspecified: Principal | ICD-10-CM | POA: Insufficient documentation

## 2014-09-17 DIAGNOSIS — I10 Essential (primary) hypertension: Secondary | ICD-10-CM | POA: Insufficient documentation

## 2014-09-17 DIAGNOSIS — K219 Gastro-esophageal reflux disease without esophagitis: Secondary | ICD-10-CM | POA: Diagnosis present

## 2014-09-17 DIAGNOSIS — Z87891 Personal history of nicotine dependence: Secondary | ICD-10-CM | POA: Diagnosis not present

## 2014-09-17 HISTORY — DX: Other ill-defined heart diseases: I51.89

## 2014-09-17 LAB — COMPREHENSIVE METABOLIC PANEL
ALT: 14 U/L — ABNORMAL LOW (ref 17–63)
AST: 15 U/L (ref 15–41)
Albumin: 3.8 g/dL (ref 3.5–5.0)
Alkaline Phosphatase: 31 U/L — ABNORMAL LOW (ref 38–126)
Anion gap: 8 (ref 5–15)
BILIRUBIN TOTAL: 0.6 mg/dL (ref 0.3–1.2)
BUN: 27 mg/dL — ABNORMAL HIGH (ref 6–20)
CO2: 27 mmol/L (ref 22–32)
CREATININE: 1.05 mg/dL (ref 0.61–1.24)
Calcium: 8.7 mg/dL — ABNORMAL LOW (ref 8.9–10.3)
Chloride: 104 mmol/L (ref 101–111)
GFR calc non Af Amer: >60 mL/min (ref >60)
Glucose, Bld: 203 mg/dL — ABNORMAL HIGH (ref 65–99)
POTASSIUM: 3.7 mmol/L (ref 3.5–5.1)
Sodium: 139 mmol/L (ref 135–145)
Total Protein: 6.9 g/dL (ref 6.5–8.1)

## 2014-09-17 LAB — PROTIME-INR
INR: 1.24 (ref 0.00–1.49)
Prothrombin Time: 15.7 seconds — ABNORMAL HIGH (ref 11.6–15.2)

## 2014-09-17 LAB — VITAMIN B12: VITAMIN B 12: 588 pg/mL (ref 180–914)

## 2014-09-17 LAB — URINALYSIS, ROUTINE W REFLEX MICROSCOPIC
Bilirubin Urine: NEGATIVE
Glucose, UA: >1000 mg/dL — AB
HGB URINE DIPSTICK: NEGATIVE
Ketones, ur: NEGATIVE mg/dL
LEUKOCYTES UA: NEGATIVE
NITRITE: NEGATIVE
PROTEIN: NEGATIVE mg/dL
Specific Gravity, Urine: 1.02 (ref 1.005–1.030)
UROBILINOGEN UA: 0.2 mg/dL (ref 0.0–1.0)
pH: 5.5 (ref 5.0–8.0)

## 2014-09-17 LAB — GLUCOSE, CAPILLARY
GLUCOSE-CAPILLARY: 133 mg/dL — AB (ref 65–99)
GLUCOSE-CAPILLARY: 184 mg/dL — AB (ref 65–99)

## 2014-09-17 LAB — TSH: TSH: 1.07 u[IU]/mL (ref 0.350–4.500)

## 2014-09-17 LAB — CBC WITH DIFFERENTIAL/PLATELET
Basophils Absolute: 0.1 10*3/uL (ref 0.0–0.1)
Basophils Relative: 1 % (ref 0–1)
Eosinophils Absolute: 0.2 10*3/uL (ref 0.0–0.7)
Eosinophils Relative: 3 % (ref 0–5)
HCT: 37.3 % — ABNORMAL LOW (ref 39.0–52.0)
Hemoglobin: 12.4 g/dL — ABNORMAL LOW (ref 13.0–17.0)
Lymphocytes Relative: 26 % (ref 12–46)
Lymphs Abs: 1.8 10*3/uL (ref 0.7–4.0)
MCH: 29.4 pg (ref 26.0–34.0)
MCHC: 33.2 g/dL (ref 30.0–36.0)
MCV: 88.4 fL (ref 78.0–100.0)
Monocytes Absolute: 0.5 10*3/uL (ref 0.1–1.0)
Monocytes Relative: 7 % (ref 3–12)
NEUTROS ABS: 4.4 10*3/uL (ref 1.7–7.7)
Neutrophils Relative %: 63 % (ref 43–77)
Platelets: 293 10*3/uL (ref 150–400)
RBC: 4.22 MIL/uL (ref 4.22–5.81)
RDW: 13.5 % (ref 11.5–15.5)
WBC: 7 10*3/uL (ref 4.0–10.5)

## 2014-09-17 LAB — URINE MICROSCOPIC-ADD ON

## 2014-09-17 LAB — CBG MONITORING, ED: GLUCOSE-CAPILLARY: 205 mg/dL — AB (ref 65–99)

## 2014-09-17 LAB — TROPONIN I: Troponin I: <0.03 ng/mL (ref <0.031)

## 2014-09-17 MED ORDER — ONDANSETRON HCL 4 MG/2ML IJ SOLN: 4.0000 mg | Freq: Four times a day (QID) | INTRAMUSCULAR | Status: DC | PRN

## 2014-09-17 MED ORDER — HYDROCODONE-ACETAMINOPHEN 5-325 MG PO TABS: 1.0000 | ORAL_TABLET | ORAL | Status: DC | PRN

## 2014-09-17 MED ORDER — INSULIN ASPART 100 UNIT/ML ~~LOC~~ SOLN
0.0000 [IU] | Freq: Three times a day (TID) | SUBCUTANEOUS | Status: DC
  Administered 2014-09-17: 2 [IU] via SUBCUTANEOUS
  Administered 2014-09-18: 5 [IU] via SUBCUTANEOUS
  Administered 2014-09-18: 3 [IU] via SUBCUTANEOUS

## 2014-09-17 MED ORDER — SODIUM CHLORIDE 0.9 % IV SOLN
INTRAVENOUS | Status: AC
  Administered 2014-09-17: 18:00:00 via INTRAVENOUS

## 2014-09-17 MED ORDER — MAGNESIUM CITRATE PO SOLN: 1.0000 | Freq: Once | ORAL | Status: AC | PRN

## 2014-09-17 MED ORDER — SIMVASTATIN 20 MG PO TABS
20.0000 mg | ORAL_TABLET | Freq: Every day | ORAL | Status: DC
  Administered 2014-09-17 – 2014-09-18 (×2): 20 mg via ORAL
  Filled 2014-09-17 (×2): qty 1

## 2014-09-17 MED ORDER — BISACODYL 10 MG RE SUPP: 10.0000 mg | Freq: Every day | RECTAL | Status: DC | PRN

## 2014-09-17 MED ORDER — AMLODIPINE BESYLATE 5 MG PO TABS
10.0000 mg | ORAL_TABLET | Freq: Every day | ORAL | Status: DC
  Administered 2014-09-17 – 2014-09-18 (×2): 10 mg via ORAL
  Filled 2014-09-17 (×2): qty 2

## 2014-09-17 MED ORDER — ALUM & MAG HYDROXIDE-SIMETH 200-200-20 MG/5ML PO SUSP: 30.0000 mL | Freq: Four times a day (QID) | ORAL | Status: DC | PRN

## 2014-09-17 MED ORDER — ONDANSETRON HCL 4 MG PO TABS: 4.0000 mg | ORAL_TABLET | Freq: Four times a day (QID) | ORAL | Status: DC | PRN

## 2014-09-17 MED ORDER — ACETAMINOPHEN 325 MG PO TABS: 650.0000 mg | ORAL_TABLET | Freq: Four times a day (QID) | ORAL | Status: DC | PRN

## 2014-09-17 MED ORDER — SODIUM CHLORIDE 0.9 % IV BOLUS (SEPSIS)
500.0000 mL | Freq: Once | INTRAVENOUS | Status: AC
  Administered 2014-09-17: 500 mL via INTRAVENOUS

## 2014-09-17 MED ORDER — ACETAMINOPHEN 650 MG RE SUPP: 650.0000 mg | Freq: Four times a day (QID) | RECTAL | Status: DC | PRN

## 2014-09-17 MED ORDER — TRAZODONE HCL 50 MG PO TABS: 25.0000 mg | ORAL_TABLET | Freq: Every evening | ORAL | Status: DC | PRN

## 2014-09-17 MED ORDER — ENOXAPARIN SODIUM 40 MG/0.4ML ~~LOC~~ SOLN
40.0000 mg | SUBCUTANEOUS | Status: DC
  Administered 2014-09-17: 40 mg via SUBCUTANEOUS
  Filled 2014-09-17: qty 0.4

## 2014-09-17 MED ORDER — INSULIN ASPART 100 UNIT/ML ~~LOC~~ SOLN: 0.0000 [IU] | Freq: Every day | SUBCUTANEOUS | Status: DC

## 2014-09-17 MED ORDER — ASPIRIN EC 81 MG PO TBEC
81.0000 mg | DELAYED_RELEASE_TABLET | Freq: Every day | ORAL | Status: DC
  Administered 2014-09-17 – 2014-09-18 (×2): 81 mg via ORAL
  Filled 2014-09-17 (×2): qty 1

## 2014-09-17 MED ORDER — CARVEDILOL 3.125 MG PO TABS
6.2500 mg | ORAL_TABLET | Freq: Two times a day (BID) | ORAL | Status: DC
  Administered 2014-09-17 – 2014-09-18 (×2): 6.25 mg via ORAL
  Filled 2014-09-17 (×2): qty 2

## 2014-09-17 MED ORDER — SODIUM CHLORIDE 0.9 % IJ SOLN
3.0000 mL | Freq: Two times a day (BID) | INTRAMUSCULAR | Status: DC
  Administered 2014-09-18: 3 mL via INTRAVENOUS

## 2014-09-17 MED ORDER — PANTOPRAZOLE SODIUM 40 MG PO TBEC
40.0000 mg | DELAYED_RELEASE_TABLET | Freq: Every day | ORAL | Status: DC
  Administered 2014-09-17 – 2014-09-18 (×2): 40 mg via ORAL
  Filled 2014-09-17 (×2): qty 1

## 2014-09-17 MED ORDER — TAMSULOSIN HCL 0.4 MG PO CAPS
0.4000 mg | ORAL_CAPSULE | Freq: Every day | ORAL | Status: DC
  Administered 2014-09-17: 0.4 mg via ORAL
  Filled 2014-09-17: qty 1

## 2014-09-17 NOTE — ED Notes (Signed)
Daughter states patient was having "dizzy"

## 2014-09-17 NOTE — H&P (Signed)
[facility] History and Physical  Juan Pham GXQ:[id number] DOB: October 07, 1930 DOA: 09/17/2014  Referring physician: cook PCP: Cassell Smiles., MD   Chief Complaint: altered mental status  HPI: Juan Pham is a very pleasant 81 y.o. male with a past medical history that includes hypertension, diabetes, hyperlipidemia, presents to emergency department from home with the chief complaint of altered mental status. Information is obtained from the daughter who lives next door. She states that 2 days ago her father was in the yard gardening and tripped over a rake and fell. There is no report of injury loss of consciousness. Patient denies feeling dizzy or fainting. Yesterday patient attended granddaughter's graduation and complained of dizziness upon standing throughout the day. Daughter reports checking his blood sugar and it was over 400 so "I gave him a shot". This morning when she went to check on patient she noticed he was in the same clothing that he had on yesterday and was sitting up in the chair. She also reports patient ambulated to the kitchen and voided on the floor thinking it was the bathroom. Patient has no recollection of these events. Daughter reports patient does live alone and his appetite is unreliable and is not completely known if he eats and drinks an appropriate amount or manages his diabetes appropriately. Recent denies pain discomfort shortness of breath headache visual disturbances. He denies any recent illness fever chills abdominal pain nausea vomiting diarrhea or constipation. He denies dysuria hematuria frequency or urgency. In the emergency room complete metabolic panel significant for glucose of 203 otherwise unremarkable, complete blood count unremarkable, initial troponin negative, urinalysis unremarkable, CT of the head with no acute findings, this x-ray with by basilar atelectasis, EKG with sinus rhythm and mild ST depression.  In the emergency department he is  hemodynamically stable afebrile not hypoxic. He is provided with 500 mils of normal saline intravenously. Review of Systems:  10 point review of systems complete and all systems are negative except as indicated in the history of present illness  Past Medical History  Diagnosis Date  . Hypertension   . Hyperlipidemia   . Bradycardia   . Diabetes mellitus without complication     type II  . Aortic stenosis, moderate 04/14/12    ECHO  EF 60-65%  ECHO-04/16/11 EF>55% Normal LV size and systolic function. Mildly dilated let atrium. Dystolic dysfunction, probably mild.   Past Surgical History  Procedure Laterality Date  . Cateract    . Retna     Social History:  reports that he quit smoking about 31 years ago. His smoking use included Pipe. He has never used smokeless tobacco. He reports that he does not drink alcohol or use illicit drugs. He lives alone he is independent with ADLs he has demonstrated some short-term memory issues ambulates without assistance No Known Allergies  Family History  Problem Relation Age of Onset  . Heart disease Mother      Prior to Admission medications   Medication Sig Start Date End Date Taking? Authorizing Provider  amLODipine (NORVASC) 10 MG tablet Take 10 mg by mouth daily.   Yes Historical Provider, MD  aspirin 81 MG tablet Take 81 mg by mouth daily.   Yes Historical Provider, MD  carvedilol (COREG) 12.5 MG tablet Take 0.5 tablets (6.25 mg total) by mouth 2 (two) times daily with a meal. 1/2 tablet 02/01/14  Yes Lennette Bihari, MD  Cholecalciferol 1000 UNIT/10ML LIQD Take 1 capsule by mouth daily.   Yes Historical Provider,  MD  fenofibrate micronized (LOFIBRA) 134 MG capsule TAKE 1 BY MOUTH DAILY 03/22/14  Yes Lennette Bihari, MD  furosemide (LASIX) 20 MG tablet Take 1 tablet (20 mg total) by mouth daily. 08/23/14  Yes Lennette Bihari, MD  gabapentin (NEURONTIN) 100 MG capsule Take 100 mg by mouth at bedtime.  09/01/14  Yes Historical Provider, MD    glimepiride (AMARYL) 2 MG tablet Take 2 mg by mouth daily with breakfast.  09/01/14  Yes Historical Provider, MD  glipiZIDE (GLUCOTROL) 5 MG tablet Take 20 mg by mouth daily before breakfast.   Yes Historical Provider, MD  HYDROcodone-acetaminophen (NORCO) 7.5-325 MG per tablet Take 1 tablet by mouth 2 (two) times daily as needed for moderate pain.   Yes Historical Provider, MD  insulin NPH-regular (NOVOLIN 70/30) (70-30) 100 UNIT/ML injection Inject into the skin 2 (two) times daily with a meal. 25 units in the AM and 5 units in the PM.   Yes Historical Provider, MD  omeprazole (PRILOSEC) 20 MG capsule Take 20 mg by mouth daily.   Yes Historical Provider, MD  simvastatin (ZOCOR) 20 MG tablet Take 20 mg by mouth daily.   Yes Historical Provider, MD  tamsulosin (FLOMAX) 0.4 MG CAPS Take by mouth daily.   Yes Historical Provider, MD   Physical Exam: Filed Vitals:   09/17/14 1145 09/17/14 1200 09/17/14 1215 09/17/14 1245  BP: 156/50  163/33 177/82  Pulse: 58 61  58  Temp:      TempSrc:      Resp: Weight:      SpO2: 99% 98%  99%    Wt Readings from Last 3 Encounters:  09/17/14 77.111 kg (170 lb)  08/19/14 67.541 kg (148 lb 14.4 oz)  06/16/14 72.576 kg (160 lb)    General:  Appears calm and comfortable slightly pale Eyes: PERRL, normal lids, irises & conjunctiva ENT: grossly normal hearing, lips & tongue, mucous membranes of his mouth are pink slightly dry Neck: no LAD, masses or thyromegaly Cardiovascular: RRR, +murmur, no gallop or rub No LE edema. Respiratory: CTA bilaterally, no w/r/r. Normal respiratory effort. Abdomen: soft, ntnd, no organomegaly no guarding or rebounding Skin: no rash or induration seen on limited exam Musculoskeletal: grossly normal tone BUE/BLE Psychiatric: grossly normal mood and affect, speech fluent and appropriate Neurologic: grossly non-focal. speech slow but clear bilateral grip 5 out of 5 lower extremity strength 5 out of 5 oriented to self  and time           Labs on Admission:  Basic Metabolic Panel:  Recent Labs Lab 09/17/14 0845  NA 139  K 3.7  CL 104  CO2 27  GLUCOSE 203*  BUN 27*  CREATININE 1.05  CALCIUM 8.7*   Liver Function Tests:  Recent Labs Lab 09/17/14 0845  AST 15  ALT 14*  ALKPHOS 31*  BILITOT 0.6  PROT 6.9  ALBUMIN 3.8   No results for input(s): LIPASE, AMYLASE in the last 168 hours. No results for input(s): AMMONIA in the last 168 hours. CBC:  Recent Labs Lab 09/17/14 0845  WBC 7.0  NEUTROABS 4.4  HGB 12.4*  HCT 37.3*  MCV 88.4  PLT 293   Cardiac Enzymes:  Recent Labs Lab 09/17/14 0845  TROPONINI <0.03    BNP (last 3 results) No results for input(s): BNP in the last 8760 hours.  ProBNP (last 3 results) No results for input(s): PROBNP in the last 8760 hours.  CBG:  Recent Labs Lab  09/17/14 0832  GLUCAP 205*    Radiological Exams on Admission: Dg Chest 2 View  09/17/2014   CLINICAL DATA:  Disorientation, dizziness, history hypertension, diabetes, former smoker  EXAM: CHEST  2 VIEW  COMPARISON:  06/16/2014  FINDINGS: Upper normal heart size.  Tortuous aorta.  Mediastinal contours and pulmonary vascularity normal.  Bibasilar atelectasis.  Lungs otherwise clear.  No pleural effusion or pneumothorax.  IMPRESSION: Bibasilar atelectasis.   Electronically Signed   By: Ulyses Southward M.D.   On: 09/17/2014 12:38   Ct Head Wo Contrast  09/17/2014   CLINICAL DATA:  Altered mental status. Confusion. Lethargy. Diabetes and hypertension.  EXAM: CT HEAD WITHOUT CONTRAST  TECHNIQUE: Contiguous axial images were obtained from the base of the skull through the vertex without intravenous contrast.  COMPARISON:  06/16/2014  FINDINGS: There is no evidence of intracranial hemorrhage, brain edema, or other signs of acute infarction. There is no evidence of intracranial mass lesion or mass effect. No abnormal extraaxial fluid collections are identified.  Moderate diffuse cerebral atrophy is  stable. Ventricles are stable in size. 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 No skull abnormality identified.  IMPRESSION: No acute intracranial findings.  Stable cerebral atrophy and old bilateral basal ganglia lacunar infarcts.   Electronically Signed   By: Myles Rosenthal M.D.   On: 09/17/2014 10:17    EKG: Independently reviewed sinus rhythm  Assessment/Plan Principal Problem:   Acute encephalopathy: Etiology uncertain. CT of the head negative I doubt he's had a neurological event but will get an MRI. I suspect that the combination of mild dehydration uncontrolled diabetes in the setting of mild dementia and narcotic. Will admit for observation. Will obtain a TSH vitamin B-12 and RPR. Will obtain a hemoglobin A1c use sliding scale insulin for optimal control. Home medication list Amaryl and 70/30 insulin however family describes a completely different regimen. Will request diabetic coordinator for consult/evaluation. Hold narcotics as well. Obtain orthostatics. Request PT eval Active Problems:    Essential hypertension: fair control. Home med include Norvasc, Coreg Lasix. Will hold Lasix and continue the other. Monitor closely    Type 2 diabetes mellitus: History of present illness concerning for poor control and poor management. Will obtain a hemoglobin A1c. We'll hold his Amaryl and his 7030 for now. Will use sliding scale insulin for optimal control. Will also ask diabetes coordinator consult with patient and family for evaluation of patient's ability to manage diabetes unassisted    Hyperlipidemia: Continue statin obtain lipid panel    GERD (gastroesophageal reflux disease): Stable at baseline  Code Status: full DVT Prophylaxis: Family Communication: daughter at bedside Disposition Plan: home likely in am  Time spent: 60 minutes  [facility] Pager [phone]

## 2014-09-17 NOTE — ED Notes (Signed)
hospitalist at bedside

## 2014-09-17 NOTE — ED Notes (Signed)
Notified CT patient ready for p/u.

## 2014-09-17 NOTE — ED Notes (Addendum)
Pt now going to 339. pt and pt family aware. Per Unit secretary, receiving RN not available.

## 2014-09-17 NOTE — ED Notes (Signed)
Daughter called EMS d/t patient's altered LOC.  EMS arrival patient was alert, oriented, but lethargic.  Daughter reported CBG was over 400 and she gave 20 units Humalog.  EMS CBG was 314.  Patient is awake upon arrival to ER.

## 2014-09-17 NOTE — ED Notes (Signed)
Notified CT that patient needs to go back for scan.  They will come now to get him.

## 2014-09-17 NOTE — ED Notes (Signed)
Daughter reports patient had "dizzy" spells yesterday when rising from seated position.
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
## 2014-09-17 NOTE — ED Provider Notes (Signed)
CSN: [id number]     Arrival date & time 09/17/14  0807 History  This chart was scribed for Donnetta Hutching, MD by Tanda Rockers, ED Scribe. This patient was seen in room APA09/APA09 and the patient's care was started at 8:24 AM.    Chief Complaint  Patient presents with  . Altered Mental Status   The history is provided by the patient and a relative. No language interpreter was used.     HPI Comments: Level V caveat for altered mental status KLEVER TWYFORD is a 81 y.o. male brought in by ambulance, with hx HTN, HLD, and DM who presents to the Emergency Department complaining of altered mental status that occurred earlier today. Daughter went to visit pt at home today and called EMS due to pt being altered. Daughter notes that pt was in the same clothes that he wore yesterday and he wasn't sitting in his usual chair. Pt was able to walk from the one chair to the other with assistance. Family mentions that pt had a big day yesterday due to going to niece's graduation. Pt's glucose level was also 405 at time of incident. Daughter administered 20 units Humalog at that time. Pt's CBG is 205 in the ED. Family notes that pt has also note eaten since 5:30 PM yesterday. Pt was seen at PCP's office 1 week ago. He was placed on a diuretic at that time due to increased fluid in his legs. Pt also saw a cardiologist at that time with no acute findings. Pt denies any pain, difficulty urinating, chest pain, shortness of breath, or any other symptoms. Daughter is unsure if pt has had a UTI in the past.   PCP - Sherwood Gambler   Past Medical History  Diagnosis Date  . Hypertension   . Hyperlipidemia   . Bradycardia   . Diabetes mellitus without complication     type II  . Aortic stenosis, moderate 04/14/12    ECHO  EF 60-65%  ECHO-04/16/11 EF>55% Normal LV size and systolic function. Mildly dilated let atrium. Dystolic dysfunction, probably mild.   Past Surgical History  Procedure Laterality Date  . Cateract    . Retna      Family History  Problem Relation Age of Onset  . Heart disease Mother    History  Substance Use Topics  . Smoking status: Former Smoker    Types: Pipe    Quit date: 10/21/1982  . Smokeless tobacco: Never Used  . Alcohol Use: No    Review of Systems  Unable to perform ROS   A complete 10 system review of systems was obtained and all systems are negative except as noted in the HPI and PMH.    Allergies  Review of patient's allergies indicates no known allergies.  Home Medications   Prior to Admission medications   Medication Sig Start Date End Date Taking? Authorizing Provider  amLODipine (NORVASC) 10 MG tablet Take 10 mg by mouth daily.   Yes Historical Provider, MD  aspirin 81 MG tablet Take 81 mg by mouth daily.   Yes Historical Provider, MD  carvedilol (COREG) 12.5 MG tablet Take 0.5 tablets (6.25 mg total) by mouth 2 (two) times daily with a meal. 1/2 tablet 02/01/14  Yes Lennette Bihari, MD  Cholecalciferol 1000 UNIT/10ML LIQD Take 1 capsule by mouth daily.   Yes Historical Provider, MD  fenofibrate micronized (LOFIBRA) 134 MG capsule TAKE 1 BY MOUTH DAILY 03/22/14  Yes Lennette Bihari, MD  furosemide (LASIX) 20 MG  tablet Take 1 tablet (20 mg total) by mouth daily. 08/23/14  Yes Lennette Bihari, MD  gabapentin (NEURONTIN) 100 MG capsule Take 100 mg by mouth at bedtime.  09/01/14  Yes Historical Provider, MD  glimepiride (AMARYL) 2 MG tablet Take 2 mg by mouth daily with breakfast.  09/01/14  Yes Historical Provider, MD  glipiZIDE (GLUCOTROL) 5 MG tablet Take 20 mg by mouth daily before breakfast.   Yes Historical Provider, MD  HYDROcodone-acetaminophen (NORCO) 7.5-325 MG per tablet Take 1 tablet by mouth 2 (two) times daily as needed for moderate pain.   Yes Historical Provider, MD  insulin NPH-regular (NOVOLIN 70/30) (70-30) 100 UNIT/ML injection Inject into the skin 2 (two) times daily with a meal. 25 units in the AM and 5 units in the PM.   Yes Historical Provider, MD   omeprazole (PRILOSEC) 20 MG capsule Take 20 mg by mouth daily.   Yes Historical Provider, MD  simvastatin (ZOCOR) 20 MG tablet Take 20 mg by mouth daily.   Yes Historical Provider, MD  tamsulosin (FLOMAX) 0.4 MG CAPS Take by mouth daily.   Yes Historical Provider, MD   Triage Vitals: BP 181/64 mmHg  Pulse 56  Temp(Src) 97.5 F (36.4 C) (Oral)  Resp 20  Wt 170 lb (77.111 kg)  SpO2 98%   Physical Exam  Constitutional: He appears well-developed and well-nourished.  Appeared slightly confused but his speech is normal.   HENT:  Head: Normocephalic and atraumatic.  Eyes: Conjunctivae and EOM are normal. Pupils are equal, round, and reactive to light.  Neck: Normal range of motion. Neck supple.  Cardiovascular: Normal rate and regular rhythm.   Pulmonary/Chest: Effort normal and breath sounds normal.  Abdominal: Soft. Bowel sounds are normal.  Musculoskeletal: Normal range of motion.  Neurological: He is alert.  Moving all extremities to command.  No facial asymmetry.    Skin: Skin is warm and dry.  Psychiatric: He has a normal mood and affect. His behavior is normal.  Nursing note and vitals reviewed.   ED Course  Procedures (including critical care time)  DIAGNOSTIC STUDIES: Oxygen Saturation is 98% on RA, normal by my interpretation.    COORDINATION OF CARE: 8:32 AM- Discussed treatment plan which includes UA and CT Head with pt at bedside and pt agreed to plan.   Labs Review Labs Reviewed  URINALYSIS, ROUTINE W REFLEX MICROSCOPIC (NOT AT [facility]) - Abnormal; Notable for the following:    APPearance HAZY (*)    Glucose, UA >1000 (*)    All other components within normal limits  CBC WITH DIFFERENTIAL/PLATELET - Abnormal; Notable for the following:    Hemoglobin 12.4 (*)    HCT 37.3 (*)    All other components within normal limits  COMPREHENSIVE METABOLIC PANEL - Abnormal; Notable for the following:    Glucose, Bld 203 (*)    BUN 27 (*)    Calcium 8.7 (*)    ALT 14 (*)     Alkaline Phosphatase 31 (*)    All other components within normal limits  PROTIME-INR - Abnormal; Notable for the following:    Prothrombin Time 15.7 (*)    All other components within normal limits  CBG MONITORING, ED - Abnormal; Notable for the following:    Glucose-Capillary 205 (*)    All other components within normal limits  TROPONIN I  URINE MICROSCOPIC-ADD ON    Imaging Review Ct Head Wo Contrast  09/17/2014   CLINICAL DATA:  Altered mental status. Confusion. Lethargy. Diabetes  and hypertension.  EXAM: CT HEAD WITHOUT CONTRAST  TECHNIQUE: Contiguous axial images were obtained from the base of the skull through the vertex without intravenous contrast.  COMPARISON:  06/16/2014  FINDINGS: There is no evidence of intracranial hemorrhage, brain edema, or other signs of acute infarction. There is no evidence of intracranial mass lesion or mass effect. No abnormal extraaxial fluid collections are identified.  Moderate diffuse cerebral atrophy is stable. Ventricles are stable in size. 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 No skull abnormality identified.  IMPRESSION: No acute intracranial findings.  Stable cerebral atrophy and old bilateral basal ganglia lacunar infarcts.   Electronically Signed   By: Myles Rosenthal M.D.   On: 09/17/2014 10:17     EKG Interpretation   Date/Time:  Friday September 17 2014 08:15:41 EDT Ventricular Rate:  57 PR Interval:  193 QRS Duration: 105 QT Interval:  438 QTC Calculation: 426 R Axis:   59 Text Interpretation:  Sinus rhythm Minimal ST depression, lateral leads  Confirmed by Adriana Simas  MD, Shalimar Mcclain ([id number]) on 09/17/2014 9:22:53 AM      MDM   Final diagnoses:  Altered mental status, unspecified altered mental status type   Uncertain etiology of AMS.  Could be CVA.  Urine clean.  CXR pending. Admit to Hospitalist    I personally performed the services described in this documentation, which was scribed in my presence. The recorded  information has been reviewed and is accurate.      Donnetta Hutching, MD 09/17/14 1239

## 2014-09-17 NOTE — ED Notes (Signed)
Pt remains in MRI 

## 2014-09-17 NOTE — ED Notes (Signed)
Patient only ate sausage from breakfast tray.  Daughter requesting PB crackers.  These were supplied.  Order called to dietary for diabetic lunch tray.

## 2014-09-17 NOTE — ED Notes (Signed)
Diabetes coordinator at bedside with pt and pt family.

## 2014-09-17 NOTE — ED Notes (Signed)
Attempted report and receiiving unit reported bed assignment was going to change would call back once room assigned.

## 2014-09-17 NOTE — ED Notes (Signed)
Patient was provided w/breakfast tray and diet Coke.

## 2014-09-18 DIAGNOSIS — E1165 Type 2 diabetes mellitus with hyperglycemia: Secondary | ICD-10-CM | POA: Diagnosis not present

## 2014-09-18 DIAGNOSIS — E785 Hyperlipidemia, unspecified: Secondary | ICD-10-CM | POA: Diagnosis not present

## 2014-09-18 DIAGNOSIS — I10 Essential (primary) hypertension: Secondary | ICD-10-CM

## 2014-09-18 DIAGNOSIS — G9340 Encephalopathy, unspecified: Secondary | ICD-10-CM

## 2014-09-18 LAB — BASIC METABOLIC PANEL
Anion gap: 7 (ref 5–15)
BUN: 19 mg/dL (ref 6–20)
CHLORIDE: 105 mmol/L (ref 101–111)
CO2: 24 mmol/L (ref 22–32)
Calcium: 8.3 mg/dL — ABNORMAL LOW (ref 8.9–10.3)
Creatinine, Ser: 0.9 mg/dL (ref 0.61–1.24)
GFR calc Af Amer: >60 mL/min (ref >60)
Glucose, Bld: 211 mg/dL — ABNORMAL HIGH (ref 65–99)
POTASSIUM: 4.1 mmol/L (ref 3.5–5.1)
Sodium: 136 mmol/L (ref 135–145)

## 2014-09-18 LAB — RPR: RPR: NONREACTIVE

## 2014-09-18 LAB — GLUCOSE, CAPILLARY
GLUCOSE-CAPILLARY: 202 mg/dL — AB (ref 65–99)
GLUCOSE-CAPILLARY: 163 mg/dL — AB (ref 65–99)

## 2014-09-18 LAB — HEMOGLOBIN A1C
Hgb A1c MFr Bld: 9.6 % — ABNORMAL HIGH (ref 4.8–5.6)
MEAN PLASMA GLUCOSE: 229 mg/dL

## 2014-09-18 LAB — HIV ANTIBODY (ROUTINE TESTING W REFLEX): HIV SCREEN 4TH GENERATION: NONREACTIVE

## 2014-09-18 NOTE — Progress Notes (Signed)
IV removed. Discharge instructions reviewed with patient and daughter. Taken out via wheelchair for discharge home.

## 2014-09-18 NOTE — Discharge Summary (Signed)
Physician Discharge Summary  Juan Pham:[id number] DOB: 09-05-1930 DOA: 09/17/2014  PCP: Cassell Smiles., MD  Admit date: 09/17/2014 Discharge date: 09/18/2014  Time spent: 45 minutes  Recommendations for Outpatient Follow-up:  1. Follow up with PCP in one week.   Discharge Diagnoses:  Principal Problem:   Acute encephalopathy Active Problems:   Essential hypertension   Type 2 diabetes mellitus   Hyperlipidemia   GERD (gastroesophageal reflux disease)   Discharge Condition: Same.  Diet recommendation: As tolerated.   Filed Weights   09/17/14 0810 09/17/14 1709  Weight: 77.111 kg (170 lb) 66.7 kg (147 lb 0.8 oz)    History of present illness: Patient was admitted by me and Juan Smothers, NP C for altered mental status.  As per her H and P"  Juan Pham is a very pleasant 81 y.o. male with a past medical history that includes hypertension, diabetes, hyperlipidemia, presents to emergency department from home with the chief complaint of altered mental status. Information is obtained from the daughter who lives next door. She states that 2 days ago her father was in the yard gardening and tripped over a rake and fell. There is no report of injury loss of consciousness. Patient denies feeling dizzy or fainting. Yesterday patient attended granddaughter's graduation and complained of dizziness upon standing throughout the day. Daughter reports checking his blood sugar and it was over 400 so "I gave him a shot". This morning when she went to check on patient she noticed he was in the same clothing that he had on yesterday and was sitting up in the chair. She also reports patient ambulated to the kitchen and voided on the floor thinking it was the bathroom. Patient has no recollection of these events. Daughter reports patient does live alone and his appetite is unreliable and is not completely known if he eats and drinks an appropriate amount or manages his diabetes appropriately. Recent  denies pain discomfort shortness of breath headache visual disturbances. He denies any recent illness fever chills abdominal pain nausea vomiting diarrhea or constipation. He denies dysuria hematuria frequency or urgency. In the emergency room complete metabolic panel significant for glucose of 203 otherwise unremarkable, complete blood count unremarkable, initial troponin negative, urinalysis unremarkable, CT of the head with no acute findings, this x-ray with by basilar atelectasis, EKG with sinus rhythm and mild ST depression.  In the emergency department he is hemodynamically stable afebrile not hypoxic. He is provided with 500 mils of normal saline intravenously.  Hospital Course:  It was felt that this 81 yo man living alone with close family support, presented with altered mental status, not as good memory as before, though no focal neurological deficit. He has chronic neck pain, and has been taking "rare" Narcotics. Also has DM, and yesterday, he was having elevated BS. On the day of admission, it is OK. Work up was essentially negative with head CT, UA, CXR, and serology was unremarkable.We suspect his memory loss was not acute, and it can be aggravated with narcotic use and dehydration. He was admitted for OBS,  Narcotics held, and give gentle IVF. An MRI of the head was done to see if he had an acute CVA, but it was negative.  Finally, he does have what appears to be a basal cell carcinoma on his nose. I recommended that he follow up with a dermatologist outpatient when he is discharged. On the day of discharge, he sat up and ate his breakfast.  He was alert and  orient, with some confusion.  It is likely he has some undiagnosed dementia, and his family was told of this as well.  He is stable for discharge, and will be discharged today.  He will follow up with his PCP in one week.  Thank you for allowing me to participate in his care.    Consultations:  NONE>   Discharge Exam: Filed  Vitals:   09/18/14 0832  BP: 169/49  Pulse: 63  Temp:   Resp:     General: alert and orient.  Cardiovascular: S1S2.  Respiratory: clear.   Discharge Instructions   Discharge Instructions    Diet - low sodium heart healthy    Complete by:  As directed      Increase activity slowly    Complete by:  As directed           Current Discharge Medication List    CONTINUE these medications which have NOT CHANGED   Details  amLODipine (NORVASC) 10 MG tablet Take 10 mg by mouth daily.    aspirin 81 MG tablet Take 81 mg by mouth daily.    carvedilol (COREG) 12.5 MG tablet Take 0.5 tablets (6.25 mg total) by mouth 2 (two) times daily with a meal. 1/2 tablet Qty: 90 tablet, Refills: 3    Cholecalciferol 1000 UNIT/10ML LIQD Take 1 capsule by mouth daily.    fenofibrate micronized (LOFIBRA) 134 MG capsule TAKE 1 BY MOUTH DAILY Qty: 90 capsule, Refills: 2    furosemide (LASIX) 20 MG tablet Take 1 tablet (20 mg total) by mouth daily. Qty: 90 tablet, Refills: 3    gabapentin (NEURONTIN) 100 MG capsule Take 100 mg by mouth at bedtime.     glimepiride (AMARYL) 2 MG tablet Take 2 mg by mouth daily with breakfast.     glipiZIDE (GLUCOTROL) 5 MG tablet Take 20 mg by mouth daily before breakfast.    insulin NPH-regular (NOVOLIN 70/30) (70-30) 100 UNIT/ML injection Inject into the skin 2 (two) times daily with a meal. 25 units in the AM and 5 units in the PM.    simvastatin (ZOCOR) 20 MG tablet Take 20 mg by mouth daily.    tamsulosin (FLOMAX) 0.4 MG CAPS Take by mouth daily.      STOP taking these medications     HYDROcodone-acetaminophen (NORCO) 7.5-325 MG per tablet      omeprazole (PRILOSEC) 20 MG capsule        No Known Allergies    The results of significant diagnostics from this hospitalization (including imaging, microbiology, ancillary and laboratory) are listed below for reference.    Significant Diagnostic Studies: Dg Chest 2 View  09/17/2014   CLINICAL DATA:   Disorientation, dizziness, history hypertension, diabetes, former smoker  EXAM: CHEST  2 VIEW  COMPARISON:  06/16/2014  FINDINGS: Upper normal heart size.  Tortuous aorta.  Mediastinal contours and pulmonary vascularity normal.  Bibasilar atelectasis.  Lungs otherwise clear.  No pleural effusion or pneumothorax.  IMPRESSION: Bibasilar atelectasis.   Electronically Signed   By: Ulyses Southward M.D.   On: 09/17/2014 12:38   Ct Head Wo Contrast  09/17/2014   CLINICAL DATA:  Altered mental status. Confusion. Lethargy. Diabetes and hypertension.  EXAM: CT HEAD WITHOUT CONTRAST  TECHNIQUE: Contiguous axial images were obtained from the base of the skull through the vertex without intravenous contrast.  COMPARISON:  06/16/2014  FINDINGS: There is no evidence of intracranial hemorrhage, brain edema, or other signs of acute infarction. There is no evidence of intracranial  mass lesion or mass effect. No abnormal extraaxial fluid collections are identified.  Moderate diffuse cerebral atrophy is stable. Ventricles are stable in size. 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 No skull abnormality identified.  IMPRESSION: No acute intracranial findings.  Stable cerebral atrophy and old bilateral basal ganglia lacunar infarcts.   Electronically Signed   By: Myles Rosenthal M.D.   On: 09/17/2014 10:17   Mr Brain Wo Contrast  09/17/2014   CLINICAL DATA:  81 year old male with altered mental status, confusion. Acute encephalopathy. Initial encounter.  EXAM: MRI HEAD WITHOUT CONTRAST  TECHNIQUE: Multiplanar, multiecho pulse sequences of the brain and surrounding structures were obtained without intravenous contrast.  COMPARISON:  Head CTs without contrast 0950 hours today and earlier.  FINDINGS: No restricted diffusion to suggest acute infarction. No midline shift, mass effect, evidence of mass lesion, ventriculomegaly, extra-axial collection or acute intracranial hemorrhage. Cervicomedullary junction and  pituitary are within normal limits. Negative for age visualized cervical spine. Major intracranial vascular flow voids are within normal limits.  Mild for age nonspecific T2 heterogeneity in the cerebral white matter and deep gray matter nuclei. Mild T2 heterogeneity in the pons. There are several tiny chronic lacunar infarcts in the cerebellum (e.g. Series 6, image 5). No chronic blood products or cortical encephalomalacia identified.  Visible internal auditory structures appear normal. Mastoids are clear. Mild paranasal sinus mucosal thickening. No acute orbit or scalp soft tissue findings. Normal bone marrow signal.  IMPRESSION: 1.  No acute intracranial abnormality. 2. Mild to moderate for age chronic small vessel disease.   Electronically Signed   By: Odessa Fleming M.D.   On: 09/17/2014 14:20     Recent Labs Lab 09/17/14 0845 09/18/14 0634  NA 139 136  K 3.7 4.1  CL 104 105  CO2 27 24  GLUCOSE 203* 211*  BUN 27* 19  CREATININE 1.05 0.90  CALCIUM 8.7* 8.3*   Liver Function Tests:  Recent Labs Lab 09/17/14 0845  AST 15  ALT 14*  ALKPHOS 31*  BILITOT 0.6  PROT 6.9  ALBUMIN 3.8   CBC:  Recent Labs Lab 09/17/14 0845  WBC 7.0  NEUTROABS 4.4  HGB 12.4*  HCT 37.3*  MCV 88.4  PLT 293   Cardiac Enzymes:  Recent Labs Lab 09/17/14 0845  TROPONINI <0.03   BNP: BNP (last 3 results) No results for input(s): BNP in the last 8760 hours.  ProBNP (last 3 results) No results for input(s): PROBNP in the last 8760 hours.  CBG:  Recent Labs Lab 09/17/14 0832 09/17/14 1657 09/17/14 2258 09/18/14 0735 09/18/14 1113  GLUCAP 205* 133* 184* 202* 163*    Signed:  Mysti Haley  [facility] 09/18/2014, 1:25 PM

## 2014-09-22 ENCOUNTER — Telehealth: Payer: Self-pay | Admitting: Cardiovascular Disease

## 2014-09-22 NOTE — Telephone Encounter (Signed)
Closed encounter °

## 2014-11-19 ENCOUNTER — Ambulatory Visit (HOSPITAL_COMMUNITY): Payer: Medicare Other | Attending: Cardiology

## 2014-11-19 ENCOUNTER — Other Ambulatory Visit: Payer: Self-pay

## 2014-11-19 DIAGNOSIS — I352 Nonrheumatic aortic (valve) stenosis with insufficiency: Secondary | ICD-10-CM | POA: Diagnosis not present

## 2014-11-19 DIAGNOSIS — Z87891 Personal history of nicotine dependence: Secondary | ICD-10-CM | POA: Diagnosis not present

## 2014-11-19 DIAGNOSIS — Z8249 Family history of ischemic heart disease and other diseases of the circulatory system: Secondary | ICD-10-CM | POA: Diagnosis not present

## 2014-11-19 DIAGNOSIS — I050 Rheumatic mitral stenosis: Secondary | ICD-10-CM | POA: Diagnosis not present

## 2014-11-19 DIAGNOSIS — I350 Nonrheumatic aortic (valve) stenosis: Secondary | ICD-10-CM | POA: Diagnosis not present

## 2014-11-19 DIAGNOSIS — I10 Essential (primary) hypertension: Secondary | ICD-10-CM | POA: Diagnosis not present

## 2014-11-19 DIAGNOSIS — E119 Type 2 diabetes mellitus without complications: Secondary | ICD-10-CM | POA: Insufficient documentation

## 2014-11-19 DIAGNOSIS — E785 Hyperlipidemia, unspecified: Secondary | ICD-10-CM | POA: Diagnosis not present

## 2014-11-19 DIAGNOSIS — I340 Nonrheumatic mitral (valve) insufficiency: Secondary | ICD-10-CM | POA: Diagnosis not present

## 2014-11-19 DIAGNOSIS — I059 Rheumatic mitral valve disease, unspecified: Secondary | ICD-10-CM | POA: Insufficient documentation

## 2014-11-19 DIAGNOSIS — I358 Other nonrheumatic aortic valve disorders: Secondary | ICD-10-CM | POA: Insufficient documentation

## 2014-12-16 ENCOUNTER — Ambulatory Visit (INDEPENDENT_AMBULATORY_CARE_PROVIDER_SITE_OTHER): Payer: Medicare Other | Admitting: Cardiovascular Disease

## 2014-12-16 ENCOUNTER — Encounter: Payer: Self-pay | Admitting: Cardiovascular Disease

## 2014-12-16 VITALS — BP 164/52 | HR 52 | Ht 68.0 in | Wt 143.0 lb

## 2014-12-16 DIAGNOSIS — Z01818 Encounter for other preprocedural examination: Secondary | ICD-10-CM

## 2014-12-16 DIAGNOSIS — E118 Type 2 diabetes mellitus with unspecified complications: Secondary | ICD-10-CM

## 2014-12-16 DIAGNOSIS — I119 Hypertensive heart disease without heart failure: Secondary | ICD-10-CM

## 2014-12-16 DIAGNOSIS — I350 Nonrheumatic aortic (valve) stenosis: Secondary | ICD-10-CM

## 2014-12-16 DIAGNOSIS — E785 Hyperlipidemia, unspecified: Secondary | ICD-10-CM

## 2014-12-16 DIAGNOSIS — D689 Coagulation defect, unspecified: Secondary | ICD-10-CM | POA: Diagnosis not present

## 2014-12-16 MED ORDER — HYDROCHLOROTHIAZIDE 12.5 MG PO CAPS: 12.5000 mg | ORAL_CAPSULE | Freq: Every day | ORAL | Status: DC

## 2014-12-16 NOTE — Patient Instructions (Signed)
Your physician has recommended you make the following change in your medication: start new prescription for hydrochlorothiazide. This has already been sent to your pharmacy.  Your physician has requested that you have a right and left cardiac catheterization. Cardiac catheterization is used to diagnose and/or treat various heart conditions. Doctors may recommend this procedure for a number of different reasons. The most common reason is to evaluate chest pain. Chest pain can be a symptom of coronary artery disease (CAD), and cardiac catheterization can show whether plaque is narrowing or blocking your heart's arteries. This procedure is also used to evaluate the valves, as well as measure the blood flow and oxygen levels in different parts of your heart. For further information please visit [email or web address]   Following your catheterization, you will not be allowed to drive for 3 days.  No lifting, pushing, or pulling greater that 10 pounds is allowed for 1 week.  You will be required to have the following tests prior to the procedure:  1. Blood work-the blood work can be done no more than 7 days prior to the procedure.  It can be done at any [facility] lab.  There is one behind the old [employer] office in [place].

## 2014-12-17 ENCOUNTER — Other Ambulatory Visit: Payer: Self-pay | Admitting: *Deleted

## 2014-12-17 DIAGNOSIS — Z01818 Encounter for other preprocedural examination: Secondary | ICD-10-CM

## 2014-12-17 LAB — COMPREHENSIVE METABOLIC PANEL
ALK PHOS: 25 U/L — AB (ref 40–115)
ALT: 13 U/L (ref 9–46)
AST: 16 U/L (ref 10–35)
Albumin: 3.8 g/dL (ref 3.6–5.1)
BUN: 27 mg/dL — ABNORMAL HIGH (ref 7–25)
CALCIUM: 8.9 mg/dL (ref 8.6–10.3)
CO2: 25 mmol/L (ref 20–31)
Chloride: 99 mmol/L (ref 98–110)
Creat: 1.11 mg/dL (ref 0.70–1.11)
GLUCOSE: 281 mg/dL — AB (ref 65–99)
POTASSIUM: 4.4 mmol/L (ref 3.5–5.3)
Sodium: 133 mmol/L — ABNORMAL LOW (ref 135–146)
Total Bilirubin: 0.6 mg/dL (ref 0.2–1.2)
Total Protein: 6.6 g/dL (ref 6.1–8.1)

## 2014-12-18 ENCOUNTER — Encounter: Payer: Self-pay | Admitting: Cardiovascular Disease

## 2014-12-18 DIAGNOSIS — I119 Hypertensive heart disease without heart failure: Secondary | ICD-10-CM | POA: Insufficient documentation

## 2014-12-18 DIAGNOSIS — I350 Nonrheumatic aortic (valve) stenosis: Secondary | ICD-10-CM | POA: Insufficient documentation

## 2014-12-18 DIAGNOSIS — E785 Hyperlipidemia, unspecified: Secondary | ICD-10-CM | POA: Insufficient documentation

## 2014-12-18 LAB — CBC
HEMATOCRIT: 37.9 % — AB (ref 39.0–52.0)
Hemoglobin: 12.4 g/dL — ABNORMAL LOW (ref 13.0–17.0)
MCH: 29.5 pg (ref 26.0–34.0)
MCHC: 32.7 g/dL (ref 30.0–36.0)
MCV: 90.2 fL (ref 78.0–100.0)
MPV: 10.9 fL (ref 8.6–12.4)
Platelets: 384 10*3/uL (ref 150–400)
RBC: 4.2 MIL/uL — ABNORMAL LOW (ref 4.22–5.81)
RDW: 14 % (ref 11.5–15.5)
WBC: 8 10*3/uL (ref 4.0–10.5)

## 2014-12-18 LAB — PROTIME-INR
INR: 1.23 (ref <1.50)
Prothrombin Time: 15.7 seconds — ABNORMAL HIGH (ref 11.6–15.2)

## 2014-12-18 LAB — APTT: aPTT: 32 seconds (ref 24–37)

## 2014-12-18 NOTE — Progress Notes (Signed)
Patient ID: Juan Pham, male   DOB: 04/05/1931, 81 y.o.   MRN: [id number]      HPI: Juan Pham is a 81 y.o. male who presents to the office today for 4 month cardiology evaluation.  Juan Pham has a history of aortic valve stenosis, hypertension, type 2 diabetes mellitus, hyperlipidemia, and has remote history of bradycardia. He also is status post cataract and retinal surgery. An echo Doppler study in January 2014 showed normal systolic function with EF of 60-65% with grade 1 diastolic dysfunction. He had a mean transvalvular aortic gradient of 30 mm with a peak gradient of 47 mm, not significantly changed from one year ago and his gradient was 24 and 42 mm, respectively. He does have mitral annular calcification.   On 11/26/2013 a one-year followup echo Doppler study  showed an ejection fraction of 60-65%.  There was grade 1 diastolic dysfunction.  He can was noted to have mild-to-moderate aortic valve stenosis with moderate bili, calcified annulus and severely thickened leaflets.  The valve area is 1.19 cm with a mean gradient of 36 and peak gradient of 61 mm.   since I last saw him he was hospitalized by Dr. Riley Kill with altered mental status.  He was found to be hemodynamically stable.  A CT of his head was negative.  He was mildly dehydrated in the setting of uncontrolled diabetes.  He was seen by diabetic coordinator.  He subsequently was referred for a follow-up echo Doppler study on 11/19/2014.  This showed an ejection fraction of 60-65%.  There was grade 2 diastolic dysfunction.  He now had severe aortic valve stenosis with significant restriction of valve mobility and a peak gradient of 81 mm and mean gradient of 40 mmHg.  There was mild to moderate aortic insufficiency.  He had mitral annular calcification with possible mild-to-moderate stenosis.  His left atrium was severely dilated.   He does admit to increasing shortness of breath. He notes vague chest discomfort.  He is depressed  since his grandson at age 4 was killed in a motor vehicle accident recently. He presents for evaluation.    Past Medical History  Diagnosis Date  . Hypertension   . Hyperlipidemia   . Bradycardia   . Diabetes mellitus without complication     type II  . Aortic stenosis, moderate 04/14/12    ECHO  EF 60-65%  ECHO-04/16/11 EF>55% Normal LV size and systolic function. Mildly dilated let atrium. Dystolic dysfunction, probably mild.  . Diastolic dysfunction     grade 1    Past Surgical History  Procedure Laterality Date  . Cateract    . Retna      No Known Allergies  Current Outpatient Prescriptions  Medication Sig Dispense Refill  . amLODipine (NORVASC) 10 MG tablet Take 10 mg by mouth daily.    Marland Kitchen aspirin 81 MG tablet Take 81 mg by mouth daily.    . carvedilol (COREG) 12.5 MG tablet Take 0.5 tablets (6.25 mg total) by mouth 2 (two) times daily with a meal. 1/2 tablet 90 tablet 3  . Cholecalciferol 1000 UNIT/10ML LIQD Take 1 capsule by mouth daily.    Marland Kitchen donepezil (ARICEPT) 5 MG tablet Take 5 mg by mouth daily.    Regino Schultze TEST test strip Inject 8 strips as directed daily.    . fenofibrate micronized (LOFIBRA) 134 MG capsule TAKE 1 BY MOUTH DAILY 90 capsule 2  . glimepiride (AMARYL) 2 MG tablet Take 2 mg by mouth daily with  breakfast.     . glipiZIDE (GLUCOTROL) 5 MG tablet Take 20 mg by mouth daily before breakfast.    . HYDROcodone-acetaminophen (NORCO) 7.5-325 MG per tablet Take 1 tablet by mouth daily.    . insulin NPH-regular (NOVOLIN 70/30) (70-30) 100 UNIT/ML injection Inject into the skin 2 (two) times daily with a meal. 25 units in the AM and 5 units in the PM.    . simvastatin (ZOCOR) 20 MG tablet Take 20 mg by mouth daily.    . hydrochlorothiazide (MICROZIDE) 12.5 MG capsule Take 1 capsule (12.5 mg total) by mouth daily. 90 capsule 3   No current facility-administered medications for this visit.    Socially he is widowed has 3 children. There is a remote tobacco  history having quit greater than 20 years ago.  ROS General: Negative; No fevers, chills, or night sweats;  HEENT: Negative; No changes in vision or hearing, sinus congestion, difficulty swallowing Pulmonary: Negative; No cough, wheezing, shortness of breath, hemoptysis Cardiovascular: See history of present illness; Positive for leg swelling GI: Positive for GERD; No nausea, vomiting, diarrhea, or abdominal pain GU: Negative; No dysuria, hematuria, or difficulty voiding Musculoskeletal: Negative; no myalgias, joint pain, or weakness Hematologic/Oncology: Negative; no easy bruising, bleeding Endocrine: Positive for type 2 diabetes mellitus Neuro:  Positive for mild dementia Skin: Negative; No rashes or skin lesions Psychiatric:  Positive for depression Sleep: Negative; No snoring, daytime sleepiness, hypersomnolence, bruxism, restless legs, hypnogognic hallucinations, no cataplexy Other comprehensive 14 point system review is negative.   PE BP 164/52 mmHg  Pulse 52  Ht 5' 8"  (1.727 m)  Wt 143 lb (64.864 kg)  BMI 21.75 kg/m2  Repeat blood pressure meds when checked by me was 178/70  Wt Readings from Last 3 Encounters:  12/16/14 143 lb (64.864 kg)  09/17/14 147 lb 0.8 oz (66.7 kg)  09/17/14 170 lb (77.111 kg)   General: Alert, oriented, no distress.  Skin: normal turgor, no rashes HEENT: Normocephalic, atraumatic. Pupils round and reactive; sclera anicteric;no lid lag.  Nose without nasal septal hypertrophy; small aperture to the left nares compared to the right. Mouth/Parynx benign; Mallinpatti scale 2 Neck: No JVD, he has transmitted murmurs to his carotids bilaterally. Lungs: clear to ausculatation and percussion; no wheezing or rales Chest wall: No tenderness to palpation Heart: RRR, s1 s2 normal; 4-9/6 systolic murmur in the aortic area and 1/6 aortic insufficiency.  No S3 gallop.  No diastolic rub thrills or heaves  Abdomen:  Left inguinal hernia; soft, nontender; no  hepatosplenomehaly, BS+; abdominal aorta nontender and not dilated by palpation. Back: No CVA tenderness Pulses 2+ Extremities: Moderate right lower extremity varicosities. Mild left lower extremity varicosity; 1+ edema of the ankles; no clubbing cyanosis, Homan's sign negative  Neurologic: grossly nonfocal; cranial nerves grossly normal. Psychological: Affect flat.  ECG (independently read by me):  Sinus bradycardia 52 bpm.  No significant ST changes.  Early transition.  May 2016ECG (independently read by me): Normal sinus rhythm at 68 bpm.  QTc interval 424 ms.  No significant ST segment changes.  September 2015 ECG: Sinus bradycardia 55 beats per minute.  No ectopy.  Normal intervals.  04/21/2013 ECG (independently read by me): Normal sinus rhythm at 60 beats per minute;incomplete right bundle branch block. Normal intervals.  ECG from July 2014: Sinus rhythm at 53 beats per minute. QTc interval 405 msec.  LABS: BMP Latest Ref Rng 12/16/2014 09/18/2014 09/17/2014  Glucose 65 - 99 mg/dL 281(H) 211(H) 203(H)  BUN 7 - 25  mg/dL 27(H) 19 27(H)  Creatinine 0.70 - 1.11 mg/dL 1.11 0.90 1.05  BUN/Creat Ratio 10 - 22 - - -  Sodium 135 - 146 mmol/L 133(L) 136 139  Potassium 3.5 - 5.3 mmol/L 4.4 4.1 3.7  Chloride 98 - 110 mmol/L 99 105 104  CO2 20 - 31 mmol/L 25 24 27   Calcium 8.6 - 10.3 mg/dL 8.9 8.3(L) 8.7(L)   Hepatic Function Latest Ref Rng 12/16/2014 09/17/2014 06/16/2014  Total Protein 6.1 - 8.1 g/dL 6.6 6.9 7.2  Albumin 3.6 - 5.1 g/dL 3.8 3.8 4.3  AST 10 - 35 U/L 16 15 19   ALT 9 - 46 U/L 13 14(L) 15  Alk Phosphatase 40 - 115 U/L 25(L) 31(L) 29(L)  Total Bilirubin 0.2 - 1.2 mg/dL 0.6 0.6 0.7   CBC Latest Ref Rng 12/16/2014 09/17/2014 06/16/2014  WBC 4.0 - 10.5 K/uL 8.0 7.0 8.2  Hemoglobin 13.0 - 17.0 g/dL 12.4(L) 12.4(L) 12.5(L)  Hematocrit 39.0 - 52.0 % 37.9(L) 37.3(L) 37.6(L)  Platelets 150 - 400 K/uL 384 293 386   Lab Results  Component Value Date   MCV 90.2 12/16/2014   MCV 88.4  09/17/2014   MCV 89.5 06/16/2014   Lab Results  Component Value Date   TSH 1.070 09/17/2014    Lipid Panel     Component Value Date/Time   CHOL  04/12/2009 0218    121        ATP III CLASSIFICATION:  <200     mg/dL   Desirable  200-239  mg/dL   Borderline High  >=240    mg/dL   High          TRIG 118 04/12/2009 0218   HDL 28* 04/12/2009 0218   CHOLHDL 4.3 04/12/2009 0218   VLDL 24 04/12/2009 0218   LDLCALC  04/12/2009 0218    69        Total Cholesterol/HDL:CHD Risk Coronary Heart Disease Risk Table                     Men   Women  1/2 Average Risk   3.4   3.3  Average Risk       5.0   4.4  2 X Average Risk   9.6   7.1  3 X Average Risk  23.4   11.0        Use the calculated Patient Ratio above and the CHD Risk Table to determine the patient's CHD Risk.        ATP III CLASSIFICATION (LDL):  <100     mg/dL   Optimal  100-129  mg/dL   Near or Above                    Optimal  130-159  mg/dL   Borderline  160-189  mg/dL   High  >190     mg/dL   Very High     RADIOLOGY: No results found.    ASSESSMENT AND PLAN: Juan Pham is an 81 year old white male who has a history of hypertension, hyperlipidemia, type 2 diabetes mellitus, as well as  previously documented moderate aortic valve stenosis. An echo Doppler study January 2014 revealed a mean transvalvular aortic gradient of 30 mm with a peak gradient of 47 mm and a calculated aortic valve area 1.1 cm.  His echo from August 2015 revealed mild to moderate aortic stenosis with a mean gradient  36 and peak gradient of 61 mm.  His most recent  echo last month now shows progression to severe aortic stenosis with a gradient of 81 and mean gradient of 40.  There is mild-to-moderate aortic insufficiency.  He continues to have normal systolic function with grade 2 diastolic dysfunction.  He has noticed increasing shortness of breath and vague chest discomfort.  He continues to have mild ankle edema and his blood pressure is  elevated despite taking amlodipine 10 mg, and carvedilol 6.25 mg twice a day.  I am adding HCTZ 12.5 mg daily to his medical regimen. He is diabetic on Amaryl and Glucotrol in addition to NPH insulin.  He is on simvastatin for hyperlipidemia with target LDL less than 70. He has had issues with depression since his grandsons death. Has issues with mild dementia. He is here in the office with his daughter today. I had a long discussion with him regarding his progression of aortic valve disease. We discussed options of being aggressive versus conservative strategy and his neurologic status.Marland Kitchen He may be a candidate for TAVR rather than open valve replacement. After long discussion concerning the risks benefits of the procedure the daughter in particular wants aggressive treatment protocol. The risks and benefits of a cardiac catheterization including, but not limited to, death, stroke, MI, kidney damage and bleeding were discussed with the patient who indicates understanding and agrees to proceed.   I will schedule him for a right and left heart catheterization next week at [facility].   Laboratory was evaluated today for this preoperative assessment.  Time spent: 40 minutes  Troy Sine, MD, [facility]  12/18/2014 2:02 PM

## 2014-12-22 ENCOUNTER — Ambulatory Visit (HOSPITAL_COMMUNITY)
Admission: RE | Admit: 2014-12-22 | Discharge: 2014-12-22 | Disposition: A | Discharge status: Home / Self Care | Payer: Medicare Other | Source: Ambulatory Visit | Attending: Cardiovascular Disease | Admitting: Cardiovascular Disease

## 2014-12-22 ENCOUNTER — Encounter (HOSPITAL_COMMUNITY)
Admission: RE | Disposition: A | Discharge status: Home / Self Care | Payer: Self-pay | Source: Ambulatory Visit | Attending: Cardiovascular Disease

## 2014-12-22 DIAGNOSIS — Z794 Long term (current) use of insulin: Secondary | ICD-10-CM | POA: Diagnosis not present

## 2014-12-22 DIAGNOSIS — I10 Essential (primary) hypertension: Secondary | ICD-10-CM | POA: Insufficient documentation

## 2014-12-22 DIAGNOSIS — E119 Type 2 diabetes mellitus without complications: Secondary | ICD-10-CM | POA: Diagnosis not present

## 2014-12-22 DIAGNOSIS — Z7982 Long term (current) use of aspirin: Secondary | ICD-10-CM | POA: Insufficient documentation

## 2014-12-22 DIAGNOSIS — E785 Hyperlipidemia, unspecified: Secondary | ICD-10-CM | POA: Diagnosis not present

## 2014-12-22 DIAGNOSIS — Z01818 Encounter for other preprocedural examination: Secondary | ICD-10-CM

## 2014-12-22 DIAGNOSIS — I350 Nonrheumatic aortic (valve) stenosis: Secondary | ICD-10-CM | POA: Insufficient documentation

## 2014-12-22 HISTORY — PX: CARDIAC CATHETERIZATION: SHX172

## 2014-12-22 LAB — POCT I-STAT 3, ART BLOOD GAS (G3+)
Acid-Base Excess: 1 mmol/L (ref 0.0–2.0)
Bicarbonate: 25 mEq/L — ABNORMAL HIGH (ref 20.0–24.0)
O2 SAT: 93 %
PH ART: 7.429 (ref 7.350–7.450)
TCO2: 26 mmol/L (ref 0–100)
pCO2 arterial: 37.7 mmHg (ref 35.0–45.0)
pO2, Arterial: 65 mmHg — ABNORMAL LOW (ref 80.0–100.0)

## 2014-12-22 LAB — POCT I-STAT 3, VENOUS BLOOD GAS (G3P V)
Bicarbonate: 25.3 mEq/L — ABNORMAL HIGH (ref 20.0–24.0)
O2 Saturation: 68 %
PH VEN: 7.401 — AB (ref 7.250–7.300)
TCO2: 26 mmol/L (ref 0–100)
pCO2, Ven: 40.7 mmHg — ABNORMAL LOW (ref 45.0–50.0)
pO2, Ven: 35 mmHg (ref 30.0–45.0)

## 2014-12-22 LAB — GLUCOSE, CAPILLARY
GLUCOSE-CAPILLARY: 229 mg/dL — AB (ref 65–99)
Glucose-Capillary: 169 mg/dL — ABNORMAL HIGH (ref 65–99)

## 2014-12-22 SURGERY — RIGHT/LEFT HEART CATH AND CORONARY ANGIOGRAPHY
Anesthesia: LOCAL

## 2014-12-22 MED ORDER — SODIUM CHLORIDE 0.9 % IV SOLN: INTRAVENOUS | Status: AC

## 2014-12-22 MED ORDER — IOHEXOL 350 MG/ML SOLN
INTRAVENOUS | Status: DC | PRN
  Administered 2014-12-22: 65 mL via INTRAVENOUS

## 2014-12-22 MED ORDER — LIDOCAINE HCL (PF) 1 % IJ SOLN
INTRAMUSCULAR | Status: AC
  Filled 2014-12-22: qty 30

## 2014-12-22 MED ORDER — MIDAZOLAM HCL 2 MG/2ML IJ SOLN
INTRAMUSCULAR | Status: AC
  Filled 2014-12-22: qty 4

## 2014-12-22 MED ORDER — SODIUM CHLORIDE 0.9 % IV SOLN
INTRAVENOUS | Status: DC
  Administered 2014-12-22: 11:00:00 via INTRAVENOUS

## 2014-12-22 MED ORDER — DIAZEPAM 5 MG PO TABS: 5.0000 mg | ORAL_TABLET | ORAL | Status: DC

## 2014-12-22 MED ORDER — LIDOCAINE HCL (PF) 1 % IJ SOLN
INTRAMUSCULAR | Status: DC | PRN
  Administered 2014-12-22: 14:00:00

## 2014-12-22 MED ORDER — SODIUM CHLORIDE 0.9 % IV SOLN: 250.0000 mL | INTRAVENOUS | Status: DC | PRN

## 2014-12-22 MED ORDER — SODIUM CHLORIDE 0.9 % IJ SOLN: 3.0000 mL | INTRAMUSCULAR | Status: DC | PRN

## 2014-12-22 MED ORDER — ACETAMINOPHEN 325 MG PO TABS: 650.0000 mg | ORAL_TABLET | ORAL | Status: DC | PRN

## 2014-12-22 MED ORDER — SODIUM CHLORIDE 0.9 % IJ SOLN: 3.0000 mL | Freq: Two times a day (BID) | INTRAMUSCULAR | Status: DC

## 2014-12-22 MED ORDER — MIDAZOLAM HCL 2 MG/2ML IJ SOLN
INTRAMUSCULAR | Status: DC | PRN
  Administered 2014-12-22: 1 mg via INTRAVENOUS

## 2014-12-22 MED ORDER — ONDANSETRON HCL 4 MG/2ML IJ SOLN: 4.0000 mg | Freq: Four times a day (QID) | INTRAMUSCULAR | Status: DC | PRN

## 2014-12-22 MED ORDER — ASPIRIN 81 MG PO CHEW: 81.0000 mg | CHEWABLE_TABLET | ORAL | Status: DC

## 2014-12-22 SURGICAL SUPPLY — 14 items
CATH INFINITI 5FR ANG PIGTAIL (CATHETERS) ×2 IMPLANT
CATH INFINITI 5FR JL4 (CATHETERS) ×2 IMPLANT
CATH INFINITI JR4 5F (CATHETERS) ×2 IMPLANT
CATH SWAN GANZ 7F STRAIGHT (CATHETERS) ×2 IMPLANT
KIT HEART LEFT (KITS) ×3 IMPLANT
KIT HEART RIGHT NAMIC (KITS) ×3 IMPLANT
PACK CARDIAC CATHETERIZATION (CUSTOM PROCEDURE TRAY) ×3 IMPLANT
SHEATH PINNACLE 5F 10CM (SHEATH) IMPLANT
SHEATH PINNACLE 6F 10CM (SHEATH) ×2 IMPLANT
SHEATH PINNACLE 7F 10CM (SHEATH) ×2 IMPLANT
SYR MEDRAD MARK V 150ML (SYRINGE) ×3 IMPLANT
TRANSDUCER W/STOPCOCK (MISCELLANEOUS) ×6 IMPLANT
WIRE EMERALD 3MM-J .035X150CM (WIRE) ×2 IMPLANT
WIRE EMERALD ST .035X260CM (WIRE) ×2 IMPLANT

## 2014-12-22 NOTE — Interval H&P Note (Signed)
Cath Lab Visit (complete for each Cath Lab visit)  Clinical Evaluation Leading to the Procedure:   ACS: No.  Non-ACS:    Anginal Classification: CCS III  Anti-ischemic medical therapy: Maximal Therapy (2 or more classes of medications)  Non-Invasive Test Results: No non-invasive testing performed  Prior CABG: No previous CABG      History and Physical Interval Note:  12/22/2014 1:12 PM  Juan Pham  has presented today for surgery, with the diagnosis of aortic stenosis  The various methods of treatment have been discussed with the patient and family. After consideration of risks, benefits and other options for treatment, the patient has consented to  Procedure(s): Right/Left Heart Cath and Coronary Angiography (N/A) as a surgical intervention .  The patient's history has been reviewed, patient examined, no change in status, stable for surgery.  I have reviewed the patient's chart and labs.  Questions were answered to the patient's satisfaction.     KELLY,THOMAS A

## 2014-12-22 NOTE — Discharge Instructions (Signed)
Arteriogram °Care After °These instructions give you information on caring for yourself after your procedure. Your doctor may also give you more specific instructions. Call your doctor if you have any problems or questions after your procedure. °HOME CARE °· Do not bathe, swim, or use a hot tub until directed by your doctor. You can shower. °· Do not lift anything heavier than 10 pounds (about a gallon of milk) for 2 days. °· Do not walk a lot, run, or drive for 2 days. °· Return to normal activities in 2 days or as told by your doctor. °Finding out the results of your test °Ask when your test results will be ready. Make sure you get your test results. °GET HELP RIGHT AWAY IF:  °· You have fever. °· You have more pain in your leg. °· The leg that was cut is: °¨ Bleeding. °¨ Puffy (swollen) or red. °¨ Cold. °¨ Pale or changes color. °¨ Weak. °¨ Tingly or numb. °If you go to the Emergency Room, tell your nurse that you have had an arteriogram. Take this paper with you to show the nurse. °MAKE SURE YOU: °· Understand these instructions. °· Will watch your condition. °· Will get help right away if you are not doing well or get worse. °Document Released: 06/22/2008 Document Revised: 03/31/2013 Document Reviewed: 06/22/2008 °[employer]® Patient Information ©2015 [employer]. This information is not intended to replace advice given to you by your health care provider. Make sure you discuss any questions you have with your health care provider. ° °

## 2014-12-22 NOTE — Progress Notes (Signed)
Site area: rt groin fa and fv sheaths Site Prior to Removal:  Level 0 Pressure Applied For:  30 minutes Manual:   yes Patient Status During Pull:  stable Post Pull Site:  Level   0; faint bruising around stick site Post Pull Instructions Given:  yes Post Pull Pulses Present: yes Dressing Applied:  tegaderm Bedrest begins @  1505 Comments:  0

## 2014-12-22 NOTE — H&P (View-Only) (Signed)
Patient ID: Juan Pham, male   DOB: 05-25-1930, 81 y.o.   MRN: [id number]      HPI: Juan Pham is a 81 y.o. male who presents to the office today for 4 month cardiology evaluation.  Juan Pham has a history of aortic valve stenosis, hypertension, type 2 diabetes mellitus, hyperlipidemia, and has remote history of bradycardia. He also is status post cataract and retinal surgery. An echo Doppler study in January 2014 showed normal systolic function with EF of 60-65% with grade 1 diastolic dysfunction. He had a mean transvalvular aortic gradient of 30 mm with a peak gradient of 47 mm, not significantly changed from one year ago and his gradient was 24 and 42 mm, respectively. He does have mitral annular calcification.   On 11/26/2013 a one-year followup echo Doppler study  showed an ejection fraction of 60-65%.  There was grade 1 diastolic dysfunction.  He can was noted to have mild-to-moderate aortic valve stenosis with moderate bili, calcified annulus and severely thickened leaflets.  The valve area is 1.19 cm with a mean gradient of 36 and peak gradient of 61 mm.   since I last saw him he was hospitalized by Dr. Riley Pham with altered mental status.  He was found to be hemodynamically stable.  A CT of his head was negative.  He was mildly dehydrated in the setting of uncontrolled diabetes.  He was seen by diabetic coordinator.  He subsequently was referred for a follow-up echo Doppler study on 11/19/2014.  This showed an ejection fraction of 60-65%.  There was grade 2 diastolic dysfunction.  He now had severe aortic valve stenosis with significant restriction of valve mobility and a peak gradient of 81 mm and mean gradient of 40 mmHg.  There was mild to moderate aortic insufficiency.  He had mitral annular calcification with possible mild-to-moderate stenosis.  His left atrium was severely dilated.   He does admit to increasing shortness of breath. He notes vague chest discomfort.  He is depressed  since his grandson at age 21 was killed in a motor vehicle accident recently. He presents for evaluation.    Past Medical History  Diagnosis Date  . Hypertension   . Hyperlipidemia   . Bradycardia   . Diabetes mellitus without complication     type II  . Aortic stenosis, moderate 04/14/12    ECHO  EF 60-65%  ECHO-04/16/11 EF>55% Normal LV size and systolic function. Mildly dilated let atrium. Dystolic dysfunction, probably mild.  . Diastolic dysfunction     grade 1    Past Surgical History  Procedure Laterality Date  . Cateract    . Retna      No Known Allergies  Current Outpatient Prescriptions  Medication Sig Dispense Refill  . amLODipine (NORVASC) 10 MG tablet Take 10 mg by mouth daily.    Marland Kitchen aspirin 81 MG tablet Take 81 mg by mouth daily.    . carvedilol (COREG) 12.5 MG tablet Take 0.5 tablets (6.25 mg total) by mouth 2 (two) times daily with a meal. 1/2 tablet 90 tablet 3  . Cholecalciferol 1000 UNIT/10ML LIQD Take 1 capsule by mouth daily.    Marland Kitchen donepezil (ARICEPT) 5 MG tablet Take 5 mg by mouth daily.    Juan Pham TEST test strip Inject 8 strips as directed daily.    . fenofibrate micronized (LOFIBRA) 134 MG capsule TAKE 1 BY MOUTH DAILY 90 capsule 2  . glimepiride (AMARYL) 2 MG tablet Take 2 mg by mouth daily with  breakfast.     . glipiZIDE (GLUCOTROL) 5 MG tablet Take 20 mg by mouth daily before breakfast.    . HYDROcodone-acetaminophen (NORCO) 7.5-325 MG per tablet Take 1 tablet by mouth daily.    . insulin NPH-regular (NOVOLIN 70/30) (70-30) 100 UNIT/ML injection Inject into the skin 2 (two) times daily with a meal. 25 units in the AM and 5 units in the PM.    . simvastatin (ZOCOR) 20 MG tablet Take 20 mg by mouth daily.    . hydrochlorothiazide (MICROZIDE) 12.5 MG capsule Take 1 capsule (12.5 mg total) by mouth daily. 90 capsule 3   No current facility-administered medications for this visit.    Socially he is widowed has 3 children. There is a remote tobacco  history having quit greater than 20 years ago.  ROS General: Negative; No fevers, chills, or night sweats;  HEENT: Negative; No changes in vision or hearing, sinus congestion, difficulty swallowing Pulmonary: Negative; No cough, wheezing, shortness of breath, hemoptysis Cardiovascular: See history of present illness; Positive for leg swelling GI: Positive for GERD; No nausea, vomiting, diarrhea, or abdominal pain GU: Negative; No dysuria, hematuria, or difficulty voiding Musculoskeletal: Negative; no myalgias, joint pain, or weakness Hematologic/Oncology: Negative; no easy bruising, bleeding Endocrine: Positive for type 2 diabetes mellitus Neuro:  Positive for mild dementia Skin: Negative; No rashes or skin lesions Psychiatric:  Positive for depression Sleep: Negative; No snoring, daytime sleepiness, hypersomnolence, bruxism, restless legs, hypnogognic hallucinations, no cataplexy Other comprehensive 14 point system review is negative.   PE BP 164/52 mmHg  Pulse 52  Ht 5' 8"  (1.727 m)  Wt 143 lb (64.864 kg)  BMI 21.75 kg/m2  Repeat blood pressure meds when checked by me was 178/70  Wt Readings from Last 3 Encounters:  12/16/14 143 lb (64.864 kg)  09/17/14 147 lb 0.8 oz (66.7 kg)  09/17/14 170 lb (77.111 kg)   General: Alert, oriented, no distress.  Skin: normal turgor, no rashes HEENT: Normocephalic, atraumatic. Pupils round and reactive; sclera anicteric;no lid lag.  Nose without nasal septal hypertrophy; small aperture to the left nares compared to the right. Mouth/Parynx benign; Mallinpatti scale 2 Neck: No JVD, he has transmitted murmurs to his carotids bilaterally. Lungs: clear to ausculatation and percussion; no wheezing or rales Chest wall: No tenderness to palpation Heart: RRR, s1 s2 normal; 6-3/7 systolic murmur in the aortic area and 1/6 aortic insufficiency.  No S3 gallop.  No diastolic rub thrills or heaves  Abdomen:  Left inguinal hernia; soft, nontender; no  hepatosplenomehaly, BS+; abdominal aorta nontender and not dilated by palpation. Back: No CVA tenderness Pulses 2+ Extremities: Moderate right lower extremity varicosities. Mild left lower extremity varicosity; 1+ edema of the ankles; no clubbing cyanosis, Homan's sign negative  Neurologic: grossly nonfocal; cranial nerves grossly normal. Psychological: Affect flat.  ECG (independently read by me):  Sinus bradycardia 52 bpm.  No significant ST changes.  Early transition.  May 2016ECG (independently read by me): Normal sinus rhythm at 68 bpm.  QTc interval 424 ms.  No significant ST segment changes.  September 2015 ECG: Sinus bradycardia 55 beats per minute.  No ectopy.  Normal intervals.  04/21/2013 ECG (independently read by me): Normal sinus rhythm at 60 beats per minute;incomplete right bundle branch block. Normal intervals.  ECG from July 2014: Sinus rhythm at 53 beats per minute. QTc interval 405 msec.  LABS: BMP Latest Ref Rng 12/16/2014 09/18/2014 09/17/2014  Glucose 65 - 99 mg/dL 281(H) 211(H) 203(H)  BUN 7 - 25  mg/dL 27(H) 19 27(H)  Creatinine 0.70 - 1.11 mg/dL 1.11 0.90 1.05  BUN/Creat Ratio 10 - 22 - - -  Sodium 135 - 146 mmol/L 133(L) 136 139  Potassium 3.5 - 5.3 mmol/L 4.4 4.1 3.7  Chloride 98 - 110 mmol/L 99 105 104  CO2 20 - 31 mmol/L 25 24 27   Calcium 8.6 - 10.3 mg/dL 8.9 8.3(L) 8.7(L)   Hepatic Function Latest Ref Rng 12/16/2014 09/17/2014 06/16/2014  Total Protein 6.1 - 8.1 g/dL 6.6 6.9 7.2  Albumin 3.6 - 5.1 g/dL 3.8 3.8 4.3  AST 10 - 35 U/L 16 15 19   ALT 9 - 46 U/L 13 14(L) 15  Alk Phosphatase 40 - 115 U/L 25(L) 31(L) 29(L)  Total Bilirubin 0.2 - 1.2 mg/dL 0.6 0.6 0.7   CBC Latest Ref Rng 12/16/2014 09/17/2014 06/16/2014  WBC 4.0 - 10.5 K/uL 8.0 7.0 8.2  Hemoglobin 13.0 - 17.0 g/dL 12.4(L) 12.4(L) 12.5(L)  Hematocrit 39.0 - 52.0 % 37.9(L) 37.3(L) 37.6(L)  Platelets 150 - 400 K/uL 384 293 386   Lab Results  Component Value Date   MCV 90.2 12/16/2014   MCV 88.4  09/17/2014   MCV 89.5 06/16/2014   Lab Results  Component Value Date   TSH 1.070 09/17/2014    Lipid Panel     Component Value Date/Time   CHOL  04/12/2009 0218    121        ATP III CLASSIFICATION:  <200     mg/dL   Desirable  200-239  mg/dL   Borderline High  >=240    mg/dL   High          TRIG 118 04/12/2009 0218   HDL 28* 04/12/2009 0218   CHOLHDL 4.3 04/12/2009 0218   VLDL 24 04/12/2009 0218   LDLCALC  04/12/2009 0218    69        Total Cholesterol/HDL:CHD Risk Coronary Heart Disease Risk Table                     Men   Women  1/2 Average Risk   3.4   3.3  Average Risk       5.0   4.4  2 X Average Risk   9.6   7.1  3 X Average Risk  23.4   11.0        Use the calculated Patient Ratio above and the CHD Risk Table to determine the patient's CHD Risk.        ATP III CLASSIFICATION (LDL):  <100     mg/dL   Optimal  100-129  mg/dL   Near or Above                    Optimal  130-159  mg/dL   Borderline  160-189  mg/dL   High  >190     mg/dL   Very High     RADIOLOGY: No results found.    ASSESSMENT AND PLAN: Mr. Schulenburg is an 81 year old white male who has a history of hypertension, hyperlipidemia, type 2 diabetes mellitus, as well as  previously documented moderate aortic valve stenosis. An echo Doppler study January 2014 revealed a mean transvalvular aortic gradient of 30 mm with a peak gradient of 47 mm and a calculated aortic valve area 1.1 cm.  His echo from August 2015 revealed mild to moderate aortic stenosis with a mean gradient  36 and peak gradient of 61 mm.  His most recent  echo last month now shows progression to severe aortic stenosis with a gradient of 81 and mean gradient of 40.  There is mild-to-moderate aortic insufficiency.  He continues to have normal systolic function with grade 2 diastolic dysfunction.  He has noticed increasing shortness of breath and vague chest discomfort.  He continues to have mild ankle edema and his blood pressure is  elevated despite taking amlodipine 10 mg, and carvedilol 6.25 mg twice a day.  I am adding HCTZ 12.5 mg daily to his medical regimen. He is diabetic on Amaryl and Glucotrol in addition to NPH insulin.  He is on simvastatin for hyperlipidemia with target LDL less than 70. He has had issues with depression since his grandsons death. Has issues with mild dementia. He is here in the office with his daughter today. I had a long discussion with him regarding his progression of aortic valve disease. We discussed options of being aggressive versus conservative strategy and his neurologic status.Marland Kitchen He may be a candidate for TAVR rather than open valve replacement. After long discussion concerning the risks benefits of the procedure the daughter in particular wants aggressive treatment protocol. The risks and benefits of a cardiac catheterization including, but not limited to, death, stroke, MI, kidney damage and bleeding were discussed with the patient who indicates understanding and agrees to proceed.   I will schedule him for a right and left heart catheterization next week at [facility].   Laboratory was evaluated today for this preoperative assessment.  Time spent: 40 minutes  Troy Sine, MD, [facility]  12/18/2014 2:02 PM

## 2014-12-23 ENCOUNTER — Encounter (HOSPITAL_COMMUNITY): Payer: Self-pay | Admitting: Cardiovascular Disease

## 2014-12-28 ENCOUNTER — Other Ambulatory Visit: Payer: Self-pay | Admitting: *Deleted

## 2014-12-28 ENCOUNTER — Encounter: Payer: Self-pay | Admitting: Thoracic Surgery (Cardiothoracic Vascular Surgery)

## 2014-12-28 ENCOUNTER — Institutional Professional Consult (permissible substitution) (INDEPENDENT_AMBULATORY_CARE_PROVIDER_SITE_OTHER): Payer: Medicare Other | Admitting: Thoracic Surgery (Cardiothoracic Vascular Surgery)

## 2014-12-28 VITALS — BP 151/59 | HR 57 | Resp 16 | Ht 70.0 in | Wt 140.0 lb

## 2014-12-28 DIAGNOSIS — I350 Nonrheumatic aortic (valve) stenosis: Secondary | ICD-10-CM

## 2014-12-28 DIAGNOSIS — F0390 Unspecified dementia without behavioral disturbance: Secondary | ICD-10-CM | POA: Insufficient documentation

## 2014-12-28 DIAGNOSIS — I639 Cerebral infarction, unspecified: Secondary | ICD-10-CM

## 2014-12-28 NOTE — Progress Notes (Signed)
[facility]  MULTIDISCIPLINARY HEART VALVE CLINIC  CARDIOTHORACIC SURGERY CONSULTATION REPORT  Referring Provider is Lennette Bihari, MD PCP is Cassell Smiles., MD  Chief Complaint  Patient presents with  . Aortic Stenosis  . Shortness of Breath    HPI:  Patient is an 81 year old male with history of aortic stenosis, hypertension, hyperlipidemia, and type 2 diabetes mellitus who has been referred for surgical consultation to discuss treatment options for management of severe aortic stenosis.  Patient has been followed by Dr. Tresa Endo for several years with remote history of bradycardia, hypertension, and hyperlipidemia. He was noted to have a heart murmur on physical exam and underwent transthoracic echocardiograms demonstrating the presence of aortic stenosis. Echocardiogram performed in January 2014 revealed a peak velocity across the aortic valve measured 3.4 m/s corresponding to mean transvalvular gradient of 30 mmHg. Left ventricular systolic function was normal at that time with ejection fraction estimated 60-65%.  In June 2016 the patient was hospitalized briefly with an episode of altered mental status.  The patient was noted to have problems with worsening confusion. His daughter noted that he was wearing the same closed several days in a row. The patient was noted to have voided urine on the floor of his kitchen, thinking that he was in the bathroom. The patient has had problems with recollection of any contributing factors or associated symptoms. There was no reported history of syncope, and the patient did not have any symptoms of chest pain or shortness of breath.  After presentation to the hospital, brain CT scan was unremarkable and the patient's blood glucose was 203 at the time of hospital admission. Chest x-ray and EKGs were normal as were all blood work.  He was noted to have problems with chronic memory loss and dehydration was felt possibly to be a contributing  factor.  He subsequently underwent a follow-up echocardiogram on 11/19/2014. This showed progression of severity of aortic stenosis with peak velocity across the aortic valve measuring as high as 4.5 m/s corresponding to mean transvalvular gradient ranging between 40 and 50 mmHg. Left ventricular systolic function remains preserved with ejection fraction estimated 60-65%. The patient was seen in follow-up by Dr. Tresa Endo and subsequently underwent diagnostic cardiac catheterization on 12/22/2014.  Catheterization confirmed the presence of severe aortic stenosis with mean transvalvular gradient measured at catheterization 39.4 mmHg and valve area calculated 0.85 cm. Left ventricular systolic function was normal. Coronary angiography revealed moderate nonobstructive coronary artery disease with 50% stenosis of the first obtuse marginal branch of the left circumflex coronary artery, 50% stenosis of the mid left circumflex coronary artery, and 40% stenosis of the right coronary artery. The patient was referred for surgical consultation.  The patient is a widower and lives alone in [place]. His daughter lives next door and checks on him regularly.  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 At present the patient is oriented to name only. He is accompanied by his daughter and granddaughter for his office consultation visit. He does not understand why he was referred for consultation but he seems somewhat anxious about it. When he was asked when he retired from working he replied 1952. He states that he was born in [birth year]. He does not know who is present. He cannot recall what he did yesterday but he states that he enjoys being in the garden. He states that he still drives an automobile. When he is asked what if anything has  been bothering him recently he states that sometimes he feels well and sometimes he doesn't. Answers regarding the presence or  severity of shortness of breath are inconsistent. He has been reported to have dizzy spells. There is no history of any syncopal episodes. He denies any history of chest pain. He does report occasional pain in his neck that has been attributed to arthritis.  The patient's daughter contributes significant information noting that the patient's cognitive status has declined substantially over the past year. She attributes this to stress related to the loss of the patient's grandson. The patient has been noted to get increasingly confused and got lost while driving his automobile last January. He has not wandered off from the house recently.  Past Medical History  Diagnosis Date  . Hypertension   . Hyperlipidemia   . Bradycardia   . Diabetes mellitus without complication     type II  . Aortic stenosis   . Diastolic dysfunction     grade 1    Past Surgical History  Procedure Laterality Date  . Cateract    . Retna    . Cardiac catheterization N/A 12/22/2014    Procedure: Right/Left Heart Cath and Coronary Angiography;  Surgeon: Lennette Bihari, MD;  Location: [facility] INVASIVE CV LAB;  Service: Cardiovascular;  Laterality: N/A;    Family History  Problem Relation Age of Onset  . Heart disease Mother     Social History   Social History  . Marital Status: Widowed    Spouse Name: N/A  . Number of Children: N/A  . Years of Education: N/A   Occupational History  . Not on file.   Social History Main Topics  . Smoking status: Former Smoker    Types: Pipe    Quit date: 10/21/1982  . Smokeless tobacco: Never Used  . Alcohol Use: No  . Drug Use: No  . Sexual Activity: Not on file   Other Topics Concern  . Not on file   Social History Narrative    Current Outpatient Prescriptions  Medication Sig Dispense Refill  . amLODipine (NORVASC) 10 MG tablet Take 10 mg by mouth daily.    Marland Kitchen aspirin 81 MG chewable tablet Chew by mouth daily.    . carvedilol (COREG) 12.5 MG tablet Take 0.5 tablets  (6.25 mg total) by mouth 2 (two) times daily with a meal. 1/2 tablet 90 tablet 3  . donepezil (ARICEPT) 5 MG tablet Take 5 mg by mouth daily.    Lajoyce Corners TEST test strip Inject 8 strips as directed daily.    . fenofibrate micronized (LOFIBRA) 134 MG capsule TAKE 1 BY MOUTH DAILY 90 capsule 2  . glimepiride (AMARYL) 2 MG tablet Take 2 mg by mouth daily with breakfast.     . glipiZIDE (GLUCOTROL) 5 MG tablet Take 20 mg by mouth daily before breakfast.    . hydrochlorothiazide (MICROZIDE) 12.5 MG capsule Take 1 capsule (12.5 mg total) by mouth daily. 90 capsule 3  . HYDROcodone-acetaminophen (NORCO) 7.5-325 MG per tablet Take 1 tablet by mouth daily.    . insulin NPH-regular (NOVOLIN 70/30) (70-30) 100 UNIT/ML injection Inject into the skin 2 (two) times daily with a meal. 25 units in the AM and 5 units in the PM.    . simvastatin (ZOCOR) 20 MG tablet Take 20 mg by mouth daily.     No current facility-administered medications for this visit.    No Known Allergies    Review of Systems:   General:  normal appetite, normal energy, no weight gain, no weight loss, no fever  Cardiac:  no chest pain with exertion, no chest pain at rest, + possible SOB with exertion, no resting SOB, no PND, no orthopnea, no palpitations, no arrhythmia, no atrial fibrillation, no LE edema, + dizzy spells, no syncope  Respiratory:  no shortness of breath, no home oxygen, no productive cough, intermittent dry cough, no bronchitis, no wheezing, no hemoptysis, no asthma, no pain with inspiration or cough, no sleep apnea, no CPAP at night  GI:   no difficulty swallowing, no reflux, no frequent heartburn, no hiatal hernia, no abdominal pain, no constipation, no diarrhea, no hematochezia, no hematemesis, no melena  GU:   no dysuria,  no frequency, no urinary tract infection, no hematuria, no enlarged prostate, no kidney stones, no kidney disease  Vascular:  no pain suggestive of claudication, no pain in feet, + leg cramps, +  varicose veins, no DVT, no non-healing foot ulcer  Neuro:   no stroke, no TIA's, no seizures, occasional headaches, no temporary blindness one eye,  no slurred speech, no peripheral neuropathy, + chronic pain, no instability of gait, + memory/cognitive dysfunction  Musculoskeletal: + arthritis primarily in neck, no joint swelling, no myalgias, no difficulty walking, mobility somewhat "slow"  Skin:   No rash, no itching, no skin infections, no pressure sores or ulcerations  Psych:   + anxiety, + depression, + nervousness, + unusual recent stress  Eyes:   no blurry vision, no floaters, no recent vision changes, + wears glasses or contacts  ENT:   + hearing loss, no loose or painful teeth, no dentures, last saw dentist within 2 years  Hematologic:  no easy bruising, no abnormal bleeding, no clotting disorder, no frequent epistaxis  Endocrine:  + diabetes, does occasionally check CBG's at home           Physical Exam:   BP 151/59 mmHg  Pulse 57  Resp 16  Ht 5\' 10"  (1.778 m)  Wt 140 lb (63.504 kg)  BMI 20.09 kg/m2  SpO2 97%  General:  Elderly, thin and frail-appearing  HEENT:  Unremarkable   Neck:   no JVD, no bruits, no adenopathy   Chest:   clear to auscultation, symmetrical breath sounds, no wheezes, no rhonchi   CV:   RRR, grade IV/VI crescendo/decrescendo murmur heard best at RUSB,  no diastolic murmur  Abdomen:  soft, non-tender, no masses   Extremities:  warm, well-perfused, pulses palpable, no LE edema, + bilateral LE varicose veins  Rectal/GU  Deferred  Neuro:   Grossly non-focal and symmetrical throughout  Skin:   Clean and dry, no rashes, no breakdown   Diagnostic Tests:  Transthoracic Echocardiography  Patient:  Juan Pham, Juan Pham MR #:    [id number] Study Date: 11/19/2014 Gender:   M Age:    42 Height:   157.5 cm Weight:   66.7 kg BSA:    1.73 m^2 Pt. Status: Room:  ATTENDING  Nicki Guadalajara, M.D. ORDERING   Nicki Guadalajara, M.D. REFERRING   Nicki Guadalajara, M.D. PERFORMING  [facility], Outpatient SONOGRAPHER [facility], [facility]  cc:  ------------------------------------------------------------------- LV EF: 60% -  65%  ------------------------------------------------------------------- Indications:   Aortic Stenosis (I35.0).  ------------------------------------------------------------------- History:  Risk factors: Bradycardia  Family history of coronary artery disease. Former tobacco use. Hypertension. Diabetes mellitus. Dyslipidemia.  ------------------------------------------------------------------- Study Conclusions  - Left ventricle: The cavity size was normal. Systolic function was normal. The estimated ejection fraction was in the range of 60% to 65%.  Wall motion was normal; there were no regional wall motion abnormalities. Features are consistent with a pseudonormal left ventricular filling pattern, with concomitant abnormal relaxation and increased filling pressure (grade 2 diastolic dysfunction). Doppler parameters are consistent with high ventricular filling pressure. - Aortic valve: Severe diffuse thickening and calcification. Valve mobility was restricted. There was severe stenosis. There was mild to moderate regurgitation. Valve area (VTI): 1.26 cm^2. Valve area (Vmax): 1.07 cm^2. Valve area (Vmean): 1.14 cm^2. - Mitral valve: Calcified annulus. Moderate diffuse thickening and calcification. The findings are consistent with mild to moderate stenosis. There was trivial regurgitation. Valve area by pressure half-time: 1.54 cm^2. Valve area by continuity equation (using LVOT flow): 1.92 cm^2. - Left atrium: The atrium was severely dilated.  Transthoracic echocardiography. M-mode, complete 2D, spectral Doppler, and color Doppler. Birthdate: Patient birthdate: 01/25/1931. Age: Patient is 81 yr old. Sex: Gender: male. BMI: 26.9 kg/m^2. Blood pressure:    156/50 Patient status: Outpatient. Study date: Study date: 11/19/2014. Study time: 02:03 PM. Location: Echo laboratory.  -------------------------------------------------------------------  ------------------------------------------------------------------- Left ventricle: The cavity size was normal. Systolic function was normal. The estimated ejection fraction was in the range of 60% to 65%. Wall motion was normal; there were no regional wall motion abnormalities. Features are consistent with a pseudonormal left ventricular filling pattern, with concomitant abnormal relaxation and increased filling pressure (grade 2 diastolic dysfunction). Doppler parameters are consistent with high ventricular filling pressure.  ------------------------------------------------------------------- Aortic valve:  Trileaflet. Severe diffuse thickening and calcification. Valve mobility was restricted. Doppler:  There was severe stenosis.  There was mild to moderate regurgitation.  VTI ratio of LVOT to aortic valve: 0.3. Valve area (VTI): 1.26 cm^2. Indexed valve area (VTI): 0.73 cm^2/m^2. Peak velocity ratio of LVOT to aortic valve: 0.26. Valve area (Vmax): 1.07 cm^2. Indexed valve area (Vmax): 0.62 cm^2/m^2. Mean velocity ratio of LVOT to aortic valve: 0.27. Valve area (Vmean): 1.14 cm^2. Indexed valve area (Vmean): 0.66 cm^2/m^2.  Mean gradient (S): 40 mm Hg. Peak gradient (S): 81 mm Hg.  ------------------------------------------------------------------- Aorta: Aortic root: The aortic root was normal in size.  ------------------------------------------------------------------- Mitral valve:  Calcified annulus. Moderate diffuse thickening and calcification. Mobility was not restricted. Doppler:  The findings are consistent with mild to moderate stenosis.  There was trivial regurgitation.  Valve area by pressure half-time: 1.54 cm^2. Indexed valve area by pressure half-time:  0.89 cm^2/m^2. Valve area by continuity equation (using LVOT flow): 1.92 cm^2. Indexed valve area by continuity equation (using LVOT flow): 1.11 cm^2/m^2.  Mean gradient (D): 3 mm Hg. Peak gradient (D): 9 mm Hg.  ------------------------------------------------------------------- Left atrium: The atrium was severely dilated.  ------------------------------------------------------------------- Right ventricle: The cavity size was normal. Wall thickness was normal. Systolic function was normal.  ------------------------------------------------------------------- Pulmonic valve:  Structurally normal valve.  Cusp separation was normal. Doppler: Transvalvular velocity was within the normal range. There was no evidence for stenosis. There was no regurgitation.  ------------------------------------------------------------------- Tricuspid valve:  Structurally normal valve.  Doppler: Transvalvular velocity was within the normal range. There was trivial regurgitation.  ------------------------------------------------------------------- Pulmonary artery:  The main pulmonary artery was normal-sized. Systolic pressure was within the normal range.  ------------------------------------------------------------------- Right atrium: The atrium was normal in size.  ------------------------------------------------------------------- Pericardium: There was no pericardial effusion.  ------------------------------------------------------------------- Systemic veins: Inferior vena cava: The vessel was dilated. The respirophasic diameter changes were blunted (< 50%), consistent with elevated central venous pressure. Diameter: 21.2 mm.  ------------------------------------------------------------------- Measurements  IVC  Value     Reference ID                    21.2 mm    ---------  Left ventricle               Value     Reference LV ID, ED, PLAX chordal      (L)   34.1 mm    43 - 52 LV ID, ES, PLAX chordal          23  mm    23 - 38 LV fx shortening, PLAX chordal      33  %    >=29 LV PW thickness, ED            12.2 mm    --------- IVS/LV PW ratio, ED            0.82      <=1.3 Stroke volume, 2D             155  ml    --------- Stroke volume/bsa, 2D           90  ml/m^2  --------- LV ejection fraction, 1-p A4C       45  %    --------- LV end-diastolic volume, 2-p       88  ml    --------- LV end-systolic volume, 2-p        45  ml    --------- LV ejection fraction, 2-p         49  %    --------- Stroke volume, 2-p            43  ml    --------- LV end-diastolic volume/bsa, 2-p     51  ml/m^2  --------- LV end-systolic volume/bsa, 2-p      26  ml/m^2  --------- Stroke volume/bsa, 2-p          24.9 ml/m^2  --------- LV e&', lateral              7.57 cm/s   --------- LV E/e&', lateral             20.21     --------- LV e&', medial               5.37 cm/s   --------- LV E/e&', medial              28.49     --------- LV e&', average              6.47 cm/s   --------- LV E/e&', average             23.65     ---------  Ventricular septum            Value     Reference IVS thickness, ED             10  mm    ---------  LVOT                   Value     Reference LVOT ID, S                23  mm    --------- LVOT area                 4.15 cm^2   --------- LVOT peak velocity, S           116  cm/s   --------- LVOT  mean velocity, S            79.3 cm/s   --------- LVOT VTI, S                37.3 cm    --------- LVOT peak gradient, S           5   mm Hg  ---------  Aortic valve               Value     Reference Aortic valve peak velocity, S       451  cm/s   --------- Aortic valve mean velocity, S       289  cm/s   --------- Aortic valve VTI, S            123  cm    --------- Aortic mean gradient, S          40  mm Hg  --------- Aortic peak gradient, S          81  mm Hg  --------- VTI ratio, LVOT/AV            0.3      --------- Aortic valve area, VTI          1.26 cm^2   --------- Aortic valve area/bsa, VTI        0.73 cm^2/m^2 --------- Velocity ratio, peak, LVOT/AV       0.26      --------- Aortic valve area, peak velocity     1.07 cm^2   --------- Aortic valve area/bsa, peak        0.62 cm^2/m^2 --------- velocity Velocity ratio, mean, LVOT/AV       0.27      --------- Aortic valve area, mean velocity     1.14 cm^2   --------- Aortic valve area/bsa, mean        0.66 cm^2/m^2 --------- velocity Aortic regurg pressure half-time     478  ms    ---------  Aorta                   Value     Reference Aortic root ID, ED            33  mm    ---------  Left atrium                Value     Reference LA ID, A-P, ES              39  mm    --------- LA ID/bsa, A-P          (H)   2.26 cm/m^2  <=2.2 LA volume, S               81  ml    --------- LA volume/bsa, S             46.9 ml/m^2  --------- LA volume, ES, 1-p A4C          86  ml    --------- LA volume/bsa, ES, 1-p A4C        49.8 ml/m^2  --------- LA volume, ES, 1-p A2C           76  ml    --------- LA volume/bsa, ES, 1-p A2C        44  ml/m^2  ---------  Mitral valve               Value     Reference Mitral E-wave  peak velocity        153  cm/s   --------- Mitral A-wave peak velocity        162  cm/s   --------- Mitral mean velocity, D          74.9 cm/s   --------- Mitral deceleration time     (H)   623  ms    150 - 230 Mitral pressure half-time         143  ms    --------- Mitral mean gradient, D          3   mm Hg  --------- Mitral peak gradient, D          9   mm Hg  --------- Mitral E/A ratio, peak          0.94      --------- Mitral valve area, PHT, DP        1.54 cm^2   --------- Mitral valve area/bsa, PHT, DP      0.89 cm^2/m^2 --------- Mitral valve area, LVOT          1.92 cm^2   --------- continuity Mitral valve area/bsa, LVOT        1.11 cm^2/m^2 --------- continuity Mitral annulus VTI, D           80.8 cm    ---------  Pulmonary arteries            Value     Reference PA pressure, S, DP            22  mm Hg  <=30  Tricuspid valve              Value     Reference Tricuspid regurg peak velocity      185  cm/s   --------- Tricuspid peak RV-RA gradient       14  mm Hg  ---------  Right ventricle              Value     Reference RV s&', lateral, S             15.2 cm/s   ---------  Legend: (L) and (H) mark values outside specified reference range.  ------------------------------------------------------------------- Prepared and Electronically Authenticated by  Armanda Magic, MD 2016-08-12T17:14:10    CARDIAC CATHETERIZATION  Procedures    Right/Left Heart Cath and Coronary Angiography    PACS Images    Show images  for Cardiac catheterization     Link to Procedure Log    Procedure Log      Indications    Severe aortic valve stenosis [I35.0 (ICD-10-CM)]    Technique and Indications    Mr. Luce is an 81 year old white male who has a history of hypertension, hyperlipidemia, type 2 diabetes mellitus, and previously documented moderate aortic valve stenosis. An echo Doppler study in January 2014 revealed a mean transvalvular aortic gradient of 30 mm with a peak gradient of 47 mm and a calculated aortic valve area 1.1 cm. His echo from August 2015 revealed mild to moderate aortic stenosis with a mean gradient 36 and peak gradient of 61 mm. His most recent echo last month now shows progression to severe aortic stenosis with a gradient of 81 and mean gradient of 40. There is mild-to-moderate aortic insufficiency. He continues to have normal systolic function with grade 2 diastolic dysfunction. He has noticed increasing shortness of breath and vague chest discomfort. He was referred for right and left heart cardiac catheterization.  The patient was brought to the cath lab in the fasting state. Versed 1 mg was administered for conscious sedation. The right groin was prepped and draped in sterile fashion and a 6 French arterial sheath and 7 French venous sheath were inserted without difficulty. A Swan-Ganz catheter was advanced into the venous sheath and pressures were obtained in the right atrium, right ventricle, pulmonary artery, and pulmonary capillary wedge position. Cardiac outputs were obtained by the thermodilution and assumed Fick methods. Oxygen saturation was obtained in the pulmonary artery and aorta. A pigtail catheter was inserted and simultaneous AO/PA pressures were recorded. Using a long straight wire, the pigtail catheter was able to cross the stenotic aortic valve with restricted mobility and was advanced into the left ventricle. Simultaneous left ventricular and PCW pressures were recorded.  Simultaneous left ventricular and femoral artery pressures were recorded. Left ventriculography was performed in the RAO projection. A left ventricle to aorta pullback was performed. The pigtail catheter was then removed and diagnostic catheterization to delineate the coronary anatomy was performed utilizing 5 French Judkins 4 left and right diagnostic catheters. All catheters were removed and the patient. Hemostasis was obtained by direct manual pressure. The patient tolerated the procedure well and returned to his room in satisfactory condition.   Estimated blood loss <50 mL. There were no immediate complications during the procedure.    Conclusion     Mid Cx lesion, 50% stenosed.  1st Mrg lesion, 50% stenosed.  Ost RCA lesion, 40% stenosed.  The left ventricular systolic function is normal.  Normal left ventricular systolic function with an ejection fraction of approximately 55% without focal segmental wall motion abnormalities.  Severely calcified trileaflet aortic valve with reduced mobility and evidence for severe aortic valve stenosis with a peak peak gradient of 44.0 mm and mean gradient of 39.4 mm, and a valve area of 0.85 cm consistent with severe aortic valve stenosis. Trivial aortic insufficiency.  Significant mitral annular calcification with very mild mitral stenosis with a mean gradient of 4.8 mm in valve area of 1.8 cm.  Mild coronary calcification without significant stenosis in the LAD system, almond left circumflex artery with 50% narrowing in the OM1 vessel, and 50% circumflex stenosis between the proximal OM1 and proximal arm to vessel.  Nondominant small RCA with 40% very proximal stenosis.  RECOMMENDATION:  Surgical consultation will be obtained electively for consideration of TAVR.     Coronary Findings    Dominance: Left   Left Circumflex   . Mid Cx lesion, 50% stenosed.   . First Obtuse Marginal Branch   . 1st Mrg lesion, 50% stenosed.      Right Coronary Artery   . Ost RCA lesion, 40% stenosed.   . Acute Marginal Branch   The vessel is small in size.       Right Heart Pressures Hemodynamic findings consistent with mild pulmonary hypertension and mitral valve stenosis. LV EDP is normal.    Wall Motion                 Left Heart    Left Ventricle The left ventricular size is normal. The left ventricular systolic function is normal. The left ventricular ejection fraction is 55-65% by visual estimate. There are no wall motion abnormalities in the left ventricle.   Mitral Valve There is mild mitral valve stenosis and no mitral valve regurgitation. The annulus is calcified.   Aortic Valve There is severe aortic valve stenosis, and trivial (1+) aortic regurgitation. The aortic valve  is calcified. There is restricted aortic valve motion. The aortic valve is not congenitally bicuspid.    Coronary Diagrams    Diagnostic Diagram            Implants    Name ID Temporary Type Supply   No information to display    Hemo Data       Most Recent Value   Fick Cardiac Output  5.68 L/min   Fick Cardiac Output Index  3.16 (L/min)/BSA   Thermal Cardiac Output  5.06 L/min   Thermal Cardiac Output Index  2.81 (L/min)/BSA   Aortic Mean Gradient  39.4 mmHg   Aortic Peak Gradient  44 mmHg   Aortic Valve Area  0.85   Aortic Value Area Index  0.47 cm2/BSA   Mitral Mean Gradient  5.8 mmHg   Mitral Peak Gradient  0 mmHg   Mitral Valve Area Index  1.01 cm2/BSA   RA A Wave  8 mmHg   RA V Wave  5 mmHg   RA Mean  0 mmHg   RV Systolic Pressure  36 mmHg   RV Diastolic Pressure  -3 mmHg   RV EDP  5 mmHg   PA Systolic Pressure  36 mmHg   PA Diastolic Pressure  7 mmHg   PA Mean  18 mmHg   PW A Wave  11 mmHg   PW V Wave  9 mmHg   PW Mean  9 mmHg   AO Systolic Pressure  146 mmHg   AO Diastolic Pressure  43 mmHg   AO Mean  77 mmHg   LV Systolic Pressure  191 mmHg   LV Diastolic Pressure   0 mmHg   LV EDP  7 mmHg   Arterial Occlusion Pressure Extended Systolic Pressure  147 mmHg   Arterial Occlusion Pressure Extended Diastolic Pressure  42 mmHg   Arterial Occlusion Pressure Extended Mean Pressure  76 mmHg   Left Ventricular Apex Extended Systolic Pressure  191 mmHg   Left Ventricular Apex Extended Diastolic Pressure  15 mmHg   Left Ventricular Apex Extended EDP Pressure  47 mmHg   TPVR Index  5.69 HRUI   TSVR Index  29.52 HRUI   TPVR/TSVR Ratio  0.19       Impression:  Patient has stage D severe symptomatic aortic stenosis. Although the patient's current functional status and symptoms are somewhat difficult to characterize because of his inability to articulate a review of systems, it sounds as though he is having some symptoms of exertional shortness of breath and fatigue consistent with chronic diastolic congestive heart failure, [place] Heart Association functional class II. He also has reported intermittent dizzy spells without syncope. I have personally reviewed the patient's recent transthoracic echocardiogram and diagnostic catheterization. Echocardiogram demonstrates the presence of severe thickening with calcification and restricted leaflet mobility involving all 3 leaflets of the patient's aortic valve. Peak velocity across the aortic valve measures as high as 4.5 m/s corresponding to mean transvalvular gradient ranging between 40 and 50 mmHg. Left ventricular systolic function remains normal. Diagnostic cardiac catheterization is notable for the presence of moderate nonobstructive coronary artery disease and confirms the presence of severe aortic stenosis with mean transvalvular gradient measured 39.5 mmHg at catheterization.    However, at this point it seems so the patient's most pressing issue has been significant decline in his cognitive function over the past year.  He clearly has significant dementia, and his daughter admits that this has been gradually  progressing for quite some  time now and has recently become much more problematic from a practical standpoint.  She has attributed this to stress and anxiety.  Under the circumstances I would not consider this patient a candidate for conventional surgical aortic valve replacement under any circumstances, and it might be most appropriate to formally evaluate the patient's dementia before making a definitive decision as to whether his aortic stenosis should be treated using TAVR or managed with a palliative approach.    Plan:  The patient will undergo MRI of the brain and be referred for a formal neurology consultation to assess the severity of his underlying dementia. He will also undergo a formal physical therapy consultation to better characterize his underlying functional status. Once this has been completed the patient will be referred for consultation with interventional cardiology before making a decision as to whether or not to proceed with any further diagnostic testing related to TAVR.  The patient will be followed carefully through the multidisciplinary heart valve clinic. I have discussed matters at length with the patient, his daughter, and his granddaughter. All of their questions have been addressed.    Salvatore Decent. Cornelius Moras, MD 12/28/2014 4:03 PM

## 2014-12-29 ENCOUNTER — Other Ambulatory Visit: Payer: Self-pay | Admitting: Thoracic Surgery (Cardiothoracic Vascular Surgery)

## 2014-12-29 DIAGNOSIS — Z139 Encounter for screening, unspecified: Secondary | ICD-10-CM

## 2015-01-01 ENCOUNTER — Ambulatory Visit
Admission: RE | Admit: 2015-01-01 | Discharge: 2015-01-01 | Disposition: A | Discharge status: Home / Self Care | Payer: Medicare Other | Source: Ambulatory Visit | Attending: Thoracic Surgery (Cardiothoracic Vascular Surgery) | Admitting: Thoracic Surgery (Cardiothoracic Vascular Surgery)

## 2015-01-01 DIAGNOSIS — I639 Cerebral infarction, unspecified: Secondary | ICD-10-CM

## 2015-01-12 ENCOUNTER — Ambulatory Visit: Payer: Medicare Other | Admitting: Neurology

## 2015-01-21 ENCOUNTER — Encounter: Payer: Self-pay | Admitting: Neurology

## 2015-01-21 ENCOUNTER — Ambulatory Visit (INDEPENDENT_AMBULATORY_CARE_PROVIDER_SITE_OTHER): Payer: Medicare Other | Admitting: Neurology

## 2015-01-21 VITALS — BP 149/50 | HR 53 | Ht 63.0 in | Wt 144.2 lb

## 2015-01-21 DIAGNOSIS — R413 Other amnesia: Secondary | ICD-10-CM | POA: Diagnosis not present

## 2015-01-21 DIAGNOSIS — F329 Major depressive disorder, single episode, unspecified: Secondary | ICD-10-CM | POA: Diagnosis not present

## 2015-01-21 DIAGNOSIS — F32A Depression, unspecified: Secondary | ICD-10-CM

## 2015-01-21 MED ORDER — CITALOPRAM HYDROBROMIDE 10 MG PO TABS: 10.0000 mg | ORAL_TABLET | Freq: Every day | ORAL | Status: DC

## 2015-01-21 NOTE — Patient Instructions (Signed)
Overall you are doing fairly well but I do want to suggest a few things today:   Remember to drink plenty of fluid, eat healthy meals and do not skip any meals. Try to eat protein with a every meal and eat a healthy snack such as fruit or nuts in between meals. Try to keep a regular sleep-wake schedule and try to exercise daily, particularly in the form of walking, 20-30 minutes a day, if you can.   As far as your medications are concerned, I would like to suggest: Start Celexa 10mg  daily.   I would like to see you back in 6 months, sooner if we need to. Please call us with any interim questions, concerns, problems, updates or refill requests.   Please also call us for any test results so we can go over those with you on the phone.  My clinical assistant and will answer any of your questions and relay your messages to me and also relay most of my messages to you.   Our phone number is [phone]. We also have an after hours call service for urgent matters and there is a physician on-call for urgent questions. For any emergencies you know to call 911 or go to the nearest emergency room

## 2015-01-21 NOTE — Progress Notes (Addendum)
[facility]    Provider:  Dr Lucia Gaskins Referring Provider: Elfredia Nevins, MD Primary Care Physician:  Cassell Smiles., MD  CC:  Memory loss  HPI:  Juan Pham is a 81 y.o. male here as a referral from Dr. Sherwood Gambler for memory loss. He has a PMHx of HTN, DM, HLD, encephalopathy secondary to hyperglycemia and dehydration, CAD, afib, anxiety. Patient says he is here for his mind. He is here with his daughter. Her father has a heart murmur and the valves need to be replaced. Patient struggles with remembering things. They have decided not to go through with the surgery. He has had TIAs and he is on aspirin. He get B12 shots due to B12 deficiency. Memory problems started in 2009. It started with him forgetting little things like the name of a street. Then he would try to tell you something and not get the words out. It has slowly progressed and then when his grandson died on 01-Aug-2024patient has significantly declined since then. He lives on his own, he cooks his own meals, he drives, he has been lost once. He has fluctuating glucose levels and gets confused. Daughter feels there is depression since wife and grandchild died. Father refuses to talk to a therapist. He is not on any anti-depression medication. Daughter feels like her father is significantly depressed. Patient endorses sadness every day. No hallucinations or delusions. He is agitated and frustrated because he can't understand why he can't get his words together and why things have changed. Daughter helps with his medications. Daughetr helps with the bills. No accidents at home. He has fallen a few times. He forgets appointments, peoples names, more recent memories. He can't remember recent conversations, things that are said. He has a good grasp on remote memories. Daughter and father crying in the office.   Reviewed notes, labs and imaging from outside physicians, which showed:  B12 and TSH this year were within normal  limits.  MRI of the brain 12/2014. Personally reviewed images: 1. Small area of restricted diffusion in the right splenium of the corpus callosum favored to represent an acute lacunar infarct. No associated mass effect or hemorrhage. Main differential consideration in this setting is sequelae of metabolic disturbance (including hypoglycemia, hypo/hypernatremia). 2. Otherwise stable MRI appearance of the brain since June with mild to moderate for age chronic small vessel ischemia elsewhere.  Review of Systems: Patient complains of symptoms per HPI as well as the following symptoms: CP, anemia, easy bruising, easy bleeding, cough, feeling hot and cold, memory loss, confusion, headache, numbness, weakness, dizziness, insomnia, restless legs, joint pain, incontinence, diarrhea. Pertinent negatives per HPI. All others negative.   Social History   Social History  . Marital Status: Widowed    Spouse Name: N/A  . Number of Children: 3  . Years of Education: HS   Occupational History  . Retired Other   Social History Main Topics  . Smoking status: Former Smoker    Types: Pipe    Quit date: 10/21/1982  . Smokeless tobacco: Never Used  . Alcohol Use: No  . Drug Use: No  . Sexual Activity: Not on file   Other Topics Concern  . Not on file   Social History Narrative   Patient lives at home alone.   Caffeine Use: 2 cups daily    Family History  Problem Relation Age of Onset  . Heart disease Mother   . Dementia Neg Hx     Past Medical History  Diagnosis Date  . Hypertension   . Hyperlipidemia   . Bradycardia   . Diabetes mellitus without complication (HCC)     type II  . Aortic stenosis   . Diastolic dysfunction     grade 1  . Dementia   . Atrial fibrillation [facility])     Past Surgical History  Procedure Laterality Date  . Cateract    . Retna    . Cardiac catheterization N/A 12/22/2014    Procedure: Right/Left Heart Cath and Coronary Angiography;  Surgeon: Lennette Bihari,  MD;  Location: [facility] INVASIVE CV LAB;  Service: Cardiovascular;  Laterality: N/A;    Current Outpatient Prescriptions  Medication Sig Dispense Refill  . amLODipine (NORVASC) 10 MG tablet Take 10 mg by mouth daily.    Marland Kitchen aspirin 81 MG chewable tablet Chew by mouth daily.    . carvedilol (COREG) 12.5 MG tablet Take 0.5 tablets (6.25 mg total) by mouth 2 (two) times daily with a meal. 1/2 tablet 90 tablet 3  . donepezil (ARICEPT) 5 MG tablet Take 5 mg by mouth daily.    Lajoyce Corners TEST test strip Inject 8 strips as directed daily.    . fenofibrate micronized (LOFIBRA) 134 MG capsule TAKE 1 BY MOUTH DAILY 90 capsule 2  . glimepiride (AMARYL) 2 MG tablet Take 2 mg by mouth daily with breakfast.     . glipiZIDE (GLUCOTROL) 5 MG tablet Take 20 mg by mouth daily before breakfast.    . hydrochlorothiazide (MICROZIDE) 12.5 MG capsule Take 1 capsule (12.5 mg total) by mouth daily. 90 capsule 3  . HYDROcodone-acetaminophen (NORCO) 10-325 MG tablet Take 1 tablet by mouth every 6 (six) hours as needed.    Marland Kitchen HYDROcodone-acetaminophen (NORCO) 7.5-325 MG per tablet Take 1 tablet by mouth daily.    . insulin NPH-regular (NOVOLIN 70/30) (70-30) 100 UNIT/ML injection Inject into the skin 2 (two) times daily with a meal. 25 units in the AM and 5 units in the PM.    . memantine (NAMENDA) 5 MG tablet Take 1 tablet by mouth daily.    . simvastatin (ZOCOR) 20 MG tablet Take 20 mg by mouth daily.    . tamsulosin (FLOMAX) 0.4 MG CAPS capsule Take 1 capsule by mouth daily.    . citalopram (CELEXA) 10 MG tablet Take 1 tablet (10 mg total) by mouth daily. 30 tablet 6   No current facility-administered medications for this visit.    Allergies as of 01/21/2015  . (No Known Allergies)    Vitals: BP 149/50 mmHg  Pulse 53  Ht  (1.6 m)  Wt 144 lb 3.2 oz (65.409 kg)  BMI 25.55 kg/m2 Last Weight:  Wt Readings from Last 1 Encounters:  01/21/15 144 lb 3.2 oz (65.409 kg)   Last Height:   Ht Readings from Last 1  Encounters:  01/21/15  (1.6 m)   Physical exam: Exam: Gen: NAD, not conversant. Significant psychomotor slowing               CV: bradycardia, no MRG. No Carotid Bruits. No peripheral edema, warm, nontender Eyes: Conjunctivae clear without exudates or hemorrhage  Neuro: Detailed Neurologic Exam  Speech:    Speech is hypophonic Cognition: Montreal Cognitive Assessment  01/21/2015  Visuospatial/ Executive (0/5) 0  Naming (0/3) 1  Attention: Read list of digits (0/2) 0  Attention: Read list of letters (0/1) 0  Attention: Serial 7 subtraction starting at 100 (0/3) 0  Language: Repeat phrase (0/2) 0  Language :  Fluency (0/1) 0  Abstraction (0/2) 0  Delayed Recall (0/5) 0  Orientation (0/6) 3  Total 4    Cranial Nerves:    The pupils are equal, round, and reactive to light. Attempted fundoscopic exam, could not visualize. Visual fields are full to threat bilaterally. Extraocular movements are intact. Trigeminal sensation is intact and the muscles of mastication are normal. The face is symmetric. The palate elevates in the midline. Hearing intact. Voice is normal. Shoulder shrug is normal. The tongue has normal motion without fasciculations.   Coordination:    No dysmetria noted  Gait:: bradykinesia      Motor Observation:    No asymmetry, no atrophy, and no involuntary movements noted.    No resting tremor Tone:    Normal muscle tone.    Posture:    Posture is stooped    Strength:    Strength is V/V in the upper and lower limbs.      Sensation: intact to LT     Reflex Exam:  DTR's:    Deep tendon reflexes in the upper and lower extremities are brisk bilaterally.   Toes:    The toes are downgoing bilaterally.   Clonus:    Clonus is absent.       Assessment/Plan:  81 year old with baseline cognitive problems with a significant decline since the death of his wife and grandchild. Memory changes likely multifactorial including baseline memory loss, medical  problems, depression.   Continue aricept and namenda Appears he has baseline memory loss but cannot determine how much depression is contributing. Started on Celexa. Referred to Dr. Archer AsaGerald Plovsky who is a geriatric psychiatrist. Highly encouraged therapy and psychiatric follow up.  Patient needs in home physical therapy for gait and safety, home safety evaluation, help with medication management and skilled nursing for his multiple medical complaints and counseling for his dementia and medical problems. Will ask [facility].  Daughter doesn't know what dose of the aricept or namenda he is on, I recommend f/yu with physician who prescribed them to ensure appropriate doses.  Discussed side effects of Celexa which can include suicidality, depression exacerbation,mania,serotoninsyndrome, electrolyte imbalances, QT prolongation and other arrhythmias, stop for anything concerning at all and call the office. Common reactions being include nausea, dry mouth, somnolence, insomnia, tremor, sexual dysfunction and others.  AddendumFrances Pham: [facility] made multiple attempts to see patient. They alled all the daughters to meet them at the patient's home.  They went to hone one day, patient came to door disheveled. Patient was alone.  When they finally spoke to daughters, one said se has anxiety and cant leave the house to meet [facility] at patient's home, the other has a daycare and could not leave either, patient stays home alone and daughters said he does not have a daily caretaker. [facility] cannot establish care without a caretaker for patient in the home during their visit. [facility] also called other daughter Juan Quickam who is POA multiple times. Juan Pham eventually called back and discontinued [facility] because she stated patient did not feel comfortable with [facility].    Forwarded above addendum to pcp.  Juan DeanAntonia Tamkia Temples, MD  [facility] [address] [place], [place] [id number]  Phone [phone] Fax  [phone]

## 2015-01-23 ENCOUNTER — Encounter: Payer: Self-pay | Admitting: Neurology

## 2015-01-23 DIAGNOSIS — F32A Depression, unspecified: Secondary | ICD-10-CM | POA: Insufficient documentation

## 2015-01-23 DIAGNOSIS — F329 Major depressive disorder, single episode, unspecified: Secondary | ICD-10-CM | POA: Insufficient documentation

## 2015-01-24 ENCOUNTER — Other Ambulatory Visit: Payer: Self-pay | Admitting: *Deleted

## 2015-01-24 DIAGNOSIS — R413 Other amnesia: Secondary | ICD-10-CM

## 2015-01-25 ENCOUNTER — Telehealth: Payer: Self-pay | Admitting: *Deleted

## 2015-01-25 NOTE — Telephone Encounter (Signed)
[facility] for [facility]. Dareen Pianodwinna is out of the office currently. I spoke with Janelle FloorNaomi. She checked and pt is scheduled to start today with services. They did receive referral. Told her to call if they need anything else. She verbalized understanding.

## 2015-01-27 ENCOUNTER — Encounter: Payer: Self-pay | Admitting: *Deleted

## 2015-01-27 NOTE — Progress Notes (Signed)
[facility] stopped by office to inform [name] and I that daughter [facility](POA) and pt are refusing care. They will fax over findings of home evaluation.

## 2015-02-16 ENCOUNTER — Other Ambulatory Visit (HOSPITAL_COMMUNITY): Payer: Self-pay | Admitting: Internal Medicine

## 2015-02-16 DIAGNOSIS — I739 Peripheral vascular disease, unspecified: Secondary | ICD-10-CM

## 2015-02-17 ENCOUNTER — Observation Stay (HOSPITAL_COMMUNITY)
Admission: EM | Admit: 2015-02-17 | Discharge: 2015-02-18 | Disposition: A | Discharge status: Home / Self Care | Payer: Medicare Other | Attending: Internal Medicine | Admitting: Internal Medicine

## 2015-02-17 ENCOUNTER — Encounter (HOSPITAL_COMMUNITY): Payer: Self-pay | Admitting: Emergency Medicine

## 2015-02-17 ENCOUNTER — Emergency Department (HOSPITAL_COMMUNITY): Payer: Medicare Other

## 2015-02-17 DIAGNOSIS — R001 Bradycardia, unspecified: Secondary | ICD-10-CM | POA: Diagnosis present

## 2015-02-17 DIAGNOSIS — J159 Unspecified bacterial pneumonia: Secondary | ICD-10-CM | POA: Diagnosis not present

## 2015-02-17 DIAGNOSIS — I8390 Asymptomatic varicose veins of unspecified lower extremity: Secondary | ICD-10-CM

## 2015-02-17 DIAGNOSIS — E162 Hypoglycemia, unspecified: Secondary | ICD-10-CM

## 2015-02-17 DIAGNOSIS — Z7982 Long term (current) use of aspirin: Secondary | ICD-10-CM | POA: Insufficient documentation

## 2015-02-17 DIAGNOSIS — E785 Hyperlipidemia, unspecified: Secondary | ICD-10-CM | POA: Diagnosis not present

## 2015-02-17 DIAGNOSIS — Y998 Other external cause status: Secondary | ICD-10-CM | POA: Diagnosis not present

## 2015-02-17 DIAGNOSIS — Z794 Long term (current) use of insulin: Secondary | ICD-10-CM | POA: Insufficient documentation

## 2015-02-17 DIAGNOSIS — E11649 Type 2 diabetes mellitus with hypoglycemia without coma: Secondary | ICD-10-CM | POA: Diagnosis not present

## 2015-02-17 DIAGNOSIS — X58XXXA Exposure to other specified factors, initial encounter: Secondary | ICD-10-CM | POA: Diagnosis not present

## 2015-02-17 DIAGNOSIS — Z87891 Personal history of nicotine dependence: Secondary | ICD-10-CM | POA: Diagnosis not present

## 2015-02-17 DIAGNOSIS — I4891 Unspecified atrial fibrillation: Secondary | ICD-10-CM | POA: Diagnosis not present

## 2015-02-17 DIAGNOSIS — F0390 Unspecified dementia without behavioral disturbance: Secondary | ICD-10-CM | POA: Insufficient documentation

## 2015-02-17 DIAGNOSIS — F329 Major depressive disorder, single episode, unspecified: Secondary | ICD-10-CM

## 2015-02-17 DIAGNOSIS — T68XXXA Hypothermia, initial encounter: Secondary | ICD-10-CM

## 2015-02-17 DIAGNOSIS — I350 Nonrheumatic aortic (valve) stenosis: Secondary | ICD-10-CM | POA: Insufficient documentation

## 2015-02-17 DIAGNOSIS — T383X1A Poisoning by insulin and oral hypoglycemic [antidiabetic] drugs, accidental (unintentional), initial encounter: Principal | ICD-10-CM | POA: Insufficient documentation

## 2015-02-17 DIAGNOSIS — I10 Essential (primary) hypertension: Secondary | ICD-10-CM | POA: Diagnosis not present

## 2015-02-17 DIAGNOSIS — Z79899 Other long term (current) drug therapy: Secondary | ICD-10-CM | POA: Insufficient documentation

## 2015-02-17 DIAGNOSIS — J189 Pneumonia, unspecified organism: Secondary | ICD-10-CM

## 2015-02-17 DIAGNOSIS — G9340 Encephalopathy, unspecified: Secondary | ICD-10-CM

## 2015-02-17 DIAGNOSIS — F32A Depression, unspecified: Secondary | ICD-10-CM

## 2015-02-17 DIAGNOSIS — Y9389 Activity, other specified: Secondary | ICD-10-CM | POA: Insufficient documentation

## 2015-02-17 DIAGNOSIS — Y9289 Other specified places as the place of occurrence of the external cause: Secondary | ICD-10-CM | POA: Insufficient documentation

## 2015-02-17 DIAGNOSIS — E119 Type 2 diabetes mellitus without complications: Secondary | ICD-10-CM

## 2015-02-17 DIAGNOSIS — E160 Drug-induced hypoglycemia without coma: Secondary | ICD-10-CM

## 2015-02-17 HISTORY — DX: Drug-induced hypoglycemia without coma: E16.0

## 2015-02-17 HISTORY — DX: Poisoning by insulin and oral hypoglycemic (antidiabetic) drugs, accidental (unintentional), initial encounter: T38.3X1A

## 2015-02-17 LAB — CBC WITH DIFFERENTIAL/PLATELET
BASOS ABS: 0.1 10*3/uL (ref 0.0–0.1)
BASOS PCT: 1 %
Eosinophils Absolute: 0.2 10*3/uL (ref 0.0–0.7)
Eosinophils Relative: 2 %
HCT: 37.3 % — ABNORMAL LOW (ref 39.0–52.0)
Hemoglobin: 12.6 g/dL — ABNORMAL LOW (ref 13.0–17.0)
Lymphocytes Relative: 14 %
Lymphs Abs: 1.2 10*3/uL (ref 0.7–4.0)
MCH: 30.6 pg (ref 26.0–34.0)
MCHC: 33.8 g/dL (ref 30.0–36.0)
MCV: 90.5 fL (ref 78.0–100.0)
MONOS PCT: 8 %
Monocytes Absolute: 0.7 10*3/uL (ref 0.1–1.0)
NEUTROS ABS: 6.3 10*3/uL (ref 1.7–7.7)
NEUTROS PCT: 75 %
PLATELETS: 296 10*3/uL (ref 150–400)
RBC: 4.12 MIL/uL — ABNORMAL LOW (ref 4.22–5.81)
RDW: 13.3 % (ref 11.5–15.5)
WBC: 8.5 10*3/uL (ref 4.0–10.5)

## 2015-02-17 LAB — URINE MICROSCOPIC-ADD ON

## 2015-02-17 LAB — GLUCOSE, CAPILLARY
GLUCOSE-CAPILLARY: 212 mg/dL — AB (ref 65–99)
GLUCOSE-CAPILLARY: 255 mg/dL — AB (ref 65–99)
GLUCOSE-CAPILLARY: 265 mg/dL — AB (ref 65–99)
Glucose-Capillary: 203 mg/dL — ABNORMAL HIGH (ref 65–99)
Glucose-Capillary: 273 mg/dL — ABNORMAL HIGH (ref 65–99)
Glucose-Capillary: 66 mg/dL (ref 65–99)
Glucose-Capillary: 253 mg/dL — ABNORMAL HIGH (ref 65–99)
Glucose-Capillary: 234 mg/dL — ABNORMAL HIGH (ref 65–99)

## 2015-02-17 LAB — URINALYSIS, ROUTINE W REFLEX MICROSCOPIC
Bilirubin Urine: NEGATIVE
GLUCOSE, UA: 100 mg/dL — AB
KETONES UR: NEGATIVE mg/dL
Leukocytes, UA: NEGATIVE
Nitrite: NEGATIVE
Specific Gravity, Urine: 1.02 (ref 1.005–1.030)
Urobilinogen, UA: 0.2 mg/dL (ref 0.0–1.0)
pH: 7 (ref 5.0–8.0)

## 2015-02-17 LAB — BASIC METABOLIC PANEL
ANION GAP: 6 (ref 5–15)
BUN: 33 mg/dL — ABNORMAL HIGH (ref 6–20)
CALCIUM: 9.3 mg/dL (ref 8.9–10.3)
CO2: 29 mmol/L (ref 22–32)
CREATININE: 1.16 mg/dL (ref 0.61–1.24)
Chloride: 104 mmol/L (ref 101–111)
GFR, EST NON AFRICAN AMERICAN: 57 mL/min — AB (ref >60)
Glucose, Bld: 65 mg/dL (ref 65–99)
Potassium: 3.6 mmol/L (ref 3.5–5.1)
SODIUM: 139 mmol/L (ref 135–145)

## 2015-02-17 LAB — TSH: TSH: 0.844 u[IU]/mL (ref 0.350–4.500)

## 2015-02-17 LAB — CBG MONITORING, ED
GLUCOSE-CAPILLARY: 177 mg/dL — AB (ref 65–99)
Glucose-Capillary: 163 mg/dL — ABNORMAL HIGH (ref 65–99)
Glucose-Capillary: 62 mg/dL — ABNORMAL LOW (ref 65–99)
Glucose-Capillary: 98 mg/dL (ref 65–99)
Glucose-Capillary: 131 mg/dL — ABNORMAL HIGH (ref 65–99)
Glucose-Capillary: 170 mg/dL — ABNORMAL HIGH (ref 65–99)
Glucose-Capillary: 128 mg/dL — ABNORMAL HIGH (ref 65–99)

## 2015-02-17 LAB — I-STAT CG4 LACTIC ACID, ED: LACTIC ACID, VENOUS: 1.54 mmol/L (ref 0.5–2.0)

## 2015-02-17 LAB — T4, FREE: FREE T4: 1.09 ng/dL (ref 0.61–1.12)

## 2015-02-17 MED ORDER — INSULIN ASPART 100 UNIT/ML ~~LOC~~ SOLN
0.0000 [IU] | Freq: Three times a day (TID) | SUBCUTANEOUS | Status: DC
  Administered 2015-02-17: 8 [IU] via SUBCUTANEOUS

## 2015-02-17 MED ORDER — SIMVASTATIN 20 MG PO TABS
20.0000 mg | ORAL_TABLET | Freq: Every day | ORAL | Status: DC
  Administered 2015-02-17 – 2015-02-18 (×2): 20 mg via ORAL
  Filled 2015-02-17 (×2): qty 1

## 2015-02-17 MED ORDER — AMLODIPINE BESYLATE 5 MG PO TABS
10.0000 mg | ORAL_TABLET | Freq: Every day | ORAL | Status: DC
  Administered 2015-02-17 – 2015-02-18 (×2): 10 mg via ORAL
  Filled 2015-02-17 (×2): qty 2

## 2015-02-17 MED ORDER — CEFTRIAXONE SODIUM 1 G IJ SOLR
1.0000 g | Freq: Once | INTRAMUSCULAR | Status: DC
  Filled 2015-02-17: qty 10

## 2015-02-17 MED ORDER — TAMSULOSIN HCL 0.4 MG PO CAPS
0.4000 mg | ORAL_CAPSULE | Freq: Every day | ORAL | Status: DC
  Administered 2015-02-17 – 2015-02-18 (×2): 0.4 mg via ORAL
  Filled 2015-02-17 (×2): qty 1

## 2015-02-17 MED ORDER — DEXTROSE 5 % IV SOLN
Freq: Once | INTRAVENOUS | Status: AC
  Administered 2015-02-17: 03:00:00 via INTRAVENOUS

## 2015-02-17 MED ORDER — INSULIN ASPART 100 UNIT/ML ~~LOC~~ SOLN
0.0000 [IU] | SUBCUTANEOUS | Status: DC
  Administered 2015-02-17: 3 [IU] via SUBCUTANEOUS
  Administered 2015-02-18: 2 [IU] via SUBCUTANEOUS

## 2015-02-17 MED ORDER — HEPARIN SODIUM (PORCINE) 5000 UNIT/ML IJ SOLN
5000.0000 [IU] | Freq: Three times a day (TID) | INTRAMUSCULAR | Status: DC
  Administered 2015-02-17 – 2015-02-18 (×4): 5000 [IU] via SUBCUTANEOUS
  Filled 2015-02-17 (×4): qty 1

## 2015-02-17 MED ORDER — ZOLPIDEM TARTRATE 5 MG PO TABS
5.0000 mg | ORAL_TABLET | Freq: Once | ORAL | Status: AC
  Administered 2015-02-17: 5 mg via ORAL
  Filled 2015-02-17: qty 1

## 2015-02-17 MED ORDER — ENSURE ENLIVE PO LIQD
237.0000 mL | Freq: Two times a day (BID) | ORAL | Status: DC
  Administered 2015-02-17 (×2): 237 mL via ORAL

## 2015-02-17 MED ORDER — INSULIN GLARGINE 100 UNIT/ML ~~LOC~~ SOLN
10.0000 [IU] | Freq: Two times a day (BID) | SUBCUTANEOUS | Status: DC
  Administered 2015-02-17: 10 [IU] via SUBCUTANEOUS
  Filled 2015-02-17 (×3): qty 0.1

## 2015-02-17 MED ORDER — DEXTROSE 5 % IV SOLN
INTRAVENOUS | Status: AC
  Filled 2015-02-17: qty 500

## 2015-02-17 MED ORDER — ACETAMINOPHEN 325 MG PO TABS
650.0000 mg | ORAL_TABLET | Freq: Four times a day (QID) | ORAL | Status: DC | PRN
  Administered 2015-02-17: 650 mg via ORAL
  Filled 2015-02-17: qty 2

## 2015-02-17 MED ORDER — INSULIN ASPART 100 UNIT/ML ~~LOC~~ SOLN: 0.0000 [IU] | Freq: Every day | SUBCUTANEOUS | Status: DC

## 2015-02-17 MED ORDER — FENOFIBRATE 160 MG PO TABS
160.0000 mg | ORAL_TABLET | Freq: Every day | ORAL | Status: DC
Start: 2015-02-17 — End: 2015-02-18
  Administered 2015-02-17 – 2015-02-18 (×2): 160 mg via ORAL
  Filled 2015-02-17 (×2): qty 1

## 2015-02-17 MED ORDER — ASPIRIN 81 MG PO CHEW
81.0000 mg | CHEWABLE_TABLET | Freq: Every day | ORAL | Status: DC
  Administered 2015-02-17 – 2015-02-18 (×2): 81 mg via ORAL
  Filled 2015-02-17 (×2): qty 1

## 2015-02-17 MED ORDER — DEXTROSE-NACL 5-0.9 % IV SOLN
INTRAVENOUS | Status: DC
  Administered 2015-02-17: 03:00:00 via INTRAVENOUS
  Administered 2015-02-18: 1000 mL via INTRAVENOUS

## 2015-02-17 MED ORDER — SODIUM CHLORIDE 0.9 % IV SOLN: INTRAVENOUS | Status: DC

## 2015-02-17 MED ORDER — CITALOPRAM HYDROBROMIDE 20 MG PO TABS
10.0000 mg | ORAL_TABLET | Freq: Every day | ORAL | Status: DC
  Administered 2015-02-17 – 2015-02-18 (×2): 10 mg via ORAL
  Filled 2015-02-17 (×2): qty 1

## 2015-02-17 MED ORDER — CARVEDILOL 3.125 MG PO TABS
6.2500 mg | ORAL_TABLET | Freq: Two times a day (BID) | ORAL | Status: DC
  Administered 2015-02-17 – 2015-02-18 (×3): 6.25 mg via ORAL
  Filled 2015-02-17 (×3): qty 2

## 2015-02-17 MED ORDER — DONEPEZIL HCL 5 MG PO TABS
5.0000 mg | ORAL_TABLET | Freq: Every day | ORAL | Status: DC
  Administered 2015-02-17 – 2015-02-18 (×2): 5 mg via ORAL
  Filled 2015-02-17 (×2): qty 1

## 2015-02-17 MED ORDER — DEXTROSE 50 % IV SOLN
INTRAVENOUS | Status: AC
  Filled 2015-02-17: qty 50

## 2015-02-17 MED ORDER — DEXTROSE 50 % IV SOLN
1.0000 | Freq: Once | INTRAVENOUS | Status: AC
  Administered 2015-02-17: 50 mL via INTRAVENOUS

## 2015-02-17 MED ORDER — DEXTROSE 5 % IV SOLN
500.0000 mg | Freq: Once | INTRAVENOUS | Status: AC
  Administered 2015-02-17: 500 mg via INTRAVENOUS
  Filled 2015-02-17: qty 500

## 2015-02-17 MED ORDER — DEXTROSE 50 % IV SOLN
INTRAVENOUS | Status: AC
  Administered 2015-02-17: 25 mL
  Filled 2015-02-17: qty 50

## 2015-02-17 MED ORDER — HYDROCHLOROTHIAZIDE 12.5 MG PO CAPS
12.5000 mg | ORAL_CAPSULE | Freq: Every day | ORAL | Status: DC
  Administered 2015-02-18: 12.5 mg via ORAL
  Filled 2015-02-17 (×2): qty 1

## 2015-02-17 MED ORDER — DEXTROSE 50 % IV SOLN
25.0000 mL | Freq: Once | INTRAVENOUS | Status: AC
  Administered 2015-02-17: 25 mL via INTRAVENOUS

## 2015-02-17 MED ORDER — MEMANTINE HCL 10 MG PO TABS
5.0000 mg | ORAL_TABLET | Freq: Every day | ORAL | Status: DC
  Administered 2015-02-17 – 2015-02-18 (×2): 5 mg via ORAL
  Filled 2015-02-17 (×2): qty 1

## 2015-02-17 NOTE — H&P (Signed)
[facility] History and Physical  ESVIN HNAT ZOX:[id number] DOB: 24-Dec-1930    PCP:   Cassell Smiles., MD   Chief Complaint: hypoglycemia.   HPI: Juan Pham is an 81 y.o. male with hx of HTN, Dementia, HLD, DM on Amaryl and Glypizide, severe AS, BCC, given Amaryl by his daughter 4x daily, presented with hypoglycemia requiring several doses of D50.  His BS finally stabalized at 175.  His CXR showed low lung volume, atelectasis vs early PNA, and he was given IV Zithromax.  His family reported no coughs, fever, chills, and labs showed no leukocytosis and no fever.  His Cr is normal at 1.1.  Hospitalist was asked to admit him for hypoglycemia, drug induced, including Amaryl given incorrect frequency.   Rewiew of Systems: Unable.     Past Medical History  Diagnosis Date  . Hypertension   . Hyperlipidemia   . Bradycardia   . Diabetes mellitus without complication (HCC)     type II  . Aortic stenosis   . Diastolic dysfunction     grade 1  . Dementia   . Atrial fibrillation [facility])     Past Surgical History  Procedure Laterality Date  . Cateract    . Retna    . Cardiac catheterization N/A 12/22/2014    Procedure: Right/Left Heart Cath and Coronary Angiography;  Surgeon: Lennette Bihari, MD;  Location: [facility] INVASIVE CV LAB;  Service: Cardiovascular;  Laterality: N/A;    Medications:  HOME MEDS: Prior to Admission medications   Medication Sig Start Date End Date Taking? Authorizing Provider  amLODipine (NORVASC) 10 MG tablet Take 10 mg by mouth daily.   Yes Historical Provider, MD  aspirin 81 MG chewable tablet Chew by mouth daily.   Yes Historical Provider, MD  carvedilol (COREG) 12.5 MG tablet Take 0.5 tablets (6.25 mg total) by mouth 2 (two) times daily with a meal. 1/2 tablet 02/01/14  Yes Lennette Bihari, MD  citalopram (CELEXA) 10 MG tablet Take 1 tablet (10 mg total) by mouth daily. 01/21/15  Yes Anson Fret, MD  donepezil (ARICEPT) 5 MG tablet Take 5 mg by  mouth daily. 11/23/14  Yes Historical Provider, MD  EASYMAX TEST test strip Inject 8 strips as directed daily. 11/24/14  Yes Historical Provider, MD  fenofibrate micronized (LOFIBRA) 134 MG capsule TAKE 1 BY MOUTH DAILY 03/22/14  Yes Lennette Bihari, MD  glimepiride (AMARYL) 2 MG tablet Take 2 mg by mouth daily with breakfast.  09/01/14  Yes Historical Provider, MD  glipiZIDE (GLUCOTROL) 5 MG tablet Take 20 mg by mouth daily before breakfast.   Yes Historical Provider, MD  hydrochlorothiazide (MICROZIDE) 12.5 MG capsule Take 1 capsule (12.5 mg total) by mouth daily. 12/16/14  Yes Lennette Bihari, MD  HYDROcodone-acetaminophen (NORCO) 10-325 MG tablet Take 1 tablet by mouth every 6 (six) hours as needed. 01/04/15  Yes Historical Provider, MD  HYDROcodone-acetaminophen (NORCO) 7.5-325 MG per tablet Take 1 tablet by mouth daily. 12/07/14  Yes Historical Provider, MD  insulin NPH-regular (NOVOLIN 70/30) (70-30) 100 UNIT/ML injection Inject into the skin 2 (two) times daily with a meal. 25 units in the AM and 5 units in the PM.   Yes Historical Provider, MD  memantine (NAMENDA) 5 MG tablet Take 1 tablet by mouth daily. 01/04/15  Yes Historical Provider, MD  simvastatin (ZOCOR) 20 MG tablet Take 20 mg by mouth daily.   Yes Historical Provider, MD  tamsulosin (FLOMAX) 0.4 MG CAPS capsule Take 1 capsule  by mouth daily. 12/28/14  Yes Historical Provider, MD     Allergies:  No Known Allergies  Social History:   reports that he quit smoking about 32 years ago. His smoking use included Pipe. He has never used smokeless tobacco. He reports that he does not drink alcohol or use illicit drugs.  Family History: Family History  Problem Relation Age of Onset  . Heart disease Mother   . Dementia Neg Hx      Physical Exam: Filed Vitals:   02/17/15 0315 02/17/15 0330 02/17/15 0345 02/17/15 0357  BP:  166/44    Pulse: 43 47 47   Temp:    95.1 F (35.1 C)  TempSrc:    Rectal  Resp: Height:       Weight:      SpO2: 97% 99% 98%    Blood pressure 166/44, pulse 47, temperature 95.1 F (35.1 C), temperature source Rectal, resp. rate 12, height  (1.727 m), weight 63.504 kg (140 lb), SpO2 98 %.  GEN:  Pleasant  patient lying in the stretcher in no acute distress; PSYCH: does not appear anxious or depressed; affect is appropriate. HEENT: Mucous membranes pink and anicteric; PERRLA; EOM intact; no cervical lymphadenopathy nor thyromegaly or carotid bruit; no JVD; There were no stridor. Neck is very supple. Breasts:: Not examined CHEST WALL: No tenderness CHEST: Normal respiration, clear to auscultation bilaterally.  HEART: Regular rate and rhythm.  There are no murmur, rub, or gallops.   BACK: No kyphosis or scoliosis; no CVA tenderness ABDOMEN: soft and non-tender; no masses, no organomegaly, normal abdominal bowel sounds; no pannus; no intertriginous candida. There is no rebound and no distention. Rectal Exam: Not done EXTREMITIES: No bone or joint deformity; age-appropriate arthropathy of the hands and knees; no edema; no ulcerations.  There is no calf tenderness. Genitalia: not examined PULSES: 2+ and symmetric SKIN: Normal hydration no rash or ulceration CNS: Cranial nerves 2-12 grossly intact no focal lateralizing neurologic deficit.  Speech is fluent; uvula elevated with phonation, facial symmetry and tongue midline. DTR are normal bilaterally, cerebella exam is intact, barbinski is negative and strengths are equaled bilaterally.  No sensory loss.   Labs on Admission:  Basic Metabolic Panel:  Recent Labs Lab 02/17/15 0142  NA 139  K 3.6  CL 104  CO2 29  GLUCOSE 65  BUN 33*  CREATININE 1.16  CALCIUM 9.3   CBC:  Recent Labs Lab 02/17/15 0142  WBC 8.5  NEUTROABS 6.3  HGB 12.6*  HCT 37.3*  MCV 90.5  PLT 296    CBG:  Recent Labs Lab 02/17/15 0148 02/17/15 0158 02/17/15 0217 02/17/15 0237 02/17/15 0308  GLUCAP 62* 163* 98 170* 131*      Radiological Exams on Admission: Dg Chest Port 1 View  02/17/2015  CLINICAL DATA:  Hypoglycemia.  Confusion tonight. EXAM: PORTABLE CHEST 1 VIEW COMPARISON:  09/17/2014 FINDINGS: Lung volumes are low. Cardiomediastinal contour is with cardiomegaly are unchanged. Patchy opacity at the left lung base, unchanged right basilar atelectasis. No pleural effusion pneumothorax or pulmonary edema. IMPRESSION: Low lung volumes. Patchy opacity at the left lung base, atelectasis versus early pneumonia. Minimal right basilar atelectasis. Electronically Signed   By: Rubye Oaks M.D.   On: 02/17/2015 02:51   Assessment/Plan Present on Admission:  . Hypoglycemia secondary to sulfonylurea . Hypertension . Hyperlipidemia . Hypoglycemia  PLAN:  Will admit him for OBS, as Amaryl is a very long acting hyperglycemic drug.  I  would d/c it all together.  Will hold his insulin and glucotrol for now as well.  Will continue with his D5 until Amaryl is out of his system.  I don't think he has PNA, so I will stop his antibiotics.  He is stable, full code, and will be admitted OBS under [facility].   Thank you and Good Day.   Other plans as per orders.  Code Status: FULL Unk LightningODE.    Joushua Dugar, MD. [facility] Pager [phone] 7pm to 7am.  02/17/2015, 4:03 AM

## 2015-02-17 NOTE — Progress Notes (Signed)
Patient is an 81 year old man with a history of type 2 diabetes mellitus on sulfonylureas and insulin, hypertension, dementia , bradycardia, and diastolic dysfunction, who was admitted this morning by Dr. Conley RollsLe for hypoglycemia. The patient was seen and examined. His chart, vital signs, laboratory studies were reviewed. Agree with current management With exception/additions below.   -Patient's CBGs are consistently above 150, so we'll decrease the dextrose infusion and start sliding scale NovoLog and Lantus.  -Agree that sulfonylureas should be discontinued indefinitely in this 81 year old man.  -Ordered TSH and free T4 for further evaluation of bradycardia , although it appears that this is chronic.  -Will order hemoglobin A1c.

## 2015-02-17 NOTE — Progress Notes (Signed)
Initial Nutrition Assessment  INTERVENTION:  Ensure Enlive po BID, each supplement provides 350 kcal and 20 grams of protein   CHO Modified diet with thin liquids   NUTRITION DIAGNOSIS:    GOAL: Pt to meet >/= 90% of their estimated nutrition needs     MONITOR: Po intake, labs and wt trends      REASON FOR ASSESSMENT:   Malnutrition Screening Tool    ASSESSMENT: Pt admitted with hypoglycemia. Hx of DM, Dementia,  HLD and HTN. He is on a CHO Modified diet but intake unknown at this time. His weight hx is stable the past 6 months with weight 146-155#. He is at risk for changes in po intake, weight loss and decline with dementia disease progression. Will continue to follow while he is here. Pt is receiving Ensure Enlive BID and staff is encouraging intake. He doesn't meet criteria for malnutrition  at this time.    Diet Order:  Diet Carb Modified Fluid consistency:: Thin; Room service appropriate?: Yes  Skin:   intact  Last BM:   prior to admission  Height:   Ht Readings from Last 1 Encounters:  02/17/15 5\' 8"  (1.727 m)    Weight:   Wt Readings from Last 1 Encounters:  02/17/15 146 lb 2.6 oz (66.3 kg)    Ideal Body Weight:  70 kg  BMI:  Body mass index is 22.23 kg/(m^2).  Estimated Nutritional Needs:   Kcal:  1800-2000   Protein:  79-85 gr  Fluid:  1.8-2.0 liters daily  EDUCATION NEEDS: none identified at this time    Juan ShiversLynn Daron Stutz MS,RD,CSG,LDN Office: #[phone] Pager: [phone]

## 2015-02-17 NOTE — ED Notes (Signed)
POC CBG 170 after 1/2 amp D50

## 2015-02-17 NOTE — Progress Notes (Signed)
Family requested for RN to notify MD about concerns of patient's legs and tests wanting to be performed. MD notified/aware. RN discussed concerns to family and is aware of situation. Lesly Dukesachel J Everett, RN

## 2015-02-17 NOTE — ED Notes (Signed)
Per EMS found pt at home with blood sugar of 40.  Started IV and gave 1 AMP of D50 and blood sugar came up to 250.  Per bystanders, pt is back to normal mental status with severe dementia,. However, daughter wanted to have pt evaluated.

## 2015-02-17 NOTE — Progress Notes (Signed)
Hypoglycemic Event  CBG: 66  Treatment: 15 GM carbohydrate snack  Symptoms: None  Follow-up CBG: Time:1811 CBG Result:255  Possible Reasons for Event: Medication regimen: Lantus and Novolog  Comments/MD notified:Dr. Fisher aware.  New orders to follow.    Antia Rahal, Joyice FasterJessica Lynn

## 2015-02-17 NOTE — Care Management Obs Status (Signed)
MEDICARE OBSERVATION STATUS NOTIFICATION   Patient Details  Name: Juan Pham MRN: [id number] Date of Birth: [birth year]   Medicare Observation Status Notification Given:  Yes    Malcolm MetroChildress, Illyanna Petillo Demske, RN 02/17/2015, 2:43 PM

## 2015-02-17 NOTE — ED Notes (Signed)
POC CBG 163 post 1/2 Amp D50

## 2015-02-17 NOTE — Care Management Note (Signed)
Case Management Note  Patient Details  Name: Ellie LunchRalph C Teeple MRN: [id number] Date of Birth: 06-Apr-1931  Subjective/Objective:                  Pt is confused but pleasant at this time. Pt's daughter at bedside to provide info. Pt lives alone and is ind at baseline. Daughter states that his orientation comes and goes but for the most part he is oriented at baseline, at least enough to live alone and function ind. Pt uses a walker PRN. Pt has a BSC. Pt has no other DME. Pt has no HH services.   Action/Plan: Pt's daughter plans on him returning home with self care at DC. Pt will need PT eval prior to discharge.   Expected Discharge Date:   02/19/2015               Expected Discharge Plan:  Home/Self Care  In-House Referral:  NA  Discharge planning Services  CM Consult  Post Acute Care Choice:  NA Choice offered to:  NA  DME Arranged:    DME Agency:     HH Arranged:    HH Agency:     Status of Service:  In process, will continue to follow  Medicare Important Message Given:    Date Medicare IM Given:    Medicare IM give by:    Date Additional Medicare IM Given:    Additional Medicare Important Message give by:     If discussed at Long Length of Stay Meetings, dates discussed:    Additional Comments:  Malcolm MetroChildress, Teaira Croft Demske, RN 02/17/2015, 2:44 PM

## 2015-02-17 NOTE — ED Provider Notes (Signed)
CSN: [id number]     Arrival date & time 02/17/15  0123 History   First MD Initiated Contact with Patient 02/17/15 [phone]     Chief Complaint  Patient presents with  . Hypoglycemia      Patient is a 81 y.o. male presenting with hypoglycemia. The history is provided by the patient, a caregiver and a relative. The history is limited by the condition of the patient (Hx dementia).  Hypoglycemia Pt was seen at 0215. Per pt's family: Family states pt's home CBG's have "been running crazy" for the past few days. Family gave pt his insulin at 1730 and pt ate. Pt's family checked his CBG again at 2030, noted it was "in the 300's again," so they gave pt another dose of insulin. Pt also took his usual oral hypoglycemics this evening. Pt's family states pt "fell asleep in the chair" afterwards. Family states pt "started to act funny" at 0030 PTA: "staring off," "talking about his dead wife," and "lifting his arm in the air." EMS states pt's CBG was 40 on their arrival to scene. IV D50 was given with CBG increasing to 250. Family states pt then returned to his usual baseline dementia.    Past Medical History  Diagnosis Date  . Hypertension   . Hyperlipidemia   . Bradycardia   . Diabetes mellitus without complication (HCC)     type II  . Aortic stenosis   . Diastolic dysfunction     grade 1  . Dementia   . Atrial fibrillation [facility])    Past Surgical History  Procedure Laterality Date  . Cateract    . Retna    . Cardiac catheterization N/A 12/22/2014    Procedure: Right/Left Heart Cath and Coronary Angiography;  Surgeon: Lennette Bihari, MD;  Location: [facility] INVASIVE CV LAB;  Service: Cardiovascular;  Laterality: N/A;   Family History  Problem Relation Age of Onset  . Heart disease Mother   . Dementia Neg Hx    Social History  Substance Use Topics  . Smoking status: Former Smoker    Types: Pipe    Quit date: 10/21/1982  . Smokeless tobacco: Never Used  . Alcohol Use: No    Review of Systems   Unable to perform ROS: Dementia      Allergies  Review of patient's allergies indicates no known allergies.  Home Medications   Prior to Admission medications   Medication Sig Start Date End Date Taking? Authorizing Provider  amLODipine (NORVASC) 10 MG tablet Take 10 mg by mouth daily.   Yes Historical Provider, MD  aspirin 81 MG chewable tablet Chew by mouth daily.   Yes Historical Provider, MD  carvedilol (COREG) 12.5 MG tablet Take 0.5 tablets (6.25 mg total) by mouth 2 (two) times daily with a meal. 1/2 tablet 02/01/14  Yes Lennette Bihari, MD  citalopram (CELEXA) 10 MG tablet Take 1 tablet (10 mg total) by mouth daily. 01/21/15  Yes Anson Fret, MD  donepezil (ARICEPT) 5 MG tablet Take 5 mg by mouth daily. 11/23/14  Yes Historical Provider, MD  EASYMAX TEST test strip Inject 8 strips as directed daily. 11/24/14  Yes Historical Provider, MD  fenofibrate micronized (LOFIBRA) 134 MG capsule TAKE 1 BY MOUTH DAILY 03/22/14  Yes Lennette Bihari, MD  glimepiride (AMARYL) 2 MG tablet Take 2 mg by mouth daily with breakfast.  09/01/14  Yes Historical Provider, MD  glipiZIDE (GLUCOTROL) 5 MG tablet Take 20 mg by mouth daily before breakfast.  Yes Historical Provider, MD  hydrochlorothiazide (MICROZIDE) 12.5 MG capsule Take 1 capsule (12.5 mg total) by mouth daily. 12/16/14  Yes Lennette Bihari, MD  HYDROcodone-acetaminophen (NORCO) 10-325 MG tablet Take 1 tablet by mouth every 6 (six) hours as needed. 01/04/15  Yes Historical Provider, MD  HYDROcodone-acetaminophen (NORCO) 7.5-325 MG per tablet Take 1 tablet by mouth daily. 12/07/14  Yes Historical Provider, MD  insulin NPH-regular (NOVOLIN 70/30) (70-30) 100 UNIT/ML injection Inject into the skin 2 (two) times daily with a meal. 25 units in the AM and 5 units in the PM.   Yes Historical Provider, MD  memantine (NAMENDA) 5 MG tablet Take 1 tablet by mouth daily. 01/04/15  Yes Historical Provider, MD  simvastatin (ZOCOR) 20 MG tablet Take 20 mg by  mouth daily.   Yes Historical Provider, MD  tamsulosin (FLOMAX) 0.4 MG CAPS capsule Take 1 capsule by mouth daily. 12/28/14  Yes Historical Provider, MD   BP 166/44 mmHg  Pulse 47  Temp(Src) 95.1 F (35.1 C) (Rectal)  Resp 12  Ht  (1.727 m)  Wt 140 lb (63.504 kg)  BMI 21.29 kg/m2  SpO2 98% Physical Exam 0215: Physical examination:  Nursing notes reviewed; Vital signs and O2 SAT reviewed;  Constitutional: Well developed, Well nourished, Well hydrated, In no acute distress; Head:  Normocephalic, atraumatic; Eyes: EOMI, PERRL, No scleral icterus; ENMT: Mouth and pharynx normal, Mucous membranes moist; Neck: Supple, Full range of motion, No lymphadenopathy; Cardiovascular: Bradycardic rate and rhythm, No gallop; Respiratory: Breath sounds coarse & equal bilaterally, No wheezes. Normal respiratory effort/excursion; Chest: Nontender, Movement normal; Abdomen: Soft, Nontender, Nondistended, Normal bowel sounds; Genitourinary: No CVA tenderness; Extremities: Pulses normal, No tenderness, No edema, No calf edema or asymmetry.; Neuro: Awake, alert, confused per hx dementia. No facial droop. Speech clear. Moves all extremities on stretcher spontaneously.; Skin: Color normal, Warm, Dry.   ED Course  Procedures (including critical care time) Labs Review   Imaging Review  I have personally reviewed and evaluated these images and lab results as part of my medical decision-making.   EKG Interpretation   Date/Time:  Thursday February 17 2015 02:01:10 EST Ventricular Rate:  45 PR Interval:  212 QRS Duration: 101 QT Interval:  519 QTC Calculation: 449 R Axis:   45 Text Interpretation:  Sinus bradycardia When compared with ECG of  12/22/2014 No significant change was found Confirmed by [facility]  MD,  Nicholos Johns [phone]) on 02/17/2015 2:48:30 AM      MDM  MDM Reviewed: previous chart, nursing note and vitals Reviewed previous: labs and ECG Interpretation: labs, ECG and x-ray Total time  providing critical care: 30-74 minutes. This excludes time spent performing separately reportable procedures and services. Consults: admitting MD     CRITICAL CARE Performed by: Laray Anger Total critical care time: 35 minutes Critical care time was exclusive of separately billable procedures and treating other patients. Critical care was necessary to treat or prevent imminent or life-threatening deterioration. Critical care was time spent personally by me on the following activities: development of treatment plan with patient and/or surrogate as well as nursing, discussions with consultants, evaluation of patient's response to treatment, examination of patient, obtaining history from patient or surrogate, ordering and performing treatments and interventions, ordering and review of laboratory studies, ordering and review of radiographic studies, pulse oximetry and re-evaluation of patient's condition.   Results for orders placed or performed during the hospital encounter of 02/17/15  CBC with Differential  Result Value Ref Range   WBC  8.5 4.0 - 10.5 K/uL   RBC 4.12 (L) 4.22 - 5.81 MIL/uL   Hemoglobin 12.6 (L) 13.0 - 17.0 g/dL   HCT 78.237.3 (L) 95.639.0 - 21.352.0 %   MCV 90.5 78.0 - 100.0 fL   MCH 30.6 26.0 - 34.0 pg   MCHC 33.8 30.0 - 36.0 g/dL   RDW 08.613.3 57.811.5 - 46.915.5 %   Platelets 296 150 - 400 K/uL   Neutrophils Relative % 75 %   Neutro Abs 6.3 1.7 - 7.7 K/uL   Lymphocytes Relative 14 %   Lymphs Abs 1.2 0.7 - 4.0 K/uL   Monocytes Relative 8 %   Monocytes Absolute 0.7 0.1 - 1.0 K/uL   Eosinophils Relative 2 %   Eosinophils Absolute 0.2 0.0 - 0.7 K/uL   Basophils Relative 1 %   Basophils Absolute 0.1 0.0 - 0.1 K/uL  Basic metabolic panel  Result Value Ref Range   Sodium 139 135 - 145 mmol/L   Potassium 3.6 3.5 - 5.1 mmol/L   Chloride 104 101 - 111 mmol/L   CO2 29 22 - 32 mmol/L   Glucose, Bld 65 65 - 99 mg/dL   BUN 33 (H) 6 - 20 mg/dL   Creatinine, Ser 6.291.16 0.61 - 1.24 mg/dL    Calcium 9.3 8.9 - 52.810.3 mg/dL   GFR calc non Af Amer 57 (L) >60 mL/min   GFR calc Af Amer >60 >60 mL/min   Anion gap 6 5 - 15  Urinalysis, Routine w reflex microscopic (not [facility])  Result Value Ref Range   Color, Urine YELLOW YELLOW   APPearance CLEAR CLEAR   Specific Gravity, Urine 1.020 1.005 - 1.030   pH 7.0 5.0 - 8.0   Glucose, UA 100 (A) NEGATIVE mg/dL   Hgb urine dipstick TRACE (A) NEGATIVE   Bilirubin Urine NEGATIVE NEGATIVE   Ketones, ur NEGATIVE NEGATIVE mg/dL   Protein, ur TRACE (A) NEGATIVE mg/dL   Urobilinogen, UA 0.2 0.0 - 1.0 mg/dL   Nitrite NEGATIVE NEGATIVE   Leukocytes, UA NEGATIVE NEGATIVE  Urine microscopic-add on  Result Value Ref Range   Squamous Epithelial / LPF FEW (A) RARE   WBC, UA 0-2 <3 WBC/hpf   RBC / HPF 0-2 <3 RBC/hpf   Bacteria, UA MANY (A) RARE  I-Stat CG4 Lactic Acid, ED  Result Value Ref Range   Lactic Acid, Venous 1.54 0.5 - 2.0 mmol/L  CBG monitoring, ED  Result Value Ref Range   Glucose-Capillary 62 (L) 65 - 99 mg/dL  CBG monitoring, ED  Result Value Ref Range   Glucose-Capillary 163 (H) 65 - 99 mg/dL  CBG monitoring, ED  Result Value Ref Range   Glucose-Capillary 98 65 - 99 mg/dL  CBG monitoring, ED  Result Value Ref Range   Glucose-Capillary 170 (H) 65 - 99 mg/dL  CBG monitoring, ED  Result Value Ref Range   Glucose-Capillary 131 (H) 65 - 99 mg/dL   Dg Chest Port 1 View 02/17/2015  CLINICAL DATA:  Hypoglycemia.  Confusion tonight. EXAM: PORTABLE CHEST 1 VIEW COMPARISON:  09/17/2014 FINDINGS: Lung volumes are low. Cardiomediastinal contour is with cardiomegaly are unchanged. Patchy opacity at the left lung base, unchanged right basilar atelectasis. No pleural effusion pneumothorax or pulmonary edema. IMPRESSION: Low lung volumes. Patchy opacity at the left lung base, atelectasis versus early pneumonia. Minimal right basilar atelectasis. Electronically Signed   By: Rubye OaksMelanie  Ehinger M.D.   On: 02/17/2015 02:51    0305:  Pt with  persistent, recurrently hypoglycemia while  in the ED: Pt given IV D50 bolus x2, and IVF changed to D5NS with improvement.  Hypothermic on arrival, bear hugger applied. IV abx started for CAP. Dx and testing d/w pt's family.  Questions answered.  Verb understanding, agreeable to admit.   T/C to [facility] Dr. Conley Rolls, case discussed, including:  HPI, pertinent PM/SHx, VS/PE, dx testing, ED course and treatment:  Agreeable to admit, requests he will come to the ED for evaluation.   Samuel Jester, DO 02/18/15 0003

## 2015-02-18 ENCOUNTER — Encounter (HOSPITAL_COMMUNITY): Payer: Self-pay | Admitting: Internal Medicine

## 2015-02-18 DIAGNOSIS — F0390 Unspecified dementia without behavioral disturbance: Secondary | ICD-10-CM | POA: Diagnosis not present

## 2015-02-18 DIAGNOSIS — E119 Type 2 diabetes mellitus without complications: Secondary | ICD-10-CM | POA: Diagnosis not present

## 2015-02-18 DIAGNOSIS — T383X1A Poisoning by insulin and oral hypoglycemic [antidiabetic] drugs, accidental (unintentional), initial encounter: Secondary | ICD-10-CM | POA: Diagnosis not present

## 2015-02-18 DIAGNOSIS — R001 Bradycardia, unspecified: Secondary | ICD-10-CM

## 2015-02-18 DIAGNOSIS — I10 Essential (primary) hypertension: Secondary | ICD-10-CM

## 2015-02-18 LAB — CBC
HEMATOCRIT: 35.5 % — AB (ref 39.0–52.0)
HEMOGLOBIN: 12 g/dL — AB (ref 13.0–17.0)
MCH: 30.8 pg (ref 26.0–34.0)
MCHC: 33.8 g/dL (ref 30.0–36.0)
MCV: 91.3 fL (ref 78.0–100.0)
Platelets: 296 10*3/uL (ref 150–400)
RBC: 3.89 MIL/uL — AB (ref 4.22–5.81)
RDW: 13.8 % (ref 11.5–15.5)
WBC: 7.7 10*3/uL (ref 4.0–10.5)

## 2015-02-18 LAB — BASIC METABOLIC PANEL
Anion gap: 5 (ref 5–15)
BUN: 27 mg/dL — ABNORMAL HIGH (ref 6–20)
CHLORIDE: 109 mmol/L (ref 101–111)
CO2: 26 mmol/L (ref 22–32)
CREATININE: 1.07 mg/dL (ref 0.61–1.24)
Calcium: 8.6 mg/dL — ABNORMAL LOW (ref 8.9–10.3)
GFR calc non Af Amer: >60 mL/min (ref >60)
Glucose, Bld: 155 mg/dL — ABNORMAL HIGH (ref 65–99)
POTASSIUM: 4.1 mmol/L (ref 3.5–5.1)
Sodium: 140 mmol/L (ref 135–145)

## 2015-02-18 LAB — HEMOGLOBIN A1C
Hgb A1c MFr Bld: 9.2 % — ABNORMAL HIGH (ref 4.8–5.6)
Mean Plasma Glucose: 217 mg/dL

## 2015-02-18 LAB — GLUCOSE, CAPILLARY
GLUCOSE-CAPILLARY: 155 mg/dL — AB (ref 65–99)
GLUCOSE-CAPILLARY: 176 mg/dL — AB (ref 65–99)
GLUCOSE-CAPILLARY: 126 mg/dL — AB (ref 65–99)
Glucose-Capillary: 255 mg/dL — ABNORMAL HIGH (ref 65–99)
Glucose-Capillary: 198 mg/dL — ABNORMAL HIGH (ref 65–99)

## 2015-02-18 MED ORDER — INSULIN ASPART 100 UNIT/ML ~~LOC~~ SOLN
0.0000 [IU] | SUBCUTANEOUS | Status: DC
  Administered 2015-02-18: 5 [IU] via SUBCUTANEOUS

## 2015-02-18 MED ORDER — INSULIN NPH ISOPHANE & REGULAR (70-30) 100 UNIT/ML ~~LOC~~ SUSP: SUBCUTANEOUS | Status: DC

## 2015-02-18 NOTE — Care Management Note (Signed)
Case Management Note  Patient Details  Name: Juan Pham MRN: [id number] Date of Birth: Aug 13, 1930  Subjective/Objective:                    Action/Plan:   Expected Discharge Date:                  Expected Discharge Plan:  Home/Self Care  In-House Referral:  NA  Discharge planning Services  CM Consult  Post Acute Care Choice:  NA Choice offered to:  NA  DME Arranged:    DME Agency:     HH Arranged:    HH Agency:     Status of Service:  Completed, signed off  Medicare Important Message Given:    Date Medicare IM Given:    Medicare IM give by:    Date Additional Medicare IM Given:    Additional Medicare Important Message give by:     If discussed at Long Length of Stay Meetings, dates discussed:    Additional Comments: Pt discharged home today. No CM needs noted. Arlyss QueenBlackwell, [place], RN 02/18/2015, 2:16 PM

## 2015-02-18 NOTE — Evaluation (Signed)
Physical Therapy Evaluation Patient Details Name: Juan Pham MRN: [id number] DOB: [birth year] Today's Date: 02/18/2015   History of Present Illness  Juan LunchRalph C Russell is an 81 y.o. male with hx of HTN, Dementia, HLD, DM on Amaryl and Glypizide, severe AS, BCC, given Amaryl by his daughter 4x daily, presented with hypoglycemia requiring several doses of D50. His BS finally stabalized at 175. His CXR showed low lung volume, atelectasis vs early PNA, and he was given IV Zithromax. His family reported no coughs, fever, chills, and labs showed no leukocytosis and no fever. His Cr is normal at 1.1. Hospitalist was asked to admit him for hypoglycemia, drug induced, including Amaryl given incorrect frequency.   Clinical Impression   Pt was seen for evaluation and found to be at functional baseline.  He is very stable in transfers and gait, has close supervision from daughter.    Follow Up Recommendations No PT follow up    Equipment Recommendations  None recommended by PT    Recommendations for Other Services   none    Precautions / Restrictions Precautions Precautions: None Restrictions Weight Bearing Restrictions: No      Mobility  Bed Mobility Overal bed mobility: Independent                Transfers Overall transfer level: Independent                  Ambulation/Gait Ambulation/Gait assistance: Modified independent (Device/Increase time) Ambulation Distance (Feet): 350 Feet Assistive device: Rolling walker (2 wheeled) Gait Pattern/deviations: WFL(Within Functional Limits)   Gait velocity interpretation: >2.62 ft/sec, indicative of independent Company secretarycommunity ambulator    Stairs            Wheelchair Mobility    Modified Rankin (Stroke Patients Only)       Balance Overall balance assessment: No apparent balance deficits (not formally assessed)                                           Pertinent Vitals/Pain Pain Assessment:  No/denies pain    Home Living Family/patient expects to be discharged to:: Private residence Living Arrangements: Alone Available Help at Discharge: Family;Available 24 hours/day Type of Home: House Home Access: Stairs to enter Entrance Stairs-Rails: Right Entrance Stairs-Number of Steps: 3 Home Layout: One level Home Equipment: Walker - 2 wheels      Prior Function Level of Independence: Independent               Hand Dominance        Extremity/Trunk Assessment   Upper Extremity Assessment: Overall WFL for tasks assessed           Lower Extremity Assessment: Overall WFL for tasks assessed      Cervical / Trunk Assessment: Kyphotic  Communication   Communication: No difficulties  Cognition Arousal/Alertness: Awake/alert Behavior During Therapy: WFL for tasks assessed/performed Overall Cognitive Status: History of cognitive impairments - at baseline                      General Comments      Exercises        Assessment/Plan    PT Assessment Patent does not need any further PT services  PT Diagnosis     PT Problem List    PT Treatment Interventions     PT Goals (Current goals can be found in  the Care Plan section) Acute Rehab PT Goals PT Goal Formulation: All assessment and education complete, DC therapy    Frequency     Barriers to discharge        Co-evaluation               End of Session Equipment Utilized During Treatment: Gait belt Activity Tolerance: Patient tolerated treatment well Patient left: in bed;with call bell/phone within reach      Functional Assessment Tool Used: cliical judgement Functional Limitation: Mobility: Walking and moving around Mobility: Walking and Moving Around Current Status [phone]): 0 percent impaired, limited or restricted Mobility: Walking and Moving Around Goal Status [phone]): 0 percent impaired, limited or restricted Mobility: Walking and Moving Around Discharge Status [phone]): 0  percent impaired, limited or restricted    Time: 0835-0900 PT Time Calculation (min) (ACUTE ONLY): 25 min   Charges:   PT Evaluation $Initial PT Evaluation Tier I: 1 Procedure     PT G Codes:   PT G-Codes **NOT FOR INPATIENT CLASS** Functional Assessment Tool Used: cliical judgement Functional Limitation: Mobility: Walking and moving around Mobility: Walking and Moving Around Current Status (W4132): 0 percent impaired, limited or restricted Mobility: Walking and Moving Around Goal Status (G4010): 0 percent impaired, limited or restricted Mobility: Walking and Moving Around Discharge Status (U7253): 0 percent impaired, limited or restricted    Konrad Penta  PT 02/18/2015, 9:07 AM [phone]

## 2015-02-18 NOTE — Progress Notes (Signed)
Pt CBG 198.

## 2015-02-18 NOTE — Progress Notes (Signed)
Pt CBG 126 

## 2015-02-18 NOTE — Discharge Summary (Signed)
Physician Discharge Summary  Juan LunchRalph C Pham NFA:[id number] DOB: December 11, 1930 DOA: 02/17/2015  PCP: Cassell SmilesFUSCO,LAWRENCE J., MD  Admit date: 02/17/2015 Discharge date: 02/18/2015  Time spent: Greater than 30 minutes  Recommendations for Outpatient Follow-up:  1. Recommend close monitoring of the patient's diabetes. Oral sulfonylurea medications were discontinued.  2. Recommend follow-up of the patient's heart rate on his beta blocker with mild bradycardia   Discharge Diagnoses:  1. Hypoglycemia secondary to sulfonylurea. 2. Type 2 diabetes mellitus. 3. Essential hypertension. 4. Mild asymptomatic bradycardia secondary to beta blocker. 5. Transient hypothermic and thought to be secondary to hypoglycemia. No evidence of infection or sepsis. 6. History of severe aortic stenosis. 7. Dementia.   Discharge Condition: Improved.  Diet recommendation: Heart healthy/carbohydrate modified.  Filed Weights   02/17/15 0128 02/17/15 0511  Weight: 63.504 kg (140 lb) 66.3 kg (146 lb 2.6 oz)    History of present illness:  The patient is an 81 year old man with a history of type 2 diabetes mellitus, hypertension, aortic stenosis, and dementia, who presented to the emergency department on 02/16/2015 with a chief complaint of low blood sugars at home as reported by his daughter. Apparently, the patient had been given Amaryl 4 times a day. He had been treated in the recent past with glipizide and 70/30 insulin. EMS was called and when they arrived, the patient's CBG was 40. He was given IV D50 and his CBG increased to 250. When he arrived to the ED, his CBG was 62. He was hypothermic and bradycardic. He was admitted for further evaluation and management.  Hospital Course:  Amaryl was discontinued indefinitely. Insulin was withheld. Patient was given 2 amps of D50 in the ED. He was eventually started on a dextrose infusion. His CBGs were checked every 2 hours for close monitoring. Patient was noted to be  hypothermic on admission. A warmer was provided and his temperature improved. The ED physician gave him 1 dose of IV azithromycin and IV Rocephin for possible pneumonia based on the chest x-ray in the ED that showed patchy bibasilar atelectasis, but pneumonia could not be ruled out. The patient, however, had no pulmonary symptoms and no leukocytosis, therefore, it was felt that he did not have pneumonia and antibiotic therapy was not continued. His hypothermia may have been secondary to the cold ambient temperature when he arrived to the ED. Carvedilol was temporarily withheld because his heart rate was in the lower 40s to lower 50s. When his heart rate and temperature improved, carvedilol was restarted at his usual home dose. After the restart of the carvedilol, the patient's heart rate improved to the low 60s. He had no apparent sequelae of the bradycardia. His thyroid  function was assessed and his TSH and free T4 were both within normal limits.   The patient's CBGs improved. Sliding scale NovoLog was started. The patient's daughter was concerned about long-term treatment of the patient's diabetes going forward. I advised  that the patient not be treated with an oral hypoglycemic agent at all because of his propensity for hypoglycemia in the setting of dementia, elderly state and history of marginal meal intake. Therefore, the patient was prescribed 70/30 insulin 12 units 2 times daily  at the time of discharge. Additional instructions included increasing the 70/30 insulin by 2 units every 3-5 days until his average blood sugars ranged between 120-160. Hopefully, this regimen will decrease his propensity for hypoglycemia and wide swings of hyperglycemia. His hemoglobin A1c was 9.2.  Further management will be deferred to his  PCP.     Procedures:  None  Consultations:  None  Discharge Exam: Filed Vitals:   02/18/15 0918  BP: 168/42  Pulse:   Temp:   Resp:    oxygen saturation 98% on room  air. Temperature 97.9. Respiratory rate 20.   General: Pleasant alert elderly 81 year old man in no acute distress.  Cardiovascular: S1, S2, with occasional ectopic beat.  Respiratory: decreased breath sounds in the bases, otherwise clear anteriorly.   Discharge Instructions   Discharge Instructions    Diet - low sodium heart healthy    Complete by:  As directed      Diet Carb Modified    Complete by:  As directed      Discharge instructions    Complete by:  As directed   CHECK YOUR BLOOD SUGARS IN THE MORNING AND IN THE EVENING.     Increase activity slowly    Complete by:  As directed           Current Discharge Medication List    CONTINUE these medications which have CHANGED   Details  insulin NPH-regular Human (NOVOLIN 70/30) (70-30) 100 UNIT/ML injection Take 12 units 2 times daily. If after 3 days, his blood sugar is above 200 consistently, increase the units by 2 two times daily for 5 days; if his blood sugar is still above 200, increase the units by 2 again and continue for 5 more days. Continue this regimen until his blood sugars are ranging between 120 and 160. Qty: 10 mL, Refills: 11      CONTINUE these medications which have NOT CHANGED   Details  amLODipine (NORVASC) 10 MG tablet Take 10 mg by mouth daily.    aspirin 81 MG chewable tablet Chew by mouth daily.    carvedilol (COREG) 12.5 MG tablet Take 0.5 tablets (6.25 mg total) by mouth 2 (two) times daily with a meal. 1/2 tablet Qty: 90 tablet, Refills: 3    donepezil (ARICEPT) 5 MG tablet Take 5 mg by mouth daily.    EASYMAX TEST test strip Inject 8 strips as directed daily.    fenofibrate micronized (LOFIBRA) 134 MG capsule TAKE 1 BY MOUTH DAILY Qty: 90 capsule, Refills: 2    hydrochlorothiazide (MICROZIDE) 12.5 MG capsule Take 1 capsule (12.5 mg total) by mouth daily. Qty: 90 capsule, Refills: 3    HYDROcodone-acetaminophen (NORCO) 10-325 MG tablet Take 1 tablet by mouth every 6 (six) hours as needed.     memantine (NAMENDA) 5 MG tablet Take 1 tablet by mouth daily.    simvastatin (ZOCOR) 20 MG tablet Take 20 mg by mouth daily.    tamsulosin (FLOMAX) 0.4 MG CAPS capsule Take 1 capsule by mouth daily.      STOP taking these medications     glimepiride (AMARYL) 2 MG tablet      glipiZIDE (GLUCOTROL) 5 MG tablet      citalopram (CELEXA) 10 MG tablet        No Known Allergies Follow-up Information    Follow up with Cassell Smiles., MD.   Specialty:  Internal Medicine   Why:  FOLLOW UP AS SCHEDULED.   Contact information:   [address] [place] [address] [phone]        The results of significant diagnostics from this hospitalization (including imaging, microbiology, ancillary and laboratory) are listed below for reference.    Significant Diagnostic Studies: Dg Chest Port 1 View  02/17/2015  CLINICAL DATA:  Hypoglycemia.  Confusion tonight. EXAM: PORTABLE CHEST  1 VIEW COMPARISON:  09/17/2014 FINDINGS: Lung volumes are low. Cardiomediastinal contour is with cardiomegaly are unchanged. Patchy opacity at the left lung base, unchanged right basilar atelectasis. No pleural effusion pneumothorax or pulmonary edema. IMPRESSION: Low lung volumes. Patchy opacity at the left lung base, atelectasis versus early pneumonia. Minimal right basilar atelectasis. Electronically Signed   By: Rubye Oaks M.D.   On: 02/17/2015 02:51    Microbiology: No results found for this or any previous visit (from the past 240 hour(s)).   Labs: Basic Metabolic Panel:  Recent Labs Lab 02/17/15 0142 02/18/15 0538  NA 139 140  K 3.6 4.1  CL 104 109  CO2 29 26  GLUCOSE 65 155*  BUN 33* 27*  CREATININE 1.16 1.07  CALCIUM 9.3 8.6*   Liver Function Tests: No results for input(s): AST, ALT, ALKPHOS, BILITOT, PROT, ALBUMIN in the last 168 hours. No results for input(s): LIPASE, AMYLASE in the last 168 hours. No results for input(s): AMMONIA in the last 168  hours. CBC:  Recent Labs Lab 02/17/15 0142 02/18/15 0538  WBC 8.5 7.7  NEUTROABS 6.3  --   HGB 12.6* 12.0*  HCT 37.3* 35.5*  MCV 90.5 91.3  PLT 296 296   Cardiac Enzymes: No results for input(s): CKTOTAL, CKMB, CKMBINDEX, TROPONINI in the last 168 hours. BNP: BNP (last 3 results) No results for input(s): BNP in the last 8760 hours.  ProBNP (last 3 results) No results for input(s): PROBNP in the last 8760 hours.  CBG:  Recent Labs Lab 02/17/15 2334 02/18/15 0426 02/18/15 0746 02/18/15 1020 02/18/15 1209  GLUCAP 212* 155* 176* 255* 198*       Signed:  Dariella Gillihan  [facility] 02/18/2015, 1:58 PM

## 2015-02-18 NOTE — Progress Notes (Signed)
Pt CBG 255.  

## 2015-02-18 NOTE — Progress Notes (Signed)
Pt's IV catheter removed and intact. Pt's IV site clean dry and intact. Discharge instructions, medications and follow up appointments reviewed and discussed with patient and his daughter. All questions were answered and no further questions at this time. Pt and his daughter verbalized understanding of discharge instructions and medications. Pt in stable condition and in no acute distress at time of discharge. Pt escorted by nurse tech.

## 2015-02-23 ENCOUNTER — Other Ambulatory Visit (HOSPITAL_COMMUNITY): Payer: Medicare Other

## 2015-02-25 ENCOUNTER — Other Ambulatory Visit (HOSPITAL_COMMUNITY): Payer: Self-pay | Admitting: Internal Medicine

## 2015-02-25 ENCOUNTER — Ambulatory Visit (HOSPITAL_COMMUNITY)
Admission: RE | Admit: 2015-02-25 | Discharge: 2015-02-25 | Disposition: A | Discharge status: Home / Self Care | Payer: Medicare Other | Source: Ambulatory Visit | Attending: Internal Medicine | Admitting: Internal Medicine

## 2015-02-25 DIAGNOSIS — I739 Peripheral vascular disease, unspecified: Secondary | ICD-10-CM

## 2015-02-25 DIAGNOSIS — I10 Essential (primary) hypertension: Secondary | ICD-10-CM | POA: Insufficient documentation

## 2015-02-25 DIAGNOSIS — F0390 Unspecified dementia without behavioral disturbance: Secondary | ICD-10-CM | POA: Diagnosis not present

## 2015-02-25 DIAGNOSIS — Z87891 Personal history of nicotine dependence: Secondary | ICD-10-CM | POA: Insufficient documentation

## 2015-02-25 DIAGNOSIS — M79673 Pain in unspecified foot: Secondary | ICD-10-CM | POA: Insufficient documentation

## 2015-03-01 ENCOUNTER — Ambulatory Visit (HOSPITAL_COMMUNITY): Admission: RE | Admit: 2015-03-01 | Payer: Medicare Other | Source: Ambulatory Visit

## 2015-04-12 ENCOUNTER — Encounter (HOSPITAL_COMMUNITY): Payer: Self-pay | Admitting: Emergency Medicine

## 2015-04-12 ENCOUNTER — Emergency Department (HOSPITAL_COMMUNITY): Payer: PPO

## 2015-04-12 ENCOUNTER — Observation Stay (HOSPITAL_COMMUNITY)
Admission: EM | Admit: 2015-04-12 | Discharge: 2015-04-13 | Disposition: A | Discharge status: Home / Self Care | Payer: PPO | Attending: Emergency Medicine | Admitting: Emergency Medicine

## 2015-04-12 DIAGNOSIS — Z9889 Other specified postprocedural states: Secondary | ICD-10-CM | POA: Diagnosis not present

## 2015-04-12 DIAGNOSIS — G9340 Encephalopathy, unspecified: Secondary | ICD-10-CM

## 2015-04-12 DIAGNOSIS — I350 Nonrheumatic aortic (valve) stenosis: Secondary | ICD-10-CM

## 2015-04-12 DIAGNOSIS — I10 Essential (primary) hypertension: Secondary | ICD-10-CM | POA: Diagnosis not present

## 2015-04-12 DIAGNOSIS — R011 Cardiac murmur, unspecified: Secondary | ICD-10-CM | POA: Diagnosis not present

## 2015-04-12 DIAGNOSIS — R4182 Altered mental status, unspecified: Principal | ICD-10-CM | POA: Insufficient documentation

## 2015-04-12 DIAGNOSIS — E119 Type 2 diabetes mellitus without complications: Secondary | ICD-10-CM

## 2015-04-12 DIAGNOSIS — I519 Heart disease, unspecified: Secondary | ICD-10-CM | POA: Diagnosis not present

## 2015-04-12 DIAGNOSIS — R001 Bradycardia, unspecified: Secondary | ICD-10-CM | POA: Diagnosis present

## 2015-04-12 DIAGNOSIS — R5383 Other fatigue: Secondary | ICD-10-CM | POA: Insufficient documentation

## 2015-04-12 DIAGNOSIS — I4891 Unspecified atrial fibrillation: Secondary | ICD-10-CM | POA: Diagnosis not present

## 2015-04-12 DIAGNOSIS — Z79899 Other long term (current) drug therapy: Secondary | ICD-10-CM | POA: Diagnosis not present

## 2015-04-12 DIAGNOSIS — F0390 Unspecified dementia without behavioral disturbance: Secondary | ICD-10-CM | POA: Diagnosis not present

## 2015-04-12 DIAGNOSIS — Z7982 Long term (current) use of aspirin: Secondary | ICD-10-CM | POA: Diagnosis not present

## 2015-04-12 DIAGNOSIS — Z87891 Personal history of nicotine dependence: Secondary | ICD-10-CM | POA: Diagnosis not present

## 2015-04-12 DIAGNOSIS — Z794 Long term (current) use of insulin: Secondary | ICD-10-CM | POA: Insufficient documentation

## 2015-04-12 DIAGNOSIS — E1165 Type 2 diabetes mellitus with hyperglycemia: Secondary | ICD-10-CM | POA: Diagnosis not present

## 2015-04-12 DIAGNOSIS — R410 Disorientation, unspecified: Secondary | ICD-10-CM | POA: Diagnosis present

## 2015-04-12 DIAGNOSIS — R05 Cough: Secondary | ICD-10-CM | POA: Diagnosis not present

## 2015-04-12 DIAGNOSIS — R079 Chest pain, unspecified: Secondary | ICD-10-CM | POA: Diagnosis present

## 2015-04-12 LAB — BASIC METABOLIC PANEL
Anion gap: 7 (ref 5–15)
BUN: 31 mg/dL — AB (ref 6–20)
CALCIUM: 9.6 mg/dL (ref 8.9–10.3)
CO2: 29 mmol/L (ref 22–32)
CREATININE: 1.18 mg/dL (ref 0.61–1.24)
Chloride: 100 mmol/L — ABNORMAL LOW (ref 101–111)
GFR calc Af Amer: >60 mL/min (ref >60)
GFR calc non Af Amer: 56 mL/min — ABNORMAL LOW (ref >60)
GLUCOSE: 370 mg/dL — AB (ref 65–99)
Potassium: 4.6 mmol/L (ref 3.5–5.1)
Sodium: 136 mmol/L (ref 135–145)

## 2015-04-12 LAB — TROPONIN I: Troponin I: <0.03 ng/mL (ref <0.031)

## 2015-04-12 LAB — URINALYSIS, ROUTINE W REFLEX MICROSCOPIC
BILIRUBIN URINE: NEGATIVE
Glucose, UA: 500 mg/dL — AB
KETONES UR: NEGATIVE mg/dL
Leukocytes, UA: NEGATIVE
NITRITE: NEGATIVE
PH: 5.5 (ref 5.0–8.0)
Protein, ur: NEGATIVE mg/dL
Specific Gravity, Urine: 1.015 (ref 1.005–1.030)

## 2015-04-12 LAB — CBC WITH DIFFERENTIAL/PLATELET
BASOS PCT: 2 %
Basophils Absolute: 0.1 10*3/uL (ref 0.0–0.1)
EOS ABS: 0.1 10*3/uL (ref 0.0–0.7)
Eosinophils Relative: 2 %
HCT: 38.4 % — ABNORMAL LOW (ref 39.0–52.0)
Hemoglobin: 13 g/dL (ref 13.0–17.0)
LYMPHS ABS: 1.8 10*3/uL (ref 0.7–4.0)
Lymphocytes Relative: 24 %
MCH: 30.1 pg (ref 26.0–34.0)
MCHC: 33.9 g/dL (ref 30.0–36.0)
MCV: 88.9 fL (ref 78.0–100.0)
MONO ABS: 0.6 10*3/uL (ref 0.1–1.0)
MONOS PCT: 8 %
Neutro Abs: 4.9 10*3/uL (ref 1.7–7.7)
Neutrophils Relative %: 64 %
Platelets: 302 10*3/uL (ref 150–400)
RBC: 4.32 MIL/uL (ref 4.22–5.81)
RDW: 12.7 % (ref 11.5–15.5)
WBC: 7.6 10*3/uL (ref 4.0–10.5)

## 2015-04-12 LAB — URINE MICROSCOPIC-ADD ON

## 2015-04-12 LAB — GLUCOSE, CAPILLARY
GLUCOSE-CAPILLARY: 266 mg/dL — AB (ref 65–99)
Glucose-Capillary: 270 mg/dL — ABNORMAL HIGH (ref 65–99)

## 2015-04-12 LAB — RAPID STREP SCREEN (MED CTR MEBANE ONLY): STREPTOCOCCUS, GROUP A SCREEN (DIRECT): NEGATIVE

## 2015-04-12 MED ORDER — ASPIRIN 81 MG PO CHEW
81.0000 mg | CHEWABLE_TABLET | Freq: Every day | ORAL | Status: DC
  Administered 2015-04-12 – 2015-04-13 (×2): 81 mg via ORAL
  Filled 2015-04-12 (×2): qty 1

## 2015-04-12 MED ORDER — INSULIN ASPART 100 UNIT/ML ~~LOC~~ SOLN
0.0000 [IU] | Freq: Three times a day (TID) | SUBCUTANEOUS | Status: DC
  Administered 2015-04-12: 5 [IU] via SUBCUTANEOUS
  Administered 2015-04-13: 2 [IU] via SUBCUTANEOUS
  Administered 2015-04-13: 1 [IU] via SUBCUTANEOUS

## 2015-04-12 MED ORDER — HYDRALAZINE HCL 20 MG/ML IJ SOLN: 10.0000 mg | Freq: Three times a day (TID) | INTRAMUSCULAR | Status: DC | PRN

## 2015-04-12 MED ORDER — AMLODIPINE BESYLATE 5 MG PO TABS
10.0000 mg | ORAL_TABLET | Freq: Every day | ORAL | Status: DC
  Administered 2015-04-12 – 2015-04-13 (×2): 10 mg via ORAL
  Filled 2015-04-12 (×2): qty 2

## 2015-04-12 MED ORDER — ACETAMINOPHEN 325 MG PO TABS
650.0000 mg | ORAL_TABLET | Freq: Four times a day (QID) | ORAL | Status: DC | PRN
  Administered 2015-04-12: 650 mg via ORAL
  Filled 2015-04-12 (×2): qty 2

## 2015-04-12 MED ORDER — SODIUM CHLORIDE 0.9 % IV BOLUS (SEPSIS)
1000.0000 mL | Freq: Once | INTRAVENOUS | Status: AC
  Administered 2015-04-12: 1000 mL via INTRAVENOUS

## 2015-04-12 MED ORDER — SIMVASTATIN 20 MG PO TABS
20.0000 mg | ORAL_TABLET | Freq: Every day | ORAL | Status: DC
  Administered 2015-04-12 – 2015-04-13 (×2): 20 mg via ORAL
  Filled 2015-04-12 (×2): qty 1

## 2015-04-12 MED ORDER — SODIUM CHLORIDE 0.9 % IJ SOLN: 3.0000 mL | Freq: Two times a day (BID) | INTRAMUSCULAR | Status: DC

## 2015-04-12 MED ORDER — SODIUM CHLORIDE 0.9 % IV SOLN
INTRAVENOUS | Status: AC
  Administered 2015-04-12: 17:00:00 via INTRAVENOUS

## 2015-04-12 MED ORDER — INSULIN ASPART PROT & ASPART (70-30 MIX) 100 UNIT/ML ~~LOC~~ SUSP
12.0000 [IU] | Freq: Two times a day (BID) | SUBCUTANEOUS | Status: DC
  Administered 2015-04-12 – 2015-04-13 (×2): 12 [IU] via SUBCUTANEOUS
  Filled 2015-04-12: qty 10

## 2015-04-12 MED ORDER — ENOXAPARIN SODIUM 30 MG/0.3ML ~~LOC~~ SOLN
30.0000 mg | SUBCUTANEOUS | Status: DC
  Administered 2015-04-12: 30 mg via SUBCUTANEOUS
  Filled 2015-04-12: qty 0.3

## 2015-04-12 MED ORDER — ACETAMINOPHEN 650 MG RE SUPP: 650.0000 mg | Freq: Four times a day (QID) | RECTAL | Status: DC | PRN

## 2015-04-12 MED ORDER — TAMSULOSIN HCL 0.4 MG PO CAPS
0.4000 mg | ORAL_CAPSULE | Freq: Every day | ORAL | Status: DC
  Administered 2015-04-12 – 2015-04-13 (×2): 0.4 mg via ORAL
  Filled 2015-04-12 (×2): qty 1

## 2015-04-12 NOTE — ED Notes (Signed)
C/o sore throat today. Rates pain 5/10.

## 2015-04-12 NOTE — ED Provider Notes (Signed)
CSN: [id number]     Arrival date & time 04/12/15  0901 History  By signing my name below, I, Marica OtterNusrat Rahman, attest that this documentation has been prepared under the direction and in the presence of Esperanza SheetsUlugbek N Buriev, MD. Electronically Signed: Marica OtterNusrat Rahman, ED Scribe. 04/12/2015. 12:50 PM.   LEVEL 5: DEMENTIA  Chief Complaint  Patient presents with  . Chest Pain   The history is provided by the patient and a relative (daughter). No language interpreter was used.   PCP: Cassell SmilesFUSCO,LAWRENCE J., MD HPI Comments: Juan LunchRalph C Pham is a 82 y.o. male, with PMHx noted below including HTN, DMTII, dementia, accompanied by his daughter, who presents to the Emergency Department complaining of general malaise and increased confusion onset this morning. Daughter reports that pt was in disarray this morning and stated that he was "not feeling well." Daughter notes pt has been ill recently with cold like Sx including productive cough with clear sputum. During exam pt complains of sore throat. Pt denies any chest pain during exam. Pt further denies n/v/d, constipation.  Daughter confirms a fall 8 days ago. Daughter denies fever, blood thinner use at home.  Past Medical History  Diagnosis Date  . Hypertension   . Hyperlipidemia   . Bradycardia   . Diabetes mellitus without complication (HCC)     type II  . Aortic stenosis   . Diastolic dysfunction     grade 1  . Dementia   . Atrial fibrillation (HCC)   . 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 Cateract    . Retna    . Cardiac catheterization N/A 12/22/2014    Procedure: Right/Left Heart Cath and Coronary Angiography;  Surgeon: Lennette Biharihomas A Kelly, MD;  [facility];  Service: Cardiovascular;  Laterality: N/A;   Family History  Problem Relation Age of Onset  . Heart disease Mother   . Dementia Neg Hx    Social History  Substance Use Topics  . Smoking status: Former Smoker   Types: Pipe    Quit date: 10/21/1982  . Smokeless tobacco: Never Used  . Alcohol Use: No    Review of Systems  Unable to perform ROS: Dementia   Allergies  Review of patient's allergies indicates no known allergies.  Home Medications   Prior to Admission medications   Medication Sig Start Date End Date Taking? Authorizing Provider  amLODipine (NORVASC) 10 MG tablet Take 10 mg by mouth daily.   Yes Historical Provider, MD  aspirin 81 MG chewable tablet Chew by mouth daily.   Yes Historical Provider, MD  carvedilol (COREG) 12.5 MG tablet Take 0.5 tablets (6.25 mg total) by mouth 2 (two) times daily with a meal. 1/2 tablet 02/01/14  Yes Lennette Biharihomas A Kelly, MD  donepezil (ARICEPT) 5 MG tablet Take 5 mg by mouth daily. 11/23/14  Yes Historical Provider, MD  fenofibrate micronized (LOFIBRA) 134 MG capsule TAKE 1 BY MOUTH DAILY 03/22/14  Yes Lennette Biharihomas A Kelly, MD  hydrochlorothiazide (MICROZIDE) 12.5 MG capsule Take 1 capsule (12.5 mg total) by mouth daily. 12/16/14  Yes Lennette Biharihomas A Kelly, MD  HYDROcodone-acetaminophen (NORCO) 10-325 MG tablet Take 1 tablet by mouth every 6 (six) hours as needed. 01/04/15  Yes Historical Provider, MD  insulin NPH-regular Human (NOVOLIN 70/30) (70-30) 100 UNIT/ML injection Take 12 units 2 times daily. If after 3 days, his blood sugar is above 200 consistently, increase the units by 2 two times daily for 5 days;  if his blood sugar is still above 200, increase the units by 2 again and continue for 5 more days. Continue this regimen until his blood sugars are ranging between 120 and 160. 02/18/15  Yes Elliot Cousin, MD  memantine (NAMENDA) 5 MG tablet Take 1 tablet by mouth daily. 01/04/15  Yes Historical Provider, MD  simvastatin (ZOCOR) 20 MG tablet Take 20 mg by mouth daily.   Yes Historical Provider, MD  tamsulosin (FLOMAX) 0.4 MG CAPS capsule Take 1 capsule by mouth daily. 12/28/14  Yes Historical Provider, MD   Triage Vitals: BP 180/47 mmHg  Pulse 59  Temp(Src) 98.3 F  (36.8 C) (Oral)  Resp 13  Ht 5\' 3"  (1.6 m)  Wt 146 lb (66.225 kg)  BMI 25.87 kg/m2  SpO2 96% Physical Exam  Constitutional: He appears well-developed and well-nourished. No distress.  HENT:  Head: Normocephalic and atraumatic.  Mouth/Throat: Oropharynx is clear and moist. Mucous membranes are dry. No oropharyngeal exudate.  Throat erythematous.     Eyes: Conjunctivae and EOM are normal. Pupils are equal, round, and reactive to light.  Neck: Normal range of motion. Neck supple.  No meningismus.  Cardiovascular: Normal rate, regular rhythm and intact distal pulses.   Murmur heard. Pulmonary/Chest: Effort normal and breath sounds normal. No respiratory distress.  Abdominal: Soft. There is no tenderness. There is no rebound and no guarding.  Genitourinary:  Reducible left inguinal hernia.   Musculoskeletal: Normal range of motion. He exhibits no edema or tenderness.   FROM without pain.  No C spine tenderness.   Neurological: He is alert. He exhibits normal muscle tone. Coordination normal.  Oriented x2, answers some questions, moving all extremities equally.   Skin: Skin is warm.  Nursing note and vitals reviewed.   ED Course  Procedures (including critical care time) DIAGNOSTIC STUDIES: Oxygen Saturation is 96% on ra, nl by my interpretation.    COORDINATION OF CARE: 12:28 PM: Discussed treatment plan which includes imaging, labs, EKG, meds with pt's daughter at bedside; she verbalizes understanding and agrees with treatment plan.  Labs Review Labs Reviewed  BASIC METABOLIC PANEL - Abnormal; Notable for the following:    Chloride 100 (*)    Glucose, Bld 370 (*)    BUN 31 (*)    GFR calc non Af Amer 56 (*)    All other components within normal limits  CBC WITH DIFFERENTIAL/PLATELET - Abnormal; Notable for the following:    HCT 38.4 (*)    All other components within normal limits  URINALYSIS, ROUTINE W REFLEX MICROSCOPIC (NOT AT [facility]) - Abnormal; Notable for the  following:    Glucose, UA 500 (*)    Hgb urine dipstick TRACE (*)    All other components within normal limits  URINE MICROSCOPIC-ADD ON - Abnormal; Notable for the following:    Squamous Epithelial / LPF 0-5 (*)    Bacteria, UA FEW (*)    All other components within normal limits  GLUCOSE, CAPILLARY - Abnormal; Notable for the following:    Glucose-Capillary 270 (*)    All other components within normal limits  RAPID STREP SCREEN (NOT AT [facility])  CULTURE, GROUP A STREP  TROPONIN I  CBG MONITORING, ED    Imaging Review Dg Chest 2 View  04/12/2015  CLINICAL DATA:  Chest pain, sore throat EXAM: CHEST  2 VIEW COMPARISON:  02/17/2015 FINDINGS: The heart size and mediastinal contours are within normal limits. Both lungs are clear. The visualized skeletal structures are unremarkable. IMPRESSION: No active  cardiopulmonary disease. Electronically Signed   By: Charlett Nose M.D.   On: 04/12/2015 13:08   Ct Head Wo Contrast  04/12/2015  CLINICAL DATA:  Altered mental status. EXAM: CT HEAD WITHOUT CONTRAST TECHNIQUE: Contiguous axial images were obtained from the base of the skull through the vertex without intravenous contrast. COMPARISON:  01/01/2015 MR brain.  09/17/2014 CT head. FINDINGS: No evidence for acute infarction, hemorrhage, mass lesion, or extra-axial fluid. Generalized atrophy. Chronic microvascular ischemic change of a mild to moderate nature. Vascular calcification. Calvarium intact. No sinus or mastoid disease. IMPRESSION: Atrophy and small vessel disease. No acute intracranial findings. Similar appearance to priors. Electronically Signed   By: Elsie Stain M.D.   On: 04/12/2015 13:28   I have personally reviewed and evaluated these images and lab results as part of my medical decision-making.   EKG Interpretation   Date/Time:  Tuesday April 12 2015 11:40:15 EST Ventricular Rate:  54 PR Interval:  184 QRS Duration: 108 QT Interval:  441 QTC Calculation: 418 R Axis:   54 Text  Interpretation:  Sinus rhythm RSR' in V1 or V2, right VCD or RVH No  significant change was found Confirmed by Manus Gunning  MD, Massie Cogliano [phone]) on  04/12/2015 12:03:50 PM      MDM   Final diagnoses:  Altered mental status, unspecified altered mental status type   daughter reports patient is not acting right since yesterday. Complaining of sore throat, not eating and drinking. Seems more confused than baseline.   urinalysis negative for infection. Chest x-ray negative. CT head negative.   Mental status improving in the ED per daughter the patient is not yet back to baseline. Still has persistent confusion which may be due to his ongoing URI and recent Sudafed use.  Labs show  Hyperglycemia without DKA.   As he is not yet back to baseline we'll observe overnight. Discussed with Dr. York Spaniel.   I personally performed the services described in this documentation, which was scribed in my presence. The recorded information has been reviewed and is accurate.   Glynn Octave, MD 04/12/15 [phone]

## 2015-04-12 NOTE — ED Provider Notes (Signed)
MSE was initiated and I personally evaluated the patient and placed orders (if any) at  11:23 AM on April 12, 2015.  The patient appears stable so that the remainder of the MSE may be completed by another provider.  Patient presents at daughters insistence this am after going to his home and finding his home in disarray with him having complaints of "not feeling well" without specific complaints.  However, daughters state he kept tapping his chest and commented on several occasions,  It feels like its coming up. He has had no nausea, vomiting, known fevers.  He reported at triage a sore throat but now denies any sx.    Objective:  Lungs ctab,  Htn, loud pan diastolic murmer c/w aortic stenosis diagnosis.  Not cooperative for exam.    Will institute cp labs/imaging,  Will need acute side work up.  Burgess AmorJulie Adlyn Fife, PA-C 04/12/15 1129  Lavera Guiseana Duo Liu, MD 04/12/15 [id number]

## 2015-04-12 NOTE — H&P (Signed)
[facility] History and Physical  Juan Pham ZOX:[id number] DOB: 02-26-1931 DOA: 04/12/2015  Referring physician:  PCP: Cassell Smiles., MD  Specialists:   Chief Complaint: worsening confusion   HPI: Juan Pham is a 82 y.o. male with PMH of HTN, DM, Bradycardia,CHF, Aortic Stenosis, Bradycardia, Dementia brought by her daughter due to worsening confusion. He lives alone, and noted by his daughter to be more confused than usual. He denies acute chest pains, no SOB but recently had cold/URI symptoms. She gave him decongestants. He denies fever, no cough. He was previously taking norco for chronic back pains. Recently, it was changed to oxycodone. He also had recent fall at home without significant injury. He denies any focal weakness. He is confused. Per daughter, patient started rapidly improve in ED. But remains confused close to baseline  -ED. Called hospitalist for overnight observation for worsening confusion, uncontrolled BP   Review of Systems: The patient denies anorexia, fever, weight loss,, vision loss, decreased hearing, hoarseness, chest pain, syncope, dyspnea on exertion, peripheral edema, balance deficits, hemoptysis, abdominal pain, melena, hematochezia, severe indigestion/heartburn, hematuria, incontinence, genital sores, muscle weakness, suspicious skin lesions, transient blindness, difficulty walking, depression, unusual weight change, abnormal bleeding, enlarged lymph nodes, angioedema, and breast masses.    Past Medical History  Diagnosis Date  . Hypertension   . Hyperlipidemia   . Bradycardia   . Diabetes mellitus without complication (HCC)     type II  . Aortic stenosis   . Diastolic dysfunction     grade 1  . Dementia   . Atrial fibrillation (HCC)   . 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 Cateract    . Retna    . Cardiac catheterization N/A 12/22/2014    Procedure: Right/Left Heart  Cath and Coronary Angiography;  Surgeon: Lennette Bihari, MD;  Location: [facility] INVASIVE CV LAB;  Service: Cardiovascular;  Laterality: N/A;   Social History:  reports that he quit smoking about 32 years ago. His smoking use included Pipe. He has never used smokeless tobacco. He reports that he does not drink alcohol or use illicit drugs. Home;  where does patient live--home, ALF, SNF? and with whom if at home? No;  Can patient participate in ADLs?  No Known Allergies  Family History  Problem Relation Age of Onset  . Heart disease Mother   . Dementia Neg Hx     (be sure to complete)  Prior to Admission medications   Medication Sig Start Date End Date Taking? Authorizing Provider  amLODipine (NORVASC) 10 MG tablet Take 10 mg by mouth daily.   Yes Historical Provider, MD  aspirin 81 MG chewable tablet Chew by mouth daily.   Yes Historical Provider, MD  carvedilol (COREG) 12.5 MG tablet Take 0.5 tablets (6.25 mg total) by mouth 2 (two) times daily with a meal. 1/2 tablet 02/01/14  Yes Lennette Bihari, MD  donepezil (ARICEPT) 5 MG tablet Take 5 mg by mouth daily. 11/23/14  Yes Historical Provider, MD  fenofibrate micronized (LOFIBRA) 134 MG capsule TAKE 1 BY MOUTH DAILY 03/22/14  Yes Lennette Bihari, MD  hydrochlorothiazide (MICROZIDE) 12.5 MG capsule Take 1 capsule (12.5 mg total) by mouth daily. 12/16/14  Yes Lennette Bihari, MD  HYDROcodone-acetaminophen (NORCO) 10-325 MG tablet Take 1 tablet by mouth every 6 (six) hours as needed. 01/04/15  Yes Historical Provider, MD  insulin NPH-regular Human (NOVOLIN 70/30) (70-30) 100 UNIT/ML injection Take 12 units 2 times  daily. If after 3 days, his blood sugar is above 200 consistently, increase the units by 2 two times daily for 5 days; if his blood sugar is still above 200, increase the units by 2 again and continue for 5 more days. Continue this regimen until his blood sugars are ranging between 120 and 160. 02/18/15  Yes Elliot Cousinenise Fisher, MD  memantine (NAMENDA)  5 MG tablet Take 1 tablet by mouth daily. 01/04/15  Yes Historical Provider, MD  simvastatin (ZOCOR) 20 MG tablet Take 20 mg by mouth daily.   Yes Historical Provider, MD  tamsulosin (FLOMAX) 0.4 MG CAPS capsule Take 1 capsule by mouth daily. 12/28/14  Yes Historical Provider, MD   Physical Exam: Filed Vitals:   04/12/15 1330 04/12/15 1400  BP: 162/63 190/38  Pulse: 53 59  Temp:    Resp: 14 14     General:  Alert,. Confused   Eyes: eom-i, perrla   ENT: no oral ulcers   Neck: supple, no JVD  Cardiovascular: s1,s2 systolic ejection murmur   Respiratory: CTA BL  Abdomen: soft, nt,nd   Skin: few ecchymosis   Musculoskeletal: no leg edema   Psychiatric: confused. No hallucibnations    Neurologic: CN 2-12 intact. Motor 5/5 BL symmetric. Alert, oriented to person, Confused  Labs on Admission:  Basic Metabolic Panel:  Recent Labs Lab 04/12/15 1212  NA 136  K 4.6  CL 100*  CO2 29  GLUCOSE 370*  BUN 31*  CREATININE 1.18  CALCIUM 9.6   Liver Function Tests: No results for input(s): AST, ALT, ALKPHOS, BILITOT, PROT, ALBUMIN in the last 168 hours. No results for input(s): LIPASE, AMYLASE in the last 168 hours. No results for input(s): AMMONIA in the last 168 hours. CBC:  Recent Labs Lab 04/12/15 1212  WBC 7.6  NEUTROABS 4.9  HGB 13.0  HCT 38.4*  MCV 88.9  PLT 302   Cardiac Enzymes:  Recent Labs Lab 04/12/15 1212  TROPONINI <0.03    BNP (last 3 results) No results for input(s): BNP in the last 8760 hours.  ProBNP (last 3 results) No results for input(s): PROBNP in the last 8760 hours.  CBG: No results for input(s): GLUCAP in the last 168 hours.  Radiological Exams on Admission: Dg Chest 2 View  04/12/2015  CLINICAL DATA:  Chest pain, sore throat EXAM: CHEST  2 VIEW COMPARISON:  02/17/2015 FINDINGS: The heart size and mediastinal contours are within normal limits. Both lungs are clear. The visualized skeletal structures are unremarkable. IMPRESSION:  No active cardiopulmonary disease. Electronically Signed   By: Charlett NoseKevin  Dover M.D.   On: 04/12/2015 13:08   Ct Head Wo Contrast  04/12/2015  CLINICAL DATA:  Altered mental status. EXAM: CT HEAD WITHOUT CONTRAST TECHNIQUE: Contiguous axial images were obtained from the base of the skull through the vertex without intravenous contrast. COMPARISON:  01/01/2015 MR brain.  09/17/2014 CT head. FINDINGS: No evidence for acute infarction, hemorrhage, mass lesion, or extra-axial fluid. Generalized atrophy. Chronic microvascular ischemic change of a mild to moderate nature. Vascular calcification. Calvarium intact. No sinus or mastoid disease. IMPRESSION: Atrophy and small vessel disease. No acute intracranial findings. Similar appearance to priors. Electronically Signed   By: Elsie StainJohn T Curnes M.D.   On: 04/12/2015 13:28    EKG: Independently reviewed.   Assessment/Plan Active Problems:   Altered mental status  82 y.o. male with PMH of HTN, DM, Bradycardia, CHF, Aortic Stenosis, Bradycardia, Dementia brought by her daughter due to worsening confusion.  1. Acute on chronic  encephalopathy/dementia. Worsening mental status probably due to medications (opioids+sudafed/allergy). No clear s/s of systemic infection. UA/CXR: unremarkable, no leukocytosis. neuro exam is non focal except confusion. CT head: brain atrophy.  -we will observe overnight, hold opioids, decongestants. Fall precautions, obtain PT/OT. Patient also looks dehyrdated. Will cont gentle   2. DM. Uncontrolled. suspect non adherence. Unclear if patient taking meds at home. He has dementia, lives alone. Last ha1c-9.2 -resume home insulin 70/30 12 U BID. +ISS. Titrate as needed 3. CHF, Aortic Stenosis. Clinically euvolemic. Pt is not on diuretics. BB was discounted due to bradycardia. Pt may not be a good candidate for AVR due to dementia, and ongoing FTT, falls   4. Bradycardia. Thought due to BB, which was discontinue on last admission. unclear if  patient continued taking BB at home. -hold BB, will also hold aricept  5. HTN. Uncontrolled in ED. Suspected non adherence.  -resume home amlodipine, prn hydralazine.     D/w patient, confirmed with his Daughter. Patient is DNR   None;  if consultant consulted, please document name and whether formally or informally consulted  Code Status: DNR (must indicate code status--if unknown or must be presumed, indicate so) Family Communication: d/w patient, his daughter  (indicate person spoken with, if applicable, with phone number if by telephone) Disposition Plan: home 24-48 hrs  (indicate anticipated LOS)  Time spent: >45 minutes   Esperanza Sheets [facility] Pager [phone]  If 7PM-7AM, please contact night-coverage [email or web address] Password [email or web address] 04/12/2015, 3:15 PM

## 2015-04-13 DIAGNOSIS — R001 Bradycardia, unspecified: Secondary | ICD-10-CM

## 2015-04-13 DIAGNOSIS — I350 Nonrheumatic aortic (valve) stenosis: Secondary | ICD-10-CM

## 2015-04-13 DIAGNOSIS — R410 Disorientation, unspecified: Secondary | ICD-10-CM | POA: Diagnosis not present

## 2015-04-13 DIAGNOSIS — E119 Type 2 diabetes mellitus without complications: Secondary | ICD-10-CM | POA: Diagnosis not present

## 2015-04-13 LAB — GLUCOSE, CAPILLARY
Glucose-Capillary: 188 mg/dL — ABNORMAL HIGH (ref 65–99)
Glucose-Capillary: 141 mg/dL — ABNORMAL HIGH (ref 65–99)

## 2015-04-13 MED ORDER — ENOXAPARIN SODIUM 40 MG/0.4ML ~~LOC~~ SOLN: 40.0000 mg | SUBCUTANEOUS | Status: DC

## 2015-04-13 NOTE — Progress Notes (Signed)
[facility] PROGRESS NOTE   JARL SELLITTO ZOX:[id number] DOB: April 23, 1930 DOA: 04/12/2015 PCP: Cassell Smiles., MD  HPI/Subjective: Patient is awake, alert and oriented to self and place not to time and date.  Assessment/Plan: Principal Problem:   Confusion Active Problems:   Diabetes mellitus without complication (HCC)   Aortic stenosis, severe   Bradycardia   Altered mental status    Acute on chronic encephalopathy/dementia. Worsening mental status probably due to medications (opioids+sudafed/allergy). No clear s/s of systemic infection. UA/CXR: unremarkable, no leukocytosis. neuro exam is non focal except confusion. CT head: brain atrophy.  Await PT/OT evaluation, might need home health service.   DM. Uncontrolled. suspect non adherence. Unclear if patient taking meds at home. He has dementia, lives alone. Last ha1c-9.2 -resume home insulin 70/30 12 U BID. +ISS. Titrate as needed  CHF, Aortic Stenosis. Clinically euvolemic. Pt is not on diuretics. BB was discounted due to bradycardia. Pt may not be a good candidate for AVR due to dementia, and ongoing FTT, falls   Bradycardia. Thought due to BB, which was discontinue on last admission. unclear if patient continued taking BB at home. -hold BB, will also hold aricept   HTN. Uncontrolled in ED. Suspected non adherence.  -resume home amlodipine, prn hydralazine.   Confirmed with his Daughter. Patient is DNR  Code Status: DNR Family Communication: Plan discussed with the patient. Disposition Plan: Remains inpatient Diet: Diet heart healthy/carb modified Room service appropriate?: Yes; Fluid consistency:: Thin  Consultants:  None  Procedures:  None  Antibiotics:  None   Objective: Filed Vitals:   04/12/15 2112 04/13/15 0505  BP: 163/46 155/41  Pulse: 84 62  Temp: 98.4 F (36.9 C) 98.3 F (36.8 C)  Resp:  14    Intake/Output Summary (Last 24 hours) at 04/13/15 1102 Last data filed at  04/13/15 0930  Gross per 24 hour  Intake    240 ml  Output    800 ml  Net   -560 ml   Filed Weights   04/12/15 0925 04/12/15 1540  Weight: 66.225 kg (146 lb) 63.912 kg (140 lb 14.4 oz)    Exam: General: Alert and awake, oriented x2, not in any acute distress. HEENT: anicteric sclera, pupils reactive to light and accommodation, EOMI CVS: S1-S2 clear, no murmur rubs or gallops Chest: clear to auscultation bilaterally, no wheezing, rales or rhonchi Abdomen: soft nontender, nondistended, normal bowel sounds, no organomegaly Extremities: no cyanosis, clubbing or edema noted bilaterally Neuro: Cranial nerves II-XII intact, no focal neurological deficits  Data Reviewed: Basic Metabolic Panel:  Recent Labs Lab 04/12/15 1212  NA 136  K 4.6  CL 100*  CO2 29  GLUCOSE 370*  BUN 31*  CREATININE 1.18  CALCIUM 9.6   Liver Function Tests: No results for input(s): AST, ALT, ALKPHOS, BILITOT, PROT, ALBUMIN in the last 168 hours. No results for input(s): LIPASE, AMYLASE in the last 168 hours. No results for input(s): AMMONIA in the last 168 hours. CBC:  Recent Labs Lab 04/12/15 1212  WBC 7.6  NEUTROABS 4.9  HGB 13.0  HCT 38.4*  MCV 88.9  PLT 302   Cardiac Enzymes:  Recent Labs Lab 04/12/15 1212  TROPONINI <0.03   BNP (last 3 results) No results for input(s): BNP in the last 8760 hours.  ProBNP (last 3 results) No results for input(s): PROBNP in the last 8760 hours.  CBG:  Recent Labs Lab 04/12/15 1607 04/12/15 2146 04/13/15 0800  GLUCAP 270* 266* 188*    Micro Recent Results (  from the past 240 hour(s))  Rapid strep screen     Status: None   Collection Time: 04/12/15 12:37 PM  Result Value Ref Range Status   Streptococcus, Group A Screen (Direct) NEGATIVE NEGATIVE Final    Comment: (NOTE) A Rapid Antigen test may result negative if the antigen level in the sample is below the detection level of this test. The FDA has not cleared this test as a  stand-alone test therefore the rapid antigen negative result has reflexed to a Group A Strep culture.      Studies: Dg Chest 2 View  04/12/2015  CLINICAL DATA:  Chest pain, sore throat EXAM: CHEST  2 VIEW COMPARISON:  02/17/2015 FINDINGS: The heart size and mediastinal contours are within normal limits. Both lungs are clear. The visualized skeletal structures are unremarkable. IMPRESSION: No active cardiopulmonary disease. Electronically Signed   By: Charlett NoseKevin  Dover M.D.   On: 04/12/2015 13:08   Ct Head Wo Contrast  04/12/2015  CLINICAL DATA:  Altered mental status. EXAM: CT HEAD WITHOUT CONTRAST TECHNIQUE: Contiguous axial images were obtained from the base of the skull through the vertex without intravenous contrast. COMPARISON:  01/01/2015 MR brain.  09/17/2014 CT head. FINDINGS: No evidence for acute infarction, hemorrhage, mass lesion, or extra-axial fluid. Generalized atrophy. Chronic microvascular ischemic change of a mild to moderate nature. Vascular calcification. Calvarium intact. No sinus or mastoid disease. IMPRESSION: Atrophy and small vessel disease. No acute intracranial findings. Similar appearance to priors. Electronically Signed   By: Elsie StainJohn T Curnes M.D.   On: 04/12/2015 13:28    Scheduled Meds: . amLODipine  10 mg Oral Daily  . aspirin  81 mg Oral Daily  . enoxaparin (LOVENOX) injection  30 mg Subcutaneous Q24H  . insulin aspart  0-9 Units Subcutaneous TID WC  . insulin aspart protamine- aspart  12 Units Subcutaneous BID WC  . simvastatin  20 mg Oral Daily  . sodium chloride  3 mL Intravenous Q12H  . tamsulosin  0.4 mg Oral Daily   Continuous Infusions:      Time spent: 35 minutes    Our Lady Of Fat[facility] HospitalELMAHI,Ragan Duhon A  [facility] Pager [phone] If 7PM-7AM, please contact night-coverage at [email or web address], password [email or web address] 04/13/2015, 11:02 AM

## 2015-04-13 NOTE — Care Management Obs Status (Signed)
MEDICARE OBSERVATION STATUS NOTIFICATION   Patient Details  Name: Juan Pham MRN: [id number] Date of Birth: [birth year]   Medicare Observation Status Notification Given:  Yes    Malcolm MetroChildress, Aman Batley Demske, RN 04/13/2015, 3:31 PM

## 2015-04-13 NOTE — Discharge Summary (Signed)
Physician Discharge Summary  Juan LunchRalph C Lumpkin UJW:[id number] DOB: [birth year] DOA: 04/12/2015  PCP: Cassell SmilesFUSCO,LAWRENCE J., MD  Admit date: 04/12/2015 Discharge date: 04/13/2015  Time spent: 40 minutes  Recommendations for Outpatient Follow-up:  1. Follow-up with primary care physician within one week.   Discharge Diagnoses:  Principal Problem:   Confusion Active Problems:   Diabetes mellitus without complication (HCC)   Aortic stenosis, severe   Bradycardia   Altered mental status   Discharge Condition: Stable  Diet recommendation: Heart healthy/carbohydrate modified diet  Filed Weights   04/12/15 0925 04/12/15 1540  Weight: 66.225 kg (146 lb) 63.912 kg (140 lb 14.4 oz)    History of present illness:  Juan Pham is a 82 y.o. male with PMH of HTN, DM, Bradycardia,CHF, Aortic Stenosis, Bradycardia, Dementia brought by her daughter due to worsening confusion. He lives alone, and noted by his daughter to be more confused than usual. He denies acute chest pains, no SOB but recently had cold/URI symptoms. She gave him decongestants. He denies fever, no cough. He was previously taking norco for chronic back pains. Recently, it was changed to oxycodone. He also had recent fall at home without significant injury. He denies any focal weakness. He is confused. Per daughter, patient started rapidly improve in ED. But remains confused close to baseline  -ED. Called hospitalist for overnight observation for worsening confusion, uncontrolled BP   Hospital Course:   Acute on chronic encephalopathy/dementia. Worsening mental status probably due to medications (opioids+sudafed/allergy). No clear s/s of systemic infection. UA/CXR: unremarkable, no leukocytosis. neuro exam is non focal except confusion. CT head: brain atrophy.  PT/OT evaluated the patient and recommended home with home health services. I discussed with the daughter at bedside, he is back to his baseline and she wants to take him  home.  DM. Uncontrolled. suspect non adherence. Unclear if patient taking meds at home. He has dementia, lives alone. Last ha1c-9.2 -resume home insulin 70/30 on discharge.  CHF, Aortic Stenosis. Clinically euvolemic. Pt is not on diuretics. BB was discounted due to bradycardia. Pt may not be a good candidate for AVR due to dementia, and ongoing FTT, falls avoid hypovolemia  Bradycardia. Thought due to BB, which was discontinue on last admission. unclear if patient continued taking BB at home. -hold BB, will also hold aricept   HTN. Uncontrolled in ED. Suspected non adherence.  -Resume home amlodipine, prn hydralazine.    Procedures:  None  Consultations:  None  Discharge Exam: Filed Vitals:   04/13/15 0505 04/13/15 1306  BP: 155/41   Pulse: 62 83  Temp: 98.3 F (36.8 C)   Resp: 14    General: Alert and awake, oriented x2, not in any acute distress. HEENT: anicteric sclera, pupils reactive to light and accommodation, EOMI CVS: S1-S2 clear, no murmur rubs or gallops Chest: clear to auscultation bilaterally, no wheezing, rales or rhonchi Abdomen: soft nontender, nondistended, normal bowel sounds, no organomegaly Extremities: no cyanosis, clubbing or edema noted bilaterally Neuro: Cranial nerves II-XII intact, no focal neurological deficits  Discharge Instructions   Discharge Instructions    Diet - low sodium heart healthy    Complete by:  As directed      Increase activity slowly    Complete by:  As directed           Current Discharge Medication List    CONTINUE these medications which have NOT CHANGED   Details  amLODipine (NORVASC) 10 MG tablet Take 10 mg by mouth daily.  aspirin 81 MG chewable tablet Chew by mouth daily.    carvedilol (COREG) 12.5 MG tablet Take 0.5 tablets (6.25 mg total) by mouth 2 (two) times daily with a meal. 1/2 tablet Qty: 90 tablet, Refills: 3    donepezil (ARICEPT) 5 MG tablet Take 5 mg by mouth daily.    fenofibrate  micronized (LOFIBRA) 134 MG capsule TAKE 1 BY MOUTH DAILY Qty: 90 capsule, Refills: 2    hydrochlorothiazide (MICROZIDE) 12.5 MG capsule Take 1 capsule (12.5 mg total) by mouth daily. Qty: 90 capsule, Refills: 3    HYDROcodone-acetaminophen (NORCO) 10-325 MG tablet Take 1 tablet by mouth every 6 (six) hours as needed.    insulin NPH-regular Human (NOVOLIN 70/30) (70-30) 100 UNIT/ML injection Take 12 units 2 times daily. If after 3 days, his blood sugar is above 200 consistently, increase the units by 2 two times daily for 5 days; if his blood sugar is still above 200, increase the units by 2 again and continue for 5 more days. Continue this regimen until his blood sugars are ranging between 120 and 160. Qty: 10 mL, Refills: 11    memantine (NAMENDA) 5 MG tablet Take 1 tablet by mouth daily.    simvastatin (ZOCOR) 20 MG tablet Take 20 mg by mouth daily.    tamsulosin (FLOMAX) 0.4 MG CAPS capsule Take 1 capsule by mouth daily.       No Known Allergies Follow-up Information    Follow up with Cassell Smiles., MD In 1 week.   Specialty:  Internal Medicine   Contact information:   [address] [place] [address] [phone]        The results of significant diagnostics from this hospitalization (including imaging, microbiology, ancillary and laboratory) are listed below for reference.    Significant Diagnostic Studies: Dg Chest 2 View  04/12/2015  CLINICAL DATA:  Chest pain, sore throat EXAM: CHEST  2 VIEW COMPARISON:  02/17/2015 FINDINGS: The heart size and mediastinal contours are within normal limits. Both lungs are clear. The visualized skeletal structures are unremarkable. IMPRESSION: No active cardiopulmonary disease. Electronically Signed   By: Charlett Nose M.D.   On: 04/12/2015 13:08   Ct Head Wo Contrast  04/12/2015  CLINICAL DATA:  Altered mental status. EXAM: CT HEAD WITHOUT CONTRAST TECHNIQUE: Contiguous axial images were obtained from the base of the skull  through the vertex without intravenous contrast. COMPARISON:  01/01/2015 MR brain.  09/17/2014 CT head. FINDINGS: No evidence for acute infarction, hemorrhage, mass lesion, or extra-axial fluid. Generalized atrophy. Chronic microvascular ischemic change of a mild to moderate nature. Vascular calcification. Calvarium intact. No sinus or mastoid disease. IMPRESSION: Atrophy and small vessel disease. No acute intracranial findings. Similar appearance to priors. Electronically Signed   By: Elsie Stain M.D.   On: 04/12/2015 13:28    Microbiology: Recent Results (from the past 240 hour(s))  Rapid strep screen     Status: None   Collection Time: 04/12/15 12:37 PM  Result Value Ref Range Status   Streptococcus, Group A Screen (Direct) NEGATIVE NEGATIVE Final    Comment: (NOTE) A Rapid Antigen test may result negative if the antigen level in the sample is below the detection level of this test. The FDA has not cleared this test as a stand-alone test therefore the rapid antigen negative result has reflexed to a Group A Strep culture.      Labs: Basic Metabolic Panel:  Recent Labs Lab 04/12/15 1212  NA 136  K 4.6  CL 100*  CO2 29  GLUCOSE 370*  BUN 31*  CREATININE 1.18  CALCIUM 9.6   Liver Function Tests: No results for input(s): AST, ALT, ALKPHOS, BILITOT, PROT, ALBUMIN in the last 168 hours. No results for input(s): LIPASE, AMYLASE in the last 168 hours. No results for input(s): AMMONIA in the last 168 hours. CBC:  Recent Labs Lab 04/12/15 1212  WBC 7.6  NEUTROABS 4.9  HGB 13.0  HCT 38.4*  MCV 88.9  PLT 302   Cardiac Enzymes:  Recent Labs Lab 04/12/15 1212  TROPONINI <0.03   BNP: BNP (last 3 results) No results for input(s): BNP in the last 8760 hours.  ProBNP (last 3 results) No results for input(s): PROBNP in the last 8760 hours.  CBG:  Recent Labs Lab 04/12/15 1607 04/12/15 2146 04/13/15 0800 04/13/15 1151  GLUCAP 270* 266* 188* 141*        Signed:  Kaylanie Capili A MD  [facility] 04/13/2015, 1:51 PM

## 2015-04-13 NOTE — Evaluation (Signed)
Physical Therapy Evaluation Patient Details Name: Juan Pham MRN: [id number] DOB: 1930/11/24 Today's Date: 04/13/2015   History of Present Illness  82yo white male with PMH: HTN, DM, CHF, bradycardia (thought to be medically mediated), and dementia. Pt comes to [facility] after daughter noticed worsening confusion. CXR is negative for acute lung disease; head CT is negative for any acute abnormality.Yesterday glucose was elevated to 370. Admitting physician is r/o polypharmacy as cause of AMS.    Clinical Impression  Pt is pleasantly confused and willing to participate upon entry. Initially very unstable upon standing, pt gradually improves stability as he walks with supervision. Multiple LOB during session requiring assistance to correct. Pt with cognitive deficits that would put safety and self care at risk. No caregiver available to determine baseline cognition. Recommending DC to home with 24/7 supervision, and HHPT services, once medically appropriate for DC. No additional PT needs here.     Follow Up Recommendations Home health PT;Supervision/Assistance - 24 hour;Supervision for mobility/OOB    Equipment Recommendations  Rolling walker with 5" wheels    Recommendations for Other Services       Precautions / Restrictions Precautions Precautions:  (no score available at eval. ) Restrictions Weight Bearing Restrictions: No Other Position/Activity Restrictions: "up with assistance"       Mobility  Bed Mobility Overal bed mobility: Modified Independent                Transfers Overall transfer level: Needs assistance Equipment used: Rolling walker (2 wheeled) Transfers: Sit to/from Stand Sit to Stand: Mod assist         General transfer comment: hip extensor weakness bilat; very weak upon standing , multiple LOB laterally to the R.   Ambulation/Gait Ambulation/Gait assistance: Min assist Ambulation Distance (Feet): 430 Feet Assistive device: Rolling walker (2  wheeled) Gait Pattern/deviations: Decreased stride length;Scissoring;Decreased weight shift to left;Narrow base of support;Trunk flexed Gait velocity: 0.60ms, not appropriate for Household AMB.    General Gait Details: Requires assistance with RW during turns.   Stairs            Wheelchair Mobility    Modified Rankin (Stroke Patients Only)       Balance Overall balance assessment: Needs assistance (multiple LOB during session, requiring minA to correct. )                                           Pertinent Vitals/Pain Pain Assessment: No/denies pain (reports some chronic LBP which is well controlled at the moment. )    Home Living Family/patient expects to be discharged to:: Private residence Living Arrangements: Alone               Additional Comments: Pt is very confused at evaluation, unable to acquire history.     Prior Function                 Hand Dominance   Dominant Hand: Right    Extremity/Trunk Assessment   Upper Extremity Assessment: Overall WFL for tasks assessed           Lower Extremity Assessment: Generalized weakness (requires modA to come to standing, uses quads dominance with calves against chair; significant hip extensor weakness. )      Cervical / Trunk Assessment: Kyphotic  Communication      Cognition Arousal/Alertness:  (drowsy) Behavior [facility] assessed/performed Overall Cognitive  Status: No family/caregiver present to determine baseline cognitive functioning (Pt is oriented to self only. He is very disoriented to time, and believes we are at his house right now. Pt is unable to correctly say what number to dial if his house were on fire, but is able to correctly identify the call bell button when asked. ) Area of Impairment: Orientation;Problem solving;Memory Orientation Level: Disoriented to;Place;Time;Situation                  General Comments      Exercises         Assessment/Plan    PT Assessment All further PT needs can be met in the next venue of care  PT Diagnosis Abnormality of gait;Altered mental status   PT Problem List Decreased strength;Decreased knowledge of use of DME;Decreased range of motion;Decreased safety awareness;Decreased knowledge of precautions;Decreased balance;Decreased cognition  PT Treatment Interventions     PT Goals (Current goals can be found in the Care Plan section) Acute Rehab PT Goals PT Goal Formulation: All assessment and education complete, DC therapy    Frequency     Barriers to discharge        Co-evaluation               End of Session Equipment Utilized During Treatment: Gait belt Activity Tolerance: Patient tolerated treatment well;No increased pain Patient left: in bed;with call bell/phone within reach;with bed alarm set Nurse Communication: Other (comment)    Functional Limitation: Changing and maintaining body position Changing and Maintaining Body Position Current Status (L9747): At least 40 percent but less than 60 percent impaired, limited or restricted Changing and Maintaining Body Position Goal Status (V8550): At least 40 percent but less than 60 percent impaired, limited or restricted Changing and Maintaining Body Position Discharge Status [phone]): At least 40 percent but less than 60 percent impaired, limited or restricted    Time: 1002-1023 PT Time Calculation (min) (ACUTE ONLY): 21 min   Charges:   PT Evaluation $PT Eval Moderate Complexity: 1 Procedure PT Treatments $Therapeutic Activity: 8-22 mins   PT G Codes:   PT G-Codes **NOT FOR INPATIENT CLASS** Functional Limitation: Changing and maintaining body position Changing and Maintaining Body Position Current Status (Y5749): At least 40 percent but less than 60 percent impaired, limited or restricted Changing and Maintaining Body Position Goal Status (T5521): At least 40 percent but less than 60 percent impaired,  limited or restricted Changing and Maintaining Body Position Discharge Status [phone]): At least 40 percent but less than 60 percent impaired, limited or restricted    Buccola,Allan C 04/13/2015, 1:17 PM  1:20 PM  Juan Pham, PT, DPT [place] License # [id number]

## 2015-04-13 NOTE — Care Management Note (Signed)
Case Management Note  Patient Details  Name: Juan LunchRalph C Dartt MRN: [id number] Date of Birth: [birth year]  Subjective/Objective:                  Pt is from home, lives alone with superivision from children. Pt has cane and walker at home to use as needed. PT has recommended HH PT and RW. Pt's daughter has refused HH [facility] Pt discharging home today with 24/7 supervision.   Action/Plan: No CM needs.   Expected Discharge Date:  04/13/15               Expected Discharge Plan:  Home/Self Care  In-House Referral:  NA  Discharge planning Services  CM Consult  Post Acute Care Choice:  NA Choice offered to:  NA  DME Arranged:    DME Agency:     HH Arranged:    HH Agency:     Status of Service:  Completed, signed off  Medicare Important Message Given:    Date Medicare IM Given:    Medicare IM give by:    Date Additional Medicare IM Given:    Additional Medicare Important Message give by:     If discussed at Long Length of Stay Meetings, dates discussed:    Additional Comments:  Malcolm MetroChildress, Starlynn Klinkner Demske, RN 04/13/2015, 3:29 PM

## 2015-04-13 NOTE — Progress Notes (Signed)
Patient's daughter states understanding of discharge instructions 

## 2015-04-13 NOTE — Evaluation (Signed)
Occupational Therapy Evaluation Patient Details Name: Juan Pham MRN: [id number] DOB: Jul 20, 1930 Today's Date: 04/13/2015    History of Present Illness Juan Pham is a 82 y.o. male with PMH of HTN, DM, Bradycardia,CHF, Aortic Stenosis, Bradycardia, Dementia brought by her daughter due to worsening confusion. He lives alone, and noted by his daughter to be more confused than usual. He denies acute chest pains, no SOB but recently had cold/URI symptoms. She gave him decongestants. He denies fever, no cough. He was previously taking norco for chronic back pains. Recently, it was changed to oxycodone. He also had recent fall at home without significant injury. He denies any focal weakness. He is confused. Per daughter, patient started rapidly improve in ED. But remains confused close to baseline    Clinical Impression   Pt awake, alert, oriented x2 (person and place) this am, agreeable to evaluation. Pt demonstrates Mod I in bed mobility and seated ADL tasks; BUE range of motion and strength WFL. Pt required supervision-min guard during standing ADL tasks and functional mobility due to occasional loss of balance and difficulty with sit-stand transfer this am. Daughter was not available to confirm pt reports or provide prior level of functioning. Pt does not require any additional OT services, as he is able to complete ADL tasks, however recommend 24 hour supervision at home due to cognitive status and safety concerns.      Follow Up Recommendations  Supervision/Assistance - 24 hour    Equipment Recommendations  None recommended by OT       Precautions / Restrictions Precautions Precautions: Fall Restrictions Weight Bearing Restrictions: No      Mobility Bed Mobility Overal bed mobility: Modified Independent                Transfers Overall transfer level: Needs assistance Equipment used: Rolling walker (2 wheeled) Transfers: Sit to/from Stand Sit to Stand: Min assist;From  elevated surface                   ADL Overall ADL's : Needs assistance/impaired Eating/Feeding: Modified independent   Grooming: Wash/dry face;Set up;Sitting               Lower Body Dressing: Modified independent;Sitting/lateral leans               Functional mobility during ADLs: Minimal assistance;Rolling walker General ADL Comments: Pt required min assist during functional mobility tasks due to unsteadiness      Vision Vision Assessment?: No apparent visual deficits          Pertinent Vitals/Pain Pain Assessment: No/denies pain     Hand Dominance Right   Extremity/Trunk Assessment Upper Extremity Assessment Upper Extremity Assessment: Overall WFL for tasks assessed   Lower Extremity Assessment Lower Extremity Assessment: Defer to PT evaluation       Communication Communication Communication: Other (comment) (very soft spoken)   Cognition Arousal/Alertness: Awake/alert Behavior During Therapy: WFL for tasks assessed/performed Overall Cognitive Status: Within Functional Limits for tasks assessed                                Home Living Family/patient expects to be discharged to:: Private residence Living Arrangements: Alone Available Help at Discharge: Family               Bathroom Shower/Tub: Walk-in Human resources officer: Standard     Home Equipment: Environmental consultant - 2 wheels;Bedside commode   Additional Comments:  Pt reports his daughter assists with housekeeping tasks, medication tasks, and meal prep as needed      Prior Functioning/Environment Level of Independence: Independent             OT Diagnosis: Cognitive deficits   OT Problem List: Decreased cognition;Decreased safety awareness    End of Session Equipment Utilized During Treatment: Gait belt;Rolling walker  Activity Tolerance: Patient tolerated treatment well Patient left: in bed;with call bell/phone within reach;with bed alarm set   Time:  [id number] OT Time Calculation (min): 29 min Charges:  OT General Charges $OT Visit: 1 Procedure OT Evaluation $OT Eval Low Complexity: 1 Procedure G-Codes: OT G-codes **NOT FOR INPATIENT CLASS** Functional Assessment Tool Used: clinical judgement Functional Limitation: Self care Self Care Current Status (V4098(G8987): At least 20 percent but less than 40 percent impaired, limited or restricted Self Care Goal Status (J1914(G8988): At least 20 percent but less than 40 percent impaired, limited or restricted Self Care Discharge Status [phone]): At least 20 percent but less than 40 percent impaired, limited or restricted   Ezra SitesLeslie Donie Lemelin, OTR/L  [phone]  04/13/2015, 10:05 AM

## 2015-04-14 LAB — CULTURE, GROUP A STREP: STREP A CULTURE: NEGATIVE

## 2015-04-22 ENCOUNTER — Inpatient Hospital Stay (HOSPITAL_COMMUNITY)
Admission: EM | Admit: 2015-04-22 | Discharge: 2015-04-25 | DRG: 394 | Disposition: A | Discharge status: Home / Self Care | Payer: PPO | Attending: Internal Medicine | Admitting: Internal Medicine

## 2015-04-22 ENCOUNTER — Encounter (HOSPITAL_COMMUNITY): Payer: Self-pay | Admitting: *Deleted

## 2015-04-22 DIAGNOSIS — Z66 Do not resuscitate: Secondary | ICD-10-CM | POA: Diagnosis present

## 2015-04-22 DIAGNOSIS — K222 Esophageal obstruction: Secondary | ICD-10-CM | POA: Diagnosis not present

## 2015-04-22 DIAGNOSIS — I482 Chronic atrial fibrillation: Secondary | ICD-10-CM | POA: Diagnosis present

## 2015-04-22 DIAGNOSIS — I10 Essential (primary) hypertension: Secondary | ICD-10-CM | POA: Diagnosis present

## 2015-04-22 DIAGNOSIS — F0390 Unspecified dementia without behavioral disturbance: Secondary | ICD-10-CM | POA: Diagnosis present

## 2015-04-22 DIAGNOSIS — K644 Residual hemorrhoidal skin tags: Secondary | ICD-10-CM | POA: Diagnosis present

## 2015-04-22 DIAGNOSIS — Z8249 Family history of ischemic heart disease and other diseases of the circulatory system: Secondary | ICD-10-CM | POA: Diagnosis not present

## 2015-04-22 DIAGNOSIS — Z87891 Personal history of nicotine dependence: Secondary | ICD-10-CM | POA: Diagnosis not present

## 2015-04-22 DIAGNOSIS — I350 Nonrheumatic aortic (valve) stenosis: Secondary | ICD-10-CM | POA: Diagnosis present

## 2015-04-22 DIAGNOSIS — K2980 Duodenitis without bleeding: Secondary | ICD-10-CM | POA: Diagnosis present

## 2015-04-22 DIAGNOSIS — Z794 Long term (current) use of insulin: Secondary | ICD-10-CM | POA: Diagnosis not present

## 2015-04-22 DIAGNOSIS — K2960 Other gastritis without bleeding: Secondary | ICD-10-CM | POA: Diagnosis present

## 2015-04-22 DIAGNOSIS — E1165 Type 2 diabetes mellitus with hyperglycemia: Secondary | ICD-10-CM | POA: Diagnosis present

## 2015-04-22 DIAGNOSIS — R1310 Dysphagia, unspecified: Secondary | ICD-10-CM | POA: Diagnosis present

## 2015-04-22 DIAGNOSIS — K649 Unspecified hemorrhoids: Secondary | ICD-10-CM | POA: Diagnosis not present

## 2015-04-22 DIAGNOSIS — K2970 Gastritis, unspecified, without bleeding: Secondary | ICD-10-CM | POA: Diagnosis not present

## 2015-04-22 DIAGNOSIS — Z7982 Long term (current) use of aspirin: Secondary | ICD-10-CM | POA: Diagnosis not present

## 2015-04-22 DIAGNOSIS — E785 Hyperlipidemia, unspecified: Secondary | ICD-10-CM | POA: Diagnosis present

## 2015-04-22 DIAGNOSIS — K921 Melena: Secondary | ICD-10-CM | POA: Diagnosis present

## 2015-04-22 DIAGNOSIS — K922 Gastrointestinal hemorrhage, unspecified: Secondary | ICD-10-CM

## 2015-04-22 DIAGNOSIS — K648 Other hemorrhoids: Secondary | ICD-10-CM | POA: Diagnosis present

## 2015-04-22 DIAGNOSIS — K449 Diaphragmatic hernia without obstruction or gangrene: Secondary | ICD-10-CM | POA: Diagnosis present

## 2015-04-22 DIAGNOSIS — N390 Urinary tract infection, site not specified: Secondary | ICD-10-CM | POA: Diagnosis present

## 2015-04-22 DIAGNOSIS — IMO0001 Reserved for inherently not codable concepts without codable children: Secondary | ICD-10-CM | POA: Insufficient documentation

## 2015-04-22 DIAGNOSIS — E119 Type 2 diabetes mellitus without complications: Secondary | ICD-10-CM

## 2015-04-22 DIAGNOSIS — K2990 Gastroduodenitis, unspecified, without bleeding: Secondary | ICD-10-CM | POA: Diagnosis not present

## 2015-04-22 LAB — CBC WITH DIFFERENTIAL/PLATELET
BASOS ABS: 0.1 10*3/uL (ref 0.0–0.1)
BASOS PCT: 1 %
Eosinophils Absolute: 0.2 10*3/uL (ref 0.0–0.7)
Eosinophils Relative: 2 %
HEMATOCRIT: 38.4 % — AB (ref 39.0–52.0)
HEMOGLOBIN: 13 g/dL (ref 13.0–17.0)
LYMPHS PCT: 24 %
Lymphs Abs: 2 10*3/uL (ref 0.7–4.0)
MCH: 30.7 pg (ref 26.0–34.0)
MCHC: 33.9 g/dL (ref 30.0–36.0)
MCV: 90.6 fL (ref 78.0–100.0)
MONO ABS: 0.8 10*3/uL (ref 0.1–1.0)
Monocytes Relative: 9 %
NEUTROS ABS: 5.3 10*3/uL (ref 1.7–7.7)
NEUTROS PCT: 64 %
Platelets: 325 10*3/uL (ref 150–400)
RBC: 4.24 MIL/uL (ref 4.22–5.81)
RDW: 13.1 % (ref 11.5–15.5)
WBC: 8.3 10*3/uL (ref 4.0–10.5)

## 2015-04-22 LAB — COMPREHENSIVE METABOLIC PANEL
ALBUMIN: 4.1 g/dL (ref 3.5–5.0)
ALK PHOS: 26 U/L — AB (ref 38–126)
ALT: 15 U/L — AB (ref 17–63)
AST: 15 U/L (ref 15–41)
Anion gap: 9 (ref 5–15)
BILIRUBIN TOTAL: 0.8 mg/dL (ref 0.3–1.2)
BUN: 35 mg/dL — AB (ref 6–20)
CO2: 28 mmol/L (ref 22–32)
CREATININE: 1.24 mg/dL (ref 0.61–1.24)
Calcium: 9.5 mg/dL (ref 8.9–10.3)
Chloride: 101 mmol/L (ref 101–111)
GFR calc Af Amer: >60 mL/min (ref >60)
GFR, EST NON AFRICAN AMERICAN: 52 mL/min — AB (ref >60)
GLUCOSE: 305 mg/dL — AB (ref 65–99)
POTASSIUM: 4.2 mmol/L (ref 3.5–5.1)
Sodium: 138 mmol/L (ref 135–145)
TOTAL PROTEIN: 7 g/dL (ref 6.5–8.1)

## 2015-04-22 LAB — GLUCOSE, CAPILLARY: Glucose-Capillary: 246 mg/dL — ABNORMAL HIGH (ref 65–99)

## 2015-04-22 LAB — TYPE AND SCREEN
ABO/RH(D): A POS
ANTIBODY SCREEN: NEGATIVE

## 2015-04-22 LAB — HEMOGLOBIN: HEMOGLOBIN: 12.3 g/dL — AB (ref 13.0–17.0)

## 2015-04-22 LAB — BRAIN NATRIURETIC PEPTIDE: B NATRIURETIC PEPTIDE 5: 79 pg/mL (ref 0.0–100.0)

## 2015-04-22 LAB — PROTIME-INR
INR: 1.27 (ref 0.00–1.49)
PROTHROMBIN TIME: 16 s — AB (ref 11.6–15.2)

## 2015-04-22 LAB — ABO/RH: ABO/RH(D): A POS

## 2015-04-22 LAB — PREPARE RBC (CROSSMATCH)

## 2015-04-22 LAB — HEMATOCRIT: HCT: 35.6 % — ABNORMAL LOW (ref 39.0–52.0)

## 2015-04-22 MED ORDER — DONEPEZIL HCL 5 MG PO TABS
5.0000 mg | ORAL_TABLET | Freq: Every day | ORAL | Status: DC
  Administered 2015-04-23 – 2015-04-25 (×3): 5 mg via ORAL
  Filled 2015-04-22 (×4): qty 1

## 2015-04-22 MED ORDER — HYDROCODONE-ACETAMINOPHEN 5-325 MG PO TABS
2.0000 | ORAL_TABLET | ORAL | Status: DC | PRN
  Administered 2015-04-22 – 2015-04-24 (×6): 2 via ORAL
  Filled 2015-04-22 (×9): qty 2

## 2015-04-22 MED ORDER — HYDROCHLOROTHIAZIDE 12.5 MG PO CAPS
12.5000 mg | ORAL_CAPSULE | Freq: Every day | ORAL | Status: DC
  Administered 2015-04-23: 12.5 mg via ORAL
  Filled 2015-04-22: qty 1

## 2015-04-22 MED ORDER — ACETAMINOPHEN 325 MG PO TABS
650.0000 mg | ORAL_TABLET | Freq: Four times a day (QID) | ORAL | Status: DC | PRN
  Administered 2015-04-24: 650 mg via ORAL
  Filled 2015-04-22 (×2): qty 2

## 2015-04-22 MED ORDER — SODIUM CHLORIDE 0.9 % IJ SOLN
3.0000 mL | Freq: Two times a day (BID) | INTRAMUSCULAR | Status: DC
  Administered 2015-04-23 – 2015-04-24 (×3): 3 mL via INTRAVENOUS

## 2015-04-22 MED ORDER — POTASSIUM CHLORIDE IN NACL 20-0.9 MEQ/L-% IV SOLN
INTRAVENOUS | Status: AC
  Administered 2015-04-23: 100 mL/h via INTRAVENOUS

## 2015-04-22 MED ORDER — PANTOPRAZOLE SODIUM 40 MG IV SOLR
40.0000 mg | INTRAVENOUS | Status: DC
  Administered 2015-04-23: 40 mg via INTRAVENOUS
  Filled 2015-04-22: qty 40

## 2015-04-22 MED ORDER — ONDANSETRON HCL 4 MG PO TABS
4.0000 mg | ORAL_TABLET | Freq: Four times a day (QID) | ORAL | Status: DC | PRN
  Filled 2015-04-22: qty 1

## 2015-04-22 MED ORDER — INSULIN ASPART 100 UNIT/ML ~~LOC~~ SOLN
0.0000 [IU] | Freq: Every day | SUBCUTANEOUS | Status: DC
  Administered 2015-04-23: 3 [IU] via SUBCUTANEOUS
  Administered 2015-04-23: 2 [IU] via SUBCUTANEOUS
  Administered 2015-04-24: 5 [IU] via SUBCUTANEOUS
  Filled 2015-04-22 (×11): qty 0.05

## 2015-04-22 MED ORDER — ONDANSETRON HCL 4 MG/2ML IJ SOLN: 4.0000 mg | Freq: Four times a day (QID) | INTRAMUSCULAR | Status: DC | PRN

## 2015-04-22 MED ORDER — INSULIN ASPART 100 UNIT/ML ~~LOC~~ SOLN
0.0000 [IU] | Freq: Three times a day (TID) | SUBCUTANEOUS | Status: DC
  Administered 2015-04-23: 2 [IU] via SUBCUTANEOUS
  Administered 2015-04-23 (×2): 5 [IU] via SUBCUTANEOUS
  Administered 2015-04-24 (×2): 3 [IU] via SUBCUTANEOUS
  Administered 2015-04-25: 2 [IU] via SUBCUTANEOUS
  Administered 2015-04-25: 3 [IU] via SUBCUTANEOUS
  Filled 2015-04-22 (×31): qty 0.09

## 2015-04-22 MED ORDER — TAMSULOSIN HCL 0.4 MG PO CAPS: 0.4000 mg | ORAL_CAPSULE | Freq: Every day | ORAL | Status: DC

## 2015-04-22 MED ORDER — ASPIRIN 81 MG PO CHEW: 81.0000 mg | CHEWABLE_TABLET | Freq: Every day | ORAL | Status: DC

## 2015-04-22 MED ORDER — AMLODIPINE BESYLATE 5 MG PO TABS
5.0000 mg | ORAL_TABLET | Freq: Every day | ORAL | Status: DC
  Administered 2015-04-23 – 2015-04-25 (×3): 5 mg via ORAL
  Filled 2015-04-22 (×4): qty 1

## 2015-04-22 MED ORDER — MEMANTINE HCL 10 MG PO TABS
5.0000 mg | ORAL_TABLET | Freq: Every day | ORAL | Status: DC
  Administered 2015-04-23 – 2015-04-25 (×3): 5 mg via ORAL
  Filled 2015-04-22 (×4): qty 1

## 2015-04-22 MED ORDER — AMLODIPINE BESYLATE 5 MG PO TABS: 10.0000 mg | ORAL_TABLET | Freq: Every day | ORAL | Status: DC

## 2015-04-22 MED ORDER — HYDROMORPHONE HCL 1 MG/ML IJ SOLN
0.5000 mg | INTRAMUSCULAR | Status: DC | PRN
  Filled 2015-04-22: qty 1

## 2015-04-22 MED ORDER — TAMSULOSIN HCL 0.4 MG PO CAPS
0.4000 mg | ORAL_CAPSULE | Freq: Every day | ORAL | Status: DC
  Administered 2015-04-23 – 2015-04-25 (×3): 0.4 mg via ORAL
  Filled 2015-04-22 (×4): qty 1

## 2015-04-22 MED ORDER — INSULIN ASPART 100 UNIT/ML ~~LOC~~ SOLN: 0.0000 [IU] | SUBCUTANEOUS | Status: DC

## 2015-04-22 MED ORDER — FENOFIBRATE 54 MG PO TABS
54.0000 mg | ORAL_TABLET | Freq: Every day | ORAL | Status: DC
  Administered 2015-04-23 – 2015-04-25 (×3): 54 mg via ORAL
  Filled 2015-04-22 (×5): qty 1

## 2015-04-22 MED ORDER — SODIUM CHLORIDE 0.9 % IV SOLN: Freq: Once | INTRAVENOUS | Status: DC

## 2015-04-22 MED ORDER — ACETAMINOPHEN 650 MG RE SUPP
650.0000 mg | Freq: Four times a day (QID) | RECTAL | Status: DC | PRN
  Filled 2015-04-22: qty 1

## 2015-04-22 MED ORDER — INSULIN GLARGINE 100 UNIT/ML ~~LOC~~ SOLN
10.0000 [IU] | Freq: Every day | SUBCUTANEOUS | Status: DC
  Administered 2015-04-23: 10 [IU] via SUBCUTANEOUS
  Filled 2015-04-22 (×3): qty 0.1

## 2015-04-22 MED ORDER — CARVEDILOL 3.125 MG PO TABS
6.2500 mg | ORAL_TABLET | Freq: Two times a day (BID) | ORAL | Status: DC
  Administered 2015-04-23 – 2015-04-25 (×5): 6.25 mg via ORAL
  Filled 2015-04-22 (×2): qty 2
  Filled 2015-04-22: qty 1
  Filled 2015-04-22: qty 2
  Filled 2015-04-22: qty 1
  Filled 2015-04-22 (×2): qty 2

## 2015-04-22 NOTE — ED Provider Notes (Signed)
CSN: [id number]     Arrival date & time 04/22/15  1538 History   First MD Initiated Contact with Patient 04/22/15 1555     Chief Complaint  Patient presents with  . Blood In Stools      HPI  Patient presents for evaluation of an episode of bright red blood per rectum.  Patient describes a cough over the last several days. Seen by his primary care physician today with a normal exam and no specific treatment offered and diagnosed with upper respiratory infection. Upon returning home patient felt an urge to have a bowel movement and passed bright red blood without stool. Has not had any rectal or perirectal pain. No diarrhea. Does not strain or describe constipation.  Patient has a history of dementia. Does offer some details of his history and an participates in conversation. His daughter who is his medical power of attorney accompanies him and supplements majority of his history. She does not know when his last colonoscopy would have been. She states it is "definitely more than 10 years ago". No past history of episodes of bleeding diverticulosis hemorrhoids or fissures.  Patient has past medical history of hypertension dementia insulin dependent diabetes hypercholesterolemia benign prostatic hypertrophy. Daughter states that he has "some sort of colon surgery a long time ago". She has no details regarding this.      Past Medical History  Diagnosis Date  . Hypertension   . Hyperlipidemia   . Bradycardia   . Diabetes mellitus without complication (HCC)     type II  . Aortic stenosis   . Diastolic dysfunction     grade 1  . Dementia   . Atrial fibrillation (HCC)   . 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 Cateract    . Retna    . Cardiac catheterization N/A 12/22/2014    Procedure: Right/Left Heart Cath and Coronary Angiography;  Surgeon: Lennette Bihari, MD;  Location: [facility] INVASIVE CV LAB;  Service: Cardiovascular;   Laterality: N/A;   Family History  Problem Relation Age of Onset  . Heart disease Mother   . Dementia Neg Hx    Social History  Substance Use Topics  . Smoking status: Former Smoker    Types: Pipe    Quit date: 10/21/1982  . Smokeless tobacco: Never Used  . Alcohol Use: No    Review of Systems  Constitutional: Negative for fever, chills, diaphoresis, appetite change and fatigue.  HENT: Negative for mouth sores, sore throat and trouble swallowing.   Eyes: Negative for visual disturbance.  Respiratory: Negative for cough, chest tightness, shortness of breath and wheezing.   Cardiovascular: Negative for chest pain.  Gastrointestinal: Negative for nausea, vomiting, abdominal pain, diarrhea and abdominal distention.       Single episode of bright red blood in rectum.  Endocrine: Negative for polydipsia, polyphagia and polyuria.  Genitourinary: Negative for dysuria, frequency and hematuria.  Musculoskeletal: Negative for gait problem.  Skin: Negative for color change, pallor and rash.  Neurological: Negative for dizziness, syncope, light-headedness and headaches.  Hematological: Does not bruise/bleed easily.  Psychiatric/Behavioral: Negative for behavioral problems and confusion.      Allergies  Review of patient's allergies indicates no known allergies.  Home Medications   Prior to Admission medications   Medication Sig Start Date End Date Taking? Authorizing Provider  amLODipine (NORVASC) 10 MG tablet Take 10 mg by mouth daily.   Yes Historical Provider, MD  aspirin 81  MG chewable tablet Chew by mouth daily.   Yes Historical Provider, MD  carvedilol (COREG) 12.5 MG tablet Take 0.5 tablets (6.25 mg total) by mouth 2 (two) times daily with a meal. 1/2 tablet 02/01/14  Yes Lennette Bihari, MD  donepezil (ARICEPT) 5 MG tablet Take 5 mg by mouth daily. 11/23/14  Yes Historical Provider, MD  fenofibrate micronized (LOFIBRA) 134 MG capsule TAKE 1 BY MOUTH DAILY 03/22/14  Yes Lennette Bihari, MD  hydrochlorothiazide (MICROZIDE) 12.5 MG capsule Take 1 capsule (12.5 mg total) by mouth daily. 12/16/14  Yes Lennette Bihari, MD  HYDROcodone-acetaminophen (NORCO) 10-325 MG tablet Take 1 tablet by mouth every 6 (six) hours as needed for moderate pain or severe pain.  01/04/15  Yes Historical Provider, MD  insulin NPH-regular Human (NOVOLIN 70/30) (70-30) 100 UNIT/ML injection Take 12 units 2 times daily. If after 3 days, his blood sugar is above 200 consistently, increase the units by 2 two times daily for 5 days; if his blood sugar is still above 200, increase the units by 2 again and continue for 5 more days. Continue this regimen until his blood sugars are ranging between 120 and 160. 02/18/15  Yes Elliot Cousin, MD  memantine (NAMENDA) 5 MG tablet Take 1 tablet by mouth daily. 01/04/15  Yes Historical Provider, MD  tamsulosin (FLOMAX) 0.4 MG CAPS capsule Take 1 capsule by mouth daily. 12/28/14  Yes Historical Provider, MD   BP 138/67 mmHg  Pulse 61  Temp(Src) 98.4 F (36.9 C) (Oral)  Resp 16  Ht 5\' 10"  (1.778 m)  Wt 136 lb (61.689 kg)  BMI 19.51 kg/m2  SpO2 98% Physical Exam  Constitutional: He appears well-developed and well-nourished. No distress.  thin, but well appearing. Not distressed.  HENT:  Head: Normocephalic.  Eyes: Conjunctivae are normal. Pupils are equal, round, and reactive to light. No scleral icterus.  Neck: Normal range of motion. Neck supple. No thyromegaly present.  Cardiovascular: Normal rate and regular rhythm.  Exam reveals no gallop and no friction rub.   No murmur heard. Pulmonary/Chest: Effort normal and breath sounds normal. No respiratory distress. He has no wheezes. He has no rales.  Abdominal: Soft. Bowel sounds are normal. He exhibits no distension. There is no tenderness. There is no rebound.  Musculoskeletal: Normal range of motion.  Neurological: He is alert.  Skin: Skin is warm and dry. No rash noted.  Psychiatric: He has a normal mood and  affect. His behavior is normal.    ED Course  Procedures (including critical care time) Labs Review Labs Reviewed  CBC WITH DIFFERENTIAL/PLATELET - Abnormal; Notable for the following:    HCT 38.4 (*)    All other components within normal limits  COMPREHENSIVE METABOLIC PANEL - Abnormal; Notable for the following:    Glucose, Bld 305 (*)    BUN 35 (*)    ALT 15 (*)    Alkaline Phosphatase 26 (*)    GFR calc non Af Amer 52 (*)    All other components within normal limits  PROTIME-INR - Abnormal; Notable for the following:    Prothrombin Time 16.0 (*)    All other components within normal limits  TYPE AND SCREEN    Imaging Review No results found. I have personally reviewed and evaluated these images and lab results as part of my medical decision-making.   EKG Interpretation None      MDM   Final diagnoses:  Hematochezia    Admit for serial Hb.  Rolland PorterMark Yarielys Beed, MD 04/22/15 [phone]

## 2015-04-22 NOTE — ED Notes (Signed)
Pt request to hold off on IV until after labs come back.

## 2015-04-22 NOTE — ED Notes (Signed)
Daughter says pt had bright red blood with stool in commode.  Daughter says pt had only one stool.  Pt has not c/o any pain.

## 2015-04-22 NOTE — H&P (Signed)
[facility] History and Physical  Juan Pham WJX:[id number] DOB: [birth year] DOA: 04/22/2015  Referring physician: ED physician PCP: Cassell SmilesFUSCO,LAWRENCE J., MD  Specialists:   Chief Complaint: Bright red blood per rectum   HPI: Juan LunchRalph C Pham is a 82 y.o. male with PMH of chronic atrial fibrillation not on anticoagulation, dementia, chronic diastolic CHF, hypertension, and insulin-dependent diabetes who presents to the ED following a frankly bloody BM at home. The patient's daughters are at the bedside and assist with the history; discussion with the ED personnel also contributes. The patient had been in his usual state of health at bedtime last night, but soon after waking on the day of admission, had a bowel movement with bright red blood seen by his daughter. The patient denied pain, lightheadedness, headache, chest pain, or palpitations. He denied nausea and there is been no vomiting. Per his family's knowledge, there is been no recent fever, chills, cough, or diarrhea. His last colonoscopy was more than 10 years ago.  In ED, patient was found to be afebrile, saturating well on room air, and with vital signs stable. Initial blood work returned with an elevated BUN to serum creatinine ratio, hyperglycemia, and a very mild normocytic anemia with hematocrit of 38.4. Digital rectal exam reveals frank blood. Patient remained hemodynamically stable the emergency department with no further bleeding episodes and was admitted to the hospital for ongoing evaluation and management.  Where does patient live?   At home     Can patient participate in ADLs?  Some   Review of Systems:   Unable to obtain secondary to clinical condition with dementia and resulting cognitive deficits.     Allergy: No Known Allergies  Past Medical History  Diagnosis Date  . Hypertension   . Hyperlipidemia   . Bradycardia   . Diabetes mellitus without complication (HCC)     type II  . Aortic stenosis   . Diastolic  dysfunction     grade 1  . Dementia   . Atrial fibrillation (HCC)   . 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 Cateract    . Retna    . Cardiac catheterization N/A 12/22/2014    Procedure: Right/Left Heart Cath and Coronary Angiography;  Surgeon: Lennette Biharihomas A Kelly, MD;  [facility];  Service: Cardiovascular;  Laterality: N/A;    Social History:  reports that he quit smoking about 32 years ago. His smoking use included Pipe. He has never used smokeless tobacco. He reports that he does not drink alcohol or use illicit drugs.  Family History:  Family History  Problem Relation Age of Onset  . Heart disease Mother   . Dementia Neg Hx      Prior to Admission medications   Medication Sig Start Date End Date Taking? Authorizing Provider  amLODipine (NORVASC) 10 MG tablet Take 10 mg by mouth daily.   Yes Historical Provider, MD  aspirin 81 MG chewable tablet Chew by mouth daily.   Yes Historical Provider, MD  carvedilol (COREG) 12.5 MG tablet Take 0.5 tablets (6.25 mg total) by mouth 2 (two) times daily with a meal. 1/2 tablet 02/01/14  Yes Lennette Biharihomas A Kelly, MD  donepezil (ARICEPT) 5 MG tablet Take 5 mg by mouth daily. 11/23/14  Yes Historical Provider, MD  fenofibrate micronized (LOFIBRA) 134 MG capsule TAKE 1 BY MOUTH DAILY 03/22/14  Yes Lennette Biharihomas A Kelly, MD  hydrochlorothiazide (MICROZIDE) 12.5 MG capsule Take 1 capsule (12.5  mg total) by mouth daily. 12/16/14  Yes Lennette Bihari, MD  HYDROcodone-acetaminophen (NORCO) 10-325 MG tablet Take 1 tablet by mouth every 6 (six) hours as needed for moderate pain or severe pain.  01/04/15  Yes Historical Provider, MD  insulin NPH-regular Human (NOVOLIN 70/30) (70-30) 100 UNIT/ML injection Take 12 units 2 times daily. If after 3 days, his blood sugar is above 200 consistently, increase the units by 2 two times daily for 5 days; if his blood sugar is still above 200, increase the  units by 2 again and continue for 5 more days. Continue this regimen until his blood sugars are ranging between 120 and 160. 02/18/15  Yes Elliot Cousin, MD  memantine (NAMENDA) 5 MG tablet Take 1 tablet by mouth daily. 01/04/15  Yes Historical Provider, MD  tamsulosin (FLOMAX) 0.4 MG CAPS capsule Take 1 capsule by mouth daily. 12/28/14  Yes Historical Provider, MD    Physical Exam: Filed Vitals:   04/22/15 1550 04/22/15 1554 04/22/15 1700 04/22/15 1730  BP:  168/54 172/46 138/67  Pulse: 64  59 61  Temp: 98.4 F (36.9 C)     TempSrc: Oral     Resp: 16  16 16   Height: 5\' 10"  (1.778 m)     Weight: 61.689 kg (136 lb)     SpO2: 99%  98% 98%   General: Not in acute distress HEENT:       Eyes: PERRL, EOMI, no scleral icterus or conjunctival pallor.       ENT: No discharge from the ears or nose, no pharyngeal ulcers, petechiae or exudate, no tonsillar enlargement.        Neck: No JVD, no appreciable mass; bruit exam limited by radiation of loud heart murmur into neck  Heme: No cervical adenopathy, no pallor Cardiac: S1/S2, RRR, grade IV holosystolic murmur throughout precordium with radiation into neck, No gallops or rubs. Pulm: Good air movement bilaterally. No rales, wheezing, rhonchi or rubs. Abd: Soft, nondistended, nontender, no rebound pain or gaurding, no mass or organomegaly, BS present. Ext: No LE edema bilaterally. 1+DP/PT pulse bilaterally. Musculoskeletal: No gross deformity, no red, hot, swollen joints   Skin: No rashes or wounds on exposed surfaces  Neuro: Alert, oriented to person only, cranial nerves II-XII grossly intact. No focal findings Psych: Patient is not overtly psychotic, sobbing when daughter tells him she's going home.  Labs on Admission:  Basic Metabolic Panel:  Recent Labs Lab 04/22/15 1700  NA 138  K 4.2  CL 101  CO2 28  GLUCOSE 305*  BUN 35*  CREATININE 1.24  CALCIUM 9.5   Liver Function Tests:  Recent Labs Lab 04/22/15 1700  AST 15  ALT 15*   ALKPHOS 26*  BILITOT 0.8  PROT 7.0  ALBUMIN 4.1   No results for input(s): LIPASE, AMYLASE in the last 168 hours. No results for input(s): AMMONIA in the last 168 hours. CBC:  Recent Labs Lab 04/22/15 1700  WBC 8.3  NEUTROABS 5.3  HGB 13.0  HCT 38.4*  MCV 90.6  PLT 325   Cardiac Enzymes: No results for input(s): CKTOTAL, CKMB, CKMBINDEX, TROPONINI in the last 168 hours.  BNP (last 3 results) No results for input(s): BNP in the last 8760 hours.  ProBNP (last 3 results) No results for input(s): PROBNP in the last 8760 hours.  CBG: No results for input(s): GLUCAP in the last 168 hours.  Radiological Exams on Admission: No results found.  EKG: Independently reviewed.  Abnormal findings:  Sinus rhythm, RSR' in anterior leads   Assessment/Plan  1. GI bleed  - 1 BM with ~400 cc bright red blood per pt's daughter  - Initial H/H okay, consistent with pt's baseline  - Suspect lower source, likely diverticular vs AVM given bright red blood, absence of pain  - BUN is modestly elevated, perhaps suggestive of upper GI source but more likely secondary to dehydration  - Type and screen done; blood bank asked to hold 2 units  - Serial H/H overnight, transfuse for acute drop or brisk bleed  - Hold ASA, no pharmacologic VTE ppx   2. Insulin-dependent DM  - Hold home insulins while inpt  - Check CBGs q4h while NPO  - Basal coverage with 10 units Lantus for now - SSI correctional regimen - Adjust according to CBGs  - A1c pending; was 9.2% on 02/09/15, suggesting poor control    3. Hypertension - BP okay thus far  - Reduced dose of his home antihypertensives in setting of GIB and concern for recurrences overnight  - Resume full-dose when appropriate   4. Severe AS - Careful with volume-status, avoid dropping preload  - Clinically, he is dry on presentation   - Gently hydrating with NS at 100 cc/hr overnight     DVT ppx:  SCDs  Code Status: Full code Family  Communication:  Yes, patient's daughter at bed side Disposition Plan: Admit to inpatient   Date of Service 04/22/2015    Briscoe Deutscher, MD [facility] Pager [phone]  If 7PM-7AM, please contact night-coverage [email or web address] Password [email or web address] 04/22/2015, 7:01 PM

## 2015-04-22 NOTE — ED Notes (Signed)
Family was told to bring pt to ED after pt had an episode of bright red blood with bowel movement.

## 2015-04-22 NOTE — ED Notes (Signed)
Dr Fayrene FearingJames informed of pt wanting to hold IV until labs are back.  Dr to evaluate.

## 2015-04-23 DIAGNOSIS — K649 Unspecified hemorrhoids: Secondary | ICD-10-CM

## 2015-04-23 DIAGNOSIS — K222 Esophageal obstruction: Secondary | ICD-10-CM

## 2015-04-23 DIAGNOSIS — K921 Melena: Secondary | ICD-10-CM

## 2015-04-23 DIAGNOSIS — I10 Essential (primary) hypertension: Secondary | ICD-10-CM

## 2015-04-23 LAB — GLUCOSE, CAPILLARY
GLUCOSE-CAPILLARY: 270 mg/dL — AB (ref 65–99)
Glucose-Capillary: 196 mg/dL — ABNORMAL HIGH (ref 65–99)
Glucose-Capillary: 213 mg/dL — ABNORMAL HIGH (ref 65–99)
Glucose-Capillary: 230 mg/dL — ABNORMAL HIGH (ref 65–99)
Glucose-Capillary: 277 mg/dL — ABNORMAL HIGH (ref 65–99)

## 2015-04-23 LAB — COMPREHENSIVE METABOLIC PANEL
ALBUMIN: 3.7 g/dL (ref 3.5–5.0)
ALK PHOS: 23 U/L — AB (ref 38–126)
ALT: 13 U/L — AB (ref 17–63)
AST: 14 U/L — AB (ref 15–41)
Anion gap: 7 (ref 5–15)
BILIRUBIN TOTAL: 1.1 mg/dL (ref 0.3–1.2)
BUN: 30 mg/dL — AB (ref 6–20)
CALCIUM: 8.8 mg/dL — AB (ref 8.9–10.3)
CO2: 28 mmol/L (ref 22–32)
Chloride: 103 mmol/L (ref 101–111)
Creatinine, Ser: 1.02 mg/dL (ref 0.61–1.24)
GFR calc Af Amer: >60 mL/min (ref >60)
GFR calc non Af Amer: >60 mL/min (ref >60)
GLUCOSE: 218 mg/dL — AB (ref 65–99)
Potassium: 3.7 mmol/L (ref 3.5–5.1)
SODIUM: 138 mmol/L (ref 135–145)
TOTAL PROTEIN: 6.6 g/dL (ref 6.5–8.1)

## 2015-04-23 LAB — URINALYSIS, ROUTINE W REFLEX MICROSCOPIC
Nitrite: POSITIVE — AB
PROTEIN: 100 mg/dL — AB
SPECIFIC GRAVITY, URINE: 1.02 (ref 1.005–1.030)
pH: 6.5 (ref 5.0–8.0)

## 2015-04-23 LAB — HEMATOCRIT
HCT: 34.7 % — ABNORMAL LOW (ref 39.0–52.0)
HCT: 36.8 % — ABNORMAL LOW (ref 39.0–52.0)

## 2015-04-23 LAB — URINE MICROSCOPIC-ADD ON: SQUAMOUS EPITHELIAL / LPF: NONE SEEN

## 2015-04-23 LAB — HEMOGLOBIN
Hemoglobin: 11.8 g/dL — ABNORMAL LOW (ref 13.0–17.0)
Hemoglobin: 12.3 g/dL — ABNORMAL LOW (ref 13.0–17.0)

## 2015-04-23 LAB — PROTIME-INR
INR: 1.32 (ref 0.00–1.49)
Prothrombin Time: 16.5 seconds — ABNORMAL HIGH (ref 11.6–15.2)

## 2015-04-23 MED ORDER — CLOTRIMAZOLE 1 % EX CREA
1.0000 "application " | TOPICAL_CREAM | Freq: Three times a day (TID) | CUTANEOUS | Status: DC
  Administered 2015-04-23 – 2015-04-25 (×6): 1 via TOPICAL
  Filled 2015-04-23: qty 15

## 2015-04-23 MED ORDER — CEFTRIAXONE SODIUM 1 G IJ SOLR
1.0000 g | INTRAMUSCULAR | Status: DC
  Administered 2015-04-23 – 2015-04-24 (×2): 1 g via INTRAVENOUS
  Filled 2015-04-23 (×3): qty 10

## 2015-04-23 MED ORDER — HYDROCHLOROTHIAZIDE 12.5 MG PO CAPS
12.5000 mg | ORAL_CAPSULE | Freq: Every day | ORAL | Status: DC
  Administered 2015-04-25: 12.5 mg via ORAL
  Filled 2015-04-23 (×2): qty 1

## 2015-04-23 MED ORDER — PANTOPRAZOLE SODIUM 40 MG PO TBEC
40.0000 mg | DELAYED_RELEASE_TABLET | Freq: Two times a day (BID) | ORAL | Status: DC
  Administered 2015-04-23 – 2015-04-25 (×4): 40 mg via ORAL
  Filled 2015-04-23 (×6): qty 1

## 2015-04-23 MED ORDER — SODIUM CHLORIDE 0.9 % IV SOLN
INTRAVENOUS | Status: DC
  Administered 2015-04-23: 22:00:00 via INTRAVENOUS

## 2015-04-23 MED ORDER — DEXTROSE 5 % IV SOLN
INTRAVENOUS | Status: AC
  Filled 2015-04-23: qty 10

## 2015-04-23 MED ORDER — INSULIN GLARGINE 100 UNIT/ML ~~LOC~~ SOLN
5.0000 [IU] | Freq: Every day | SUBCUTANEOUS | Status: DC
  Administered 2015-04-23: 5 [IU] via SUBCUTANEOUS
  Filled 2015-04-23 (×2): qty 0.05

## 2015-04-23 MED ORDER — HYDROCORTISONE 2.5 % RE CREA
1.0000 "application " | TOPICAL_CREAM | Freq: Three times a day (TID) | RECTAL | Status: DC
  Administered 2015-04-23 – 2015-04-25 (×6): 1 via RECTAL
  Filled 2015-04-23: qty 28.35

## 2015-04-23 NOTE — Consult Note (Addendum)
Referring Provider: No ref. provider found Primary Care Physician:  Cassell SmilesFUSCO,LAWRENCE J., MD Primary Gastroenterologist:  Jonette EvaSandi Tenessa Marsee  Reason for Consultation:  RECTAL BLEEDING   Impression: ADMITTED WITH DYSPHAGIA AND RECTAL BLEEDING. DYSPHAGIA MOST LIKELY DUE TO COGNITIVE DISORDER AND RECTAL BLEEDING MOST LIKELY DUE TO HEMORRHOIDS WHILE TAKING ASA AND LESS LIKELY STERCORAL ULCER, DIVERTICULOSIS, OR POLYPS. I PERSONALLY REVIEWED PICTURE FROM DAUGHTER'S PHONE. DYSPHAGIA DUE TO CANDIDA ESOPHAGITIS, PEPTIC STRICTURE, LESS LIKELY ESOPHAGEAL OR GE JUNCTION TUMOR. I PERSONALLY REVIEWED EMR FROM 2009 TO PRESENT.   Plan: 1. EGD/DIL/FLEX SIG TOMORROW. DISCUSSED PROCEDURE, BENEFITS, & RISKS: < 1% chance of medication reaction, bleeding,OR  perforation. 2. DYSPHAGIA 2 DIET. NPO AFTER MN EXCEPT SIPS WITH MEDS 3. BID PPI  GREATER THAN 50% WAS SPENT IN COUNSELING & COORDINATION OF CARE WITH THE PATIENT'S DAUGHTER. DISCUSSED REVIEWED REPORT FOR ENDOSCOPY IN 2009, DIFFERENTIAL DIAGNOSIS, PROCEDURE, BENEFITS, RISKS, AND MANAGEMENT OF DYSPHAGIA AND RECTAL BLEEDING. TOTAL ENCOUNTER TIME: 80 MINS.    HPI:  HAD BLEEDING ONE TIME BEFORE YEARS AGO. LAST SEEN BY GI FOR TCS IN 2009: DIVERTICULOSIS/HEMORRHOIDS. IN HIS USUAL STATE F HEALTH UNTIL YESTERDAY MORNING WHEN WENT TO [place]. HAD BM AND HAD CUP OF DARK RED BLOOD. DIDN'T PASS OUT. NO CHEST PAIN OR SOB. WAS IN HOSPITAL 1 WEEK AGO. STAYS COLD. APPETITE: ALRIGHT. HAS HAD A SORE THROAT. USUAL C/O PAIN IN HEAD AND NECK. PROBLEMS SWALLOWING(LIQUIDS AND SOLIDS): DOING A LOT OF SPITTING AND THINGS WON'T GO DOWN. MEMORY GOTTEN REALLY BAD IN PAST 30 DAYS. WEIGHT: ~10 OVER LAST MOS. SEEN RECENTLY FOR [facility] REPAIR EVALUATION. SURGERY ON HOLD. PT DENIES FEVER, CHILLS, nausea, vomiting, melena, diarrhea,  CHANGE IN BOWEL IN HABITS, constipation, abdominal pain, problems swallowing, OR heartburn or indigestion.   Past Medical History  Diagnosis Date  . Hypertension   .  Hyperlipidemia   . Bradycardia   . Diabetes mellitus without complication (HCC)     type II  . Aortic stenosis   . Diastolic dysfunction     grade 1  . Dementia   . Atrial fibrillation (HCC)   . 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 Cateract    . Retna    . Cardiac catheterization N/A 12/22/2014     SEVERE AORTIC STENOSIS    Prior to Admission medications   Medication Sig Start Date End Date Taking? Authorizing Provider  amLODipine (NORVASC) 10 MG tablet Take 10 mg by mouth daily.      aspirin 81 MG chewable tablet Chew by mouth daily.      carvedilol (COREG) 12.5 MG tablet Take 0.5 tablets (6.25 mg total) by mouth 2 (two) times daily with a meal. 1/2 tablet 02/01/14     donepezil (ARICEPT) 5 MG tablet Take 5 mg by mouth daily. 11/23/14     fenofibrate micronized (LOFIBRA) 134 MG capsule TAKE 1 BY MOUTH DAILY 03/22/14     hydrochlorothiazide (MICROZIDE) 12.5 MG capsule Take 1 capsule (12.5 mg total) by mouth daily. 12/16/14     HYDROcodone-acetaminophen (NORCO) 10-325 MG tablet Take 1 tablet by mouth every 6 (six) hours as needed for moderate pain or severe pain.  01/04/15     insulin NPH-regular Human (NOVOLIN 70/30) (70-30) 100 UNIT/ML injection Take 12 units 2 times daily. If after 3 days, his blood sugar is above 200 consistently, increase the units by 2 two times daily for 5 days; if his blood sugar is still above 200, increase the units by 2 again  and continue for 5 more days. Continue this regimen until his blood sugars are ranging between 120 and 160. 02/18/15     memantine (NAMENDA) 5 MG tablet Take 1 tablet by mouth daily. 01/04/15     tamsulosin (FLOMAX) 0.4 MG CAPS capsule Take 1 capsule by mouth daily. 12/28/14      Current Facility-Administered Medications  Medication Dose Route Frequency Provider Last Rate Last Dose  . 0.9 %  sodium chloride infusion   Intravenous Once     . acetaminophen (TYLENOL) tablet 650  mg  650 mg Oral Q6H PRN      Or  . acetaminophen (TYLENOL) suppository 650 mg  650 mg Rectal Q6H PRN     . amLODipine (NORVASC) tablet 5 mg  5 mg Oral Daily     . carvedilol (COREG) tablet 6.25 mg  6.25 mg Oral BID WC     . donepezil (ARICEPT) tablet 5 mg  5 mg Oral Daily     . fenofibrate tablet 54 mg  54 mg Oral Daily     . hydrochlorothiazide (MICROZIDE) capsule 12.5 mg  12.5 mg Oral Daily     . HYDROcodone-acetaminophen (NORCO/VICODIN) 5-325 MG per tablet 2 tablet  2 tablet Oral Q4H PRN     . HYDROmorphone (DILAUDID) injection 0.5 mg  0.5 mg Intravenous Q3H PRN     . insulin aspart (novoLOG) injection 0-5 Units  0-5 Units Subcutaneous QHS     . insulin aspart (novoLOG) injection 0-9 Units  0-9 Units Subcutaneous TID WC     . insulin glargine (LANTUS) injection 10 Units  10 Units Subcutaneous QHS     . memantine (NAMENDA) tablet 5 mg  5 mg Oral Daily     . ondansetron (ZOFRAN) tablet 4 mg  4 mg Oral Q6H PRN      Or  . ondansetron (ZOFRAN) injection 4 mg  4 mg Intravenous Q6H PRN     . pantoprazole (PROTONIX) injection 40 mg  40 mg Intravenous Q24H     . sodium chloride 0.9 % injection 3 mL  3 mL Intravenous Q12H     . tamsulosin (FLOMAX) capsule 0.4 mg  0.4 mg Oral Daily       Allergies as of 04/22/2015  . (No Known Allergies)    Family History  Problem Relation Age of Onset  . Heart disease Mother   . Dementia Neg Hx      Social History   Social History  . Marital Status: Widowed    Spouse Name: N/A  . Number of Children: 3  . Years of Education: HS   Occupational History  . Retired Other   Social History Main Topics  . Smoking status: Former Smoker    Types: Pipe    Quit date: 10/21/1982  . Smokeless tobacco: Never Used  . Alcohol Use: No  . Drug Use: No  . Sexual Activity: Not on file   Other Topics Concern  . Not on file   Social History Narrative   Patient lives at home alone.   Caffeine Use: 2 cups daily   Review of Systems: PER HPI OTHERWISE ALL  SYSTEMS ARE NEGATIVE.  Vitals: Blood pressure 138/72, pulse 64, temperature 98.7 F (37.1 C), temperature source Oral, resp. rate 20, height 5\' 10"  (1.778 m), weight 133 lb 2.5 oz (60.4 kg), SpO2 98 %.  Physical Exam: General:   Alert,  Well-developed, well-nourished, pleasant and cooperative in NAD Head:  Normocephalic and atraumatic. Eyes:  Sclera clear, no  icterus.   Conjunctiva pink. Mouth:  No lesions, dentition abnormal. Neck:  Supple; no masses. Lungs:  Clear throughout to auscultation.   No wheezes. No acute distress. Heart:  Regular rate and irregular rhythm; systolic murmur, clicks, rubs,  or gallops. Abdomen:  Soft, nontender and nondistended. No masses noted. Normal bowel sounds, without guarding, and without rebound.   Msk:  Symmetrical without gross deformities. Normal posture. Extremities:  Without edema. Neurologic:  Alert and  interactive, NO  NEW FOCAL DEFICITS Cervical Nodes:  No significant cervical adenopathy. Psych:  Alert and cooperative. Normal mood and affect.   Lab Results:  Recent Labs  04/22/15 1700 04/22/15 1947 04/23/15 0055 04/23/15 0618  WBC 8.3  --   --   --   HGB 13.0 12.3* 11.8* 12.3*  HCT 38.4* 35.6* 34.7* 36.8*  PLT 325  --   --   --    BMET  Recent Labs  04/22/15 1700 04/23/15 0618  NA 138 138  K 4.2 3.7  CL 101 103  CO2 28 28  GLUCOSE 305* 218*  BUN 35* 30*  CREATININE 1.24 1.02  CALCIUM 9.5 8.8*   LFT  Recent Labs  04/23/15 0618  PROT 6.6  ALBUMIN 3.7  AST 14*  ALT 13*  ALKPHOS 23*  BILITOT 1.1     Studies/Results: CXR: no acute process   LOS: 1 day   Alanni Vader  04/23/2015, 11:02 AM

## 2015-04-23 NOTE — Progress Notes (Signed)
Pt has blood in his urine when cleaning him up. No stool with blood. Pt did not complain of any abdominal pain.  MD notified. UA will be obtained and sent down. Lesly Dukesachel J Everett, RN

## 2015-04-23 NOTE — Progress Notes (Signed)
[facility] PROGRESS NOTE  Ellie LunchRalph C Siefker ZOX:[id number] DOB: November 23, 1930 DOA: 04/22/2015 PCP: Cassell SmilesFUSCO,LAWRENCE J., MD  Assessment/Plan: Hematochezia -Seen by GI with plans for a flexible sigmoidoscopy in a.m. -Hemoglobin remained stable without indications for transfusion at present.  Hematuria -Aspirin has been discontinued. Sent for UA and culture.  Dysphagia -Daughter describes about a 2 week history of progressive dysphagia with choking when eating. -GI has planned for EGD with potential dilatation in a.m.  Insulin-dependent diabetes mellitus -Fair control, monitor CBGs and adjust regimen as needed.  Essential hypertension -We'll controlled, continue current medications.  Severe aortic stenosis -Noted careful with volume status.  Code Status: DO NOT RESUSCITATE Family Communication: Discussed with daughter Pam at bedside, updated on plan of care and all questions answered  Disposition Plan: Home when medically ready, anticipate 24-48 hours   Consultants:  GI   Antibiotics:  None   Subjective: Demented, not oriented  Objective: Filed Vitals:   04/22/15 2336 04/23/15 0500 04/23/15 0651 04/23/15 1517  BP: 132/68  138/72 146/60  Pulse: 62  64 61  Temp: 98 F (36.7 C)  98.7 F (37.1 C) 97.8 F (36.6 C)  TempSrc: Oral  Oral Oral  Resp: 20  20 20   Height:      Weight: 61.1 kg (134 lb 11.2 oz) 60.4 kg (133 lb 2.5 oz)    SpO2: 97%  98% 99%    Intake/Output Summary (Last 24 hours) at 04/23/15 1635 Last data filed at 04/23/15 1230  Gross per 24 hour  Intake    720 ml  Output      0 ml  Net    720 ml   Filed Weights   04/22/15 1550 04/22/15 2336 04/23/15 0500  Weight: 61.689 kg (136 lb) 61.1 kg (134 lb 11.2 oz) 60.4 kg (133 lb 2.5 oz)    Exam:   General:  Awake, not oriented  Cardiovascular: Regular rate and rhythm, strong systolic ejection murmur  Respiratory: Clear to auscultation bilaterally  Abdomen: Soft, nontender, nondistended,  positive bowel sounds  Extremities: Trace bilateral pitting edema   Neurologic:  Moves all 4 spontaneously  Data Reviewed: Basic Metabolic Panel:  Recent Labs Lab 04/22/15 1700 04/23/15 0618  NA 138 138  K 4.2 3.7  CL 101 103  CO2 28 28  GLUCOSE 305* 218*  BUN 35* 30*  CREATININE 1.24 1.02  CALCIUM 9.5 8.8*   Liver Function Tests:  Recent Labs Lab 04/22/15 1700 04/23/15 0618  AST 15 14*  ALT 15* 13*  ALKPHOS 26* 23*  BILITOT 0.8 1.1  PROT 7.0 6.6  ALBUMIN 4.1 3.7   No results for input(s): LIPASE, AMYLASE in the last 168 hours. No results for input(s): AMMONIA in the last 168 hours. CBC:  Recent Labs Lab 04/22/15 1700 04/22/15 1947 04/23/15 0055 04/23/15 0618  WBC 8.3  --   --   --   NEUTROABS 5.3  --   --   --   HGB 13.0 12.3* 11.8* 12.3*  HCT 38.4* 35.6* 34.7* 36.8*  MCV 90.6  --   --   --   PLT 325  --   --   --    Cardiac Enzymes: No results for input(s): CKTOTAL, CKMB, CKMBINDEX, TROPONINI in the last 168 hours. BNP (last 3 results)  Recent Labs  04/22/15 1700  BNP 79.0    ProBNP (last 3 results) No results for input(s): PROBNP in the last 8760 hours.  CBG:  Recent Labs Lab 04/22/15 2206 04/23/15 0032  04/23/15 0749 04/23/15 1210  GLUCAP 246* 230* 196* 270*    No results found for this or any previous visit (from the past 240 hour(s)).   Studies: No results found.  Scheduled Meds: . sodium chloride   Intravenous Once  . amLODipine  5 mg Oral Daily  . carvedilol  6.25 mg Oral BID WC  . clotrimazole  1 application Topical TID BM  . donepezil  5 mg Oral Daily  . fenofibrate  54 mg Oral Daily  . [START ON 04/25/2015] hydrochlorothiazide  12.5 mg Oral Daily  . hydrocortisone  1 application Rectal TID  . insulin aspart  0-5 Units Subcutaneous QHS  . insulin aspart  0-9 Units Subcutaneous TID WC  . insulin glargine  5 Units Subcutaneous QHS  . memantine  5 mg Oral Daily  . pantoprazole  40 mg Oral BID AC  . sodium chloride  3  mL Intravenous Q12H  . tamsulosin  0.4 mg Oral Daily   Continuous Infusions:   Principal Problem:   Hematochezia Active Problems:   Essential hypertension   Diabetes mellitus without complication (HCC)   Severe aortic valve stenosis   Lower GI bleed   Insulin dependent diabetes mellitus (HCC)    Time spent: 25 minutes. Greater than 50% of this time was spent in direct contact with the patient coordinating care.    Chaya Jan  [facility] Pager [phone]  If 7PM-7AM, please contact night-coverage at [email or web address], password [facility] 04/23/2015, 4:35 PM  LOS: 1 day

## 2015-04-24 ENCOUNTER — Encounter (HOSPITAL_COMMUNITY): Payer: Self-pay | Admitting: *Deleted

## 2015-04-24 ENCOUNTER — Encounter (HOSPITAL_COMMUNITY)
Admission: EM | Disposition: A | Discharge status: Home / Self Care | Payer: Self-pay | Source: Home / Self Care | Attending: Internal Medicine

## 2015-04-24 DIAGNOSIS — K2990 Gastroduodenitis, unspecified, without bleeding: Secondary | ICD-10-CM

## 2015-04-24 DIAGNOSIS — R1310 Dysphagia, unspecified: Secondary | ICD-10-CM

## 2015-04-24 DIAGNOSIS — K2970 Gastritis, unspecified, without bleeding: Secondary | ICD-10-CM

## 2015-04-24 HISTORY — PX: FLEXIBLE SIGMOIDOSCOPY: SHX5431

## 2015-04-24 HISTORY — PX: ESOPHAGEAL DILATION: SHX303

## 2015-04-24 HISTORY — PX: ESOPHAGOGASTRODUODENOSCOPY: SHX5428

## 2015-04-24 LAB — CBC WITH DIFFERENTIAL/PLATELET
BASOS PCT: 1 %
Basophils Absolute: 0.1 10*3/uL (ref 0.0–0.1)
EOS ABS: 0.3 10*3/uL (ref 0.0–0.7)
Eosinophils Relative: 3 %
HEMATOCRIT: 36.7 % — AB (ref 39.0–52.0)
HEMOGLOBIN: 12.5 g/dL — AB (ref 13.0–17.0)
LYMPHS ABS: 2.2 10*3/uL (ref 0.7–4.0)
Lymphocytes Relative: 26 %
MCH: 30.6 pg (ref 26.0–34.0)
MCHC: 34.1 g/dL (ref 30.0–36.0)
MCV: 90 fL (ref 78.0–100.0)
MONO ABS: 0.7 10*3/uL (ref 0.1–1.0)
MONOS PCT: 8 %
NEUTROS PCT: 62 %
Neutro Abs: 5.3 10*3/uL (ref 1.7–7.7)
Platelets: 296 10*3/uL (ref 150–400)
RBC: 4.08 MIL/uL — ABNORMAL LOW (ref 4.22–5.81)
RDW: 12.9 % (ref 11.5–15.5)
WBC: 8.5 10*3/uL (ref 4.0–10.5)

## 2015-04-24 LAB — GLUCOSE, CAPILLARY
GLUCOSE-CAPILLARY: 211 mg/dL — AB (ref 65–99)
GLUCOSE-CAPILLARY: 380 mg/dL — AB (ref 65–99)
Glucose-Capillary: 206 mg/dL — ABNORMAL HIGH (ref 65–99)

## 2015-04-24 SURGERY — EGD (ESOPHAGOGASTRODUODENOSCOPY)
Anesthesia: Moderate Sedation

## 2015-04-24 MED ORDER — MIDAZOLAM HCL 5 MG/5ML IJ SOLN
INTRAMUSCULAR | Status: DC | PRN
  Administered 2015-04-24: 1 mg via INTRAVENOUS
  Administered 2015-04-24: 2 mg via INTRAVENOUS

## 2015-04-24 MED ORDER — MEPERIDINE HCL 100 MG/ML IJ SOLN
INTRAMUSCULAR | Status: AC
  Filled 2015-04-24: qty 2

## 2015-04-24 MED ORDER — MEPERIDINE HCL 100 MG/ML IJ SOLN
INTRAMUSCULAR | Status: DC | PRN
  Administered 2015-04-24: 25 mg via INTRAVENOUS

## 2015-04-24 MED ORDER — ATROPINE SULFATE 1 MG/ML IJ SOLN
INTRAMUSCULAR | Status: DC | PRN
  Administered 2015-04-24: .5 mg via INTRAVENOUS

## 2015-04-24 MED ORDER — MINERAL OIL PO OIL
TOPICAL_OIL | ORAL | Status: AC
  Filled 2015-04-24: qty 30

## 2015-04-24 MED ORDER — LIDOCAINE VISCOUS 2 % MT SOLN
OROMUCOSAL | Status: AC
  Filled 2015-04-24: qty 15

## 2015-04-24 MED ORDER — INSULIN GLARGINE 100 UNIT/ML ~~LOC~~ SOLN
10.0000 [IU] | Freq: Every day | SUBCUTANEOUS | Status: DC
  Administered 2015-04-24: 10 [IU] via SUBCUTANEOUS
  Filled 2015-04-24 (×2): qty 0.1

## 2015-04-24 MED ORDER — SIMETHICONE 40 MG/0.6ML PO SUSP
ORAL | Status: DC | PRN
  Administered 2015-04-24: 12:00:00

## 2015-04-24 MED ORDER — LIDOCAINE VISCOUS 2 % MT SOLN
OROMUCOSAL | Status: DC | PRN
  Administered 2015-04-24: 4 mL via OROMUCOSAL

## 2015-04-24 MED ORDER — ATROPINE SULFATE 1 MG/ML IJ SOLN
INTRAMUSCULAR | Status: AC
  Filled 2015-04-24: qty 1

## 2015-04-24 MED ORDER — MIDAZOLAM HCL 5 MG/5ML IJ SOLN
INTRAMUSCULAR | Status: AC
  Filled 2015-04-24: qty 10

## 2015-04-24 NOTE — Interval H&P Note (Signed)
History and Physical Interval Note:  04/24/2015 11:40 AM  Juan Pham  has presented today for surgery, with the diagnosis of rectal bleeding, weight loss, dysphagia  The various methods of treatment have been discussed with the patient and family. After consideration of risks, benefits and other options for treatment, the patient has consented to  Procedure(s): ESOPHAGOGASTRODUODENOSCOPY (EGD) (N/A) ESOPHAGEAL DILATION (N/A) FLEXIBLE SIGMOIDOSCOPY (N/A) as a surgical intervention .  The patient's history has been reviewed, patient examined, no change in status, stable for surgery.  I have reviewed the patient's chart and labs.  Questions were answered to the patient's satisfaction.     Eaton CorporationSandi Akia Montalban

## 2015-04-24 NOTE — H&P (View-Only) (Signed)
Referring Provider: No ref. provider found Primary Care Physician:  Cassell SmilesFUSCO,LAWRENCE J., MD Primary Gastroenterologist:  Jonette EvaSandi Aneeka Bowden  Reason for Consultation:  RECTAL BLEEDING   Impression: ADMITTED WITH DYSPHAGIA AND RECTAL BLEEDING. DYSPHAGIA MOST LIKELY DUE TO COGNITIVE DISORDER AND RECTAL BLEEDING MOST LIKELY DUE TO HEMORRHOIDS WHILE TAKING ASA AND LESS LIKELY STERCORAL ULCER, DIVERTICULOSIS, OR POLYPS. I PERSONALLY REVIEWED PICTURE FROM DAUGHTER'S PHONE. DYSPHAGIA DUE TO CANDIDA ESOPHAGITIS, PEPTIC STRICTURE, LESS LIKELY ESOPHAGEAL OR GE JUNCTION TUMOR. I PERSONALLY REVIEWED EMR FROM 2009 TO PRESENT.   Plan: 1. EGD/DIL/FLEX SIG TOMORROW. DISCUSSED PROCEDURE, BENEFITS, & RISKS: < 1% chance of medication reaction, bleeding,OR  perforation. 2. DYSPHAGIA 2 DIET. NPO AFTER MN EXCEPT SIPS WITH MEDS 3. BID PPI  GREATER THAN 50% WAS SPENT IN COUNSELING & COORDINATION OF CARE WITH THE PATIENT'S DAUGHTER. DISCUSSED REVIEWED REPORT FOR ENDOSCOPY IN 2009, DIFFERENTIAL DIAGNOSIS, PROCEDURE, BENEFITS, RISKS, AND MANAGEMENT OF DYSPHAGIA AND RECTAL BLEEDING. TOTAL ENCOUNTER TIME: 80 MINS.    HPI:  HAD BLEEDING ONE TIME BEFORE YEARS AGO. LAST SEEN BY GI FOR TCS IN 2009: DIVERTICULOSIS/HEMORRHOIDS. IN HIS USUAL STATE [facility] UNTIL YESTERDAY MORNING WHEN WENT TO [place]. HAD BM AND HAD CUP OF DARK RED BLOOD. DIDN'T PASS OUT. NO CHEST PAIN OR SOB. WAS IN HOSPITAL 1 WEEK AGO. STAYS COLD. APPETITE: ALRIGHT. HAS HAD A SORE THROAT. USUAL C/O PAIN IN HEAD AND NECK. PROBLEMS SWALLOWING(LIQUIDS AND SOLIDS): DOING A LOT OF SPITTING AND THINGS WON'T GO DOWN. MEMORY GOTTEN REALLY BAD IN PAST 30 DAYS. WEIGHT: ~10 OVER LAST MOS. SEEN RECENTLY FOR [facility] REPAIR EVALUATION. SURGERY ON HOLD. PT DENIES FEVER, CHILLS, nausea, vomiting, melena, diarrhea,  CHANGE IN BOWEL IN HABITS, constipation, abdominal pain, problems swallowing, OR heartburn or indigestion.   Past Medical History  Diagnosis Date  . Hypertension   .  Hyperlipidemia   . Bradycardia   . Diabetes mellitus without complication (HCC)     type II  . Aortic stenosis   . Diastolic dysfunction     grade 1  . Dementia   . Atrial fibrillation (HCC)   . 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 Cateract    . Retna    . Cardiac catheterization N/A 12/22/2014     SEVERE AORTIC STENOSIS    Prior to Admission medications   Medication Sig Start Date End Date Taking? Authorizing Provider  amLODipine (NORVASC) 10 MG tablet Take 10 mg by mouth daily.      aspirin 81 MG chewable tablet Chew by mouth daily.      carvedilol (COREG) 12.5 MG tablet Take 0.5 tablets (6.25 mg total) by mouth 2 (two) times daily with a meal. 1/2 tablet 02/01/14     donepezil (ARICEPT) 5 MG tablet Take 5 mg by mouth daily. 11/23/14     fenofibrate micronized (LOFIBRA) 134 MG capsule TAKE 1 BY MOUTH DAILY 03/22/14     hydrochlorothiazide (MICROZIDE) 12.5 MG capsule Take 1 capsule (12.5 mg total) by mouth daily. 12/16/14     HYDROcodone-acetaminophen (NORCO) 10-325 MG tablet Take 1 tablet by mouth every 6 (six) hours as needed for moderate pain or severe pain.  01/04/15     insulin NPH-regular Human (NOVOLIN 70/30) (70-30) 100 UNIT/ML injection Take 12 units 2 times daily. If after 3 days, his blood sugar is above 200 consistently, increase the units by 2 two times daily for 5 days; if his blood sugar is still above 200, increase the units by 2 again  and continue for 5 more days. Continue this regimen until his blood sugars are ranging between 120 and 160. 02/18/15     memantine (NAMENDA) 5 MG tablet Take 1 tablet by mouth daily. 01/04/15     tamsulosin (FLOMAX) 0.4 MG CAPS capsule Take 1 capsule by mouth daily. 12/28/14      Current Facility-Administered Medications  Medication Dose Route Frequency Provider Last Rate Last Dose  . 0.9 %  sodium chloride infusion   Intravenous Once     . acetaminophen (TYLENOL) tablet 650  mg  650 mg Oral Q6H PRN      Or  . acetaminophen (TYLENOL) suppository 650 mg  650 mg Rectal Q6H PRN     . amLODipine (NORVASC) tablet 5 mg  5 mg Oral Daily     . carvedilol (COREG) tablet 6.25 mg  6.25 mg Oral BID WC     . donepezil (ARICEPT) tablet 5 mg  5 mg Oral Daily     . fenofibrate tablet 54 mg  54 mg Oral Daily     . hydrochlorothiazide (MICROZIDE) capsule 12.5 mg  12.5 mg Oral Daily     . HYDROcodone-acetaminophen (NORCO/VICODIN) 5-325 MG per tablet 2 tablet  2 tablet Oral Q4H PRN     . HYDROmorphone (DILAUDID) injection 0.5 mg  0.5 mg Intravenous Q3H PRN     . insulin aspart (novoLOG) injection 0-5 Units  0-5 Units Subcutaneous QHS     . insulin aspart (novoLOG) injection 0-9 Units  0-9 Units Subcutaneous TID WC     . insulin glargine (LANTUS) injection 10 Units  10 Units Subcutaneous QHS     . memantine (NAMENDA) tablet 5 mg  5 mg Oral Daily     . ondansetron (ZOFRAN) tablet 4 mg  4 mg Oral Q6H PRN      Or  . ondansetron (ZOFRAN) injection 4 mg  4 mg Intravenous Q6H PRN     . pantoprazole (PROTONIX) injection 40 mg  40 mg Intravenous Q24H     . sodium chloride 0.9 % injection 3 mL  3 mL Intravenous Q12H     . tamsulosin (FLOMAX) capsule 0.4 mg  0.4 mg Oral Daily       Allergies as of 04/22/2015  . (No Known Allergies)    Family History  Problem Relation Age of Onset  . Heart disease Mother   . Dementia Neg Hx      Social History   Social History  . Marital Status: Widowed    Spouse Name: N/A  . Number of Children: 3  . Years of Education: HS   Occupational History  . Retired Other   Social History Main Topics  . Smoking status: Former Smoker    Types: Pipe    Quit date: 10/21/1982  . Smokeless tobacco: Never Used  . Alcohol Use: No  . Drug Use: No  . Sexual Activity: Not on file   Other Topics Concern  . Not on file   Social History Narrative   Patient lives at home alone.   Caffeine Use: 2 cups daily   Review of Systems: PER HPI OTHERWISE ALL  SYSTEMS ARE NEGATIVE.  Vitals: Blood pressure 138/72, pulse 64, temperature 98.7 F (37.1 C), temperature source Oral, resp. rate 20, height 5\' 10"  (1.778 m), weight 133 lb 2.5 oz (60.4 kg), SpO2 98 %.  Physical Exam: General:   Alert,  Well-developed, well-nourished, pleasant and cooperative in NAD Head:  Normocephalic and atraumatic. Eyes:  Sclera clear, no  icterus.   Conjunctiva pink. Mouth:  No lesions, dentition abnormal. Neck:  Supple; no masses. Lungs:  Clear throughout to auscultation.   No wheezes. No acute distress. Heart:  Regular rate and irregular rhythm; systolic murmur, clicks, rubs,  or gallops. Abdomen:  Soft, nontender and nondistended. No masses noted. Normal bowel sounds, without guarding, and without rebound.   Msk:  Symmetrical without gross deformities. Normal posture. Extremities:  Without edema. Neurologic:  Alert and  interactive, NO  NEW FOCAL DEFICITS Cervical Nodes:  No significant cervical adenopathy. Psych:  Alert and cooperative. Normal mood and affect.   Lab Results:  Recent Labs  04/22/15 1700 04/22/15 1947 04/23/15 0055 04/23/15 0618  WBC 8.3  --   --   --   HGB 13.0 12.3* 11.8* 12.3*  HCT 38.4* 35.6* 34.7* 36.8*  PLT 325  --   --   --    BMET  Recent Labs  04/22/15 1700 04/23/15 0618  NA 138 138  K 4.2 3.7  CL 101 103  CO2 28 28  GLUCOSE 305* 218*  BUN 35* 30*  CREATININE 1.24 1.02  CALCIUM 9.5 8.8*   LFT  Recent Labs  04/23/15 0618  PROT 6.6  ALBUMIN 3.7  AST 14*  ALT 13*  ALKPHOS 23*  BILITOT 1.1     Studies/Results: CXR: no acute process   LOS: 1 day   Mackenna Kamer  04/23/2015, 11:02 AM

## 2015-04-24 NOTE — Progress Notes (Signed)
[facility] PROGRESS NOTE  Juan LunchRalph C Pham ZOX:[id number] DOB: 1930/06/21 DOA: 04/22/2015 PCP: Cassell SmilesFUSCO,LAWRENCE J., MD  Assessment/Plan: Hematochezia -Seen by GI with plans for a flexible sigmoidoscopy: small internal hemorrhoids. -Hemoglobin remained stable without indications for transfusion at present. -Hb remains stable without need of transfusion.  Hematuria -Aspirin has been discontinued.  -UA with signs of UTI. -Start rocephin. -Urine cx sent.  Dysphagia -Daughter describes about a 2 week history of progressive dysphagia with choking when eating. -GI has planned for EGD with potential dilatation: stricture at GE junction, moderate erosive gastritis and mild duodenitis.  Insulin-dependent diabetes mellitus -Fair control, monitor CBGs and adjust regimen as needed.  Essential hypertension -We'll controlled, continue current medications.  Severe aortic stenosis -Noted careful with volume status.  Code Status: DO NOT RESUSCITATE Family Communication: Patient only. Daughter on 1/14. Disposition Plan: Home when medically ready, anticipate 24-48 hours   Consultants:  GI   Antibiotics:  None   Subjective: Demented, not oriented  Objective: Filed Vitals:   04/24/15 1230 04/24/15 1235 04/24/15 1240 04/24/15 1245  BP: 131/51 110/49 115/48 103/48  Pulse: 60 56 55 55  Temp:      TempSrc:      Resp: 13 16 15 27   Height:      Weight:      SpO2: 100% 100% 100% 100%    Intake/Output Summary (Last 24 hours) at 04/24/15 1542 Last data filed at 04/24/15 1228  Gross per 24 hour  Intake    840 ml  Output    400 ml  Net    440 ml   Filed Weights   04/22/15 1550 04/22/15 2336 04/23/15 0500  Weight: 61.689 kg (136 lb) 61.1 kg (134 lb 11.2 oz) 60.4 kg (133 lb 2.5 oz)    Exam:   General:  Awake, not oriented  Cardiovascular: Regular rate and rhythm, strong systolic ejection murmur  Respiratory: Clear to auscultation bilaterally  Abdomen: Soft,  nontender, nondistended, positive bowel sounds  Extremities: Trace bilateral pitting edema   Neurologic:  Moves all 4 spontaneously  Data Reviewed: Basic Metabolic Panel:  Recent Labs Lab 04/22/15 1700 04/23/15 0618  NA 138 138  K 4.2 3.7  CL 101 103  CO2 28 28  GLUCOSE 305* 218*  BUN 35* 30*  CREATININE 1.24 1.02  CALCIUM 9.5 8.8*   Liver Function Tests:  Recent Labs Lab 04/22/15 1700 04/23/15 0618  AST 15 14*  ALT 15* 13*  ALKPHOS 26* 23*  BILITOT 0.8 1.1  PROT 7.0 6.6  ALBUMIN 4.1 3.7   No results for input(s): LIPASE, AMYLASE in the last 168 hours. No results for input(s): AMMONIA in the last 168 hours. CBC:  Recent Labs Lab 04/22/15 1700 04/22/15 1947 04/23/15 0055 04/23/15 0618 04/24/15 0619  WBC 8.3  --   --   --  8.5  NEUTROABS 5.3  --   --   --  5.3  HGB 13.0 12.3* 11.8* 12.3* 12.5*  HCT 38.4* 35.6* 34.7* 36.8* 36.7*  MCV 90.6  --   --   --  90.0  PLT 325  --   --   --  296   Cardiac Enzymes: No results for input(s): CKTOTAL, CKMB, CKMBINDEX, TROPONINI in the last 168 hours. BNP (last 3 results)  Recent Labs  04/22/15 1700  BNP 79.0    ProBNP (last 3 results) No results for input(s): PROBNP in the last 8760 hours.  CBG:  Recent Labs Lab 04/23/15 0749 04/23/15 1210 04/23/15 1654  04/23/15 2131 04/24/15 0815  GLUCAP 196* 270* 277* 213* 206*    No results found for this or any previous visit (from the past 240 hour(s)).   Studies: No results found.  Scheduled Meds: . sodium chloride   Intravenous Once  . amLODipine  5 mg Oral Daily  . carvedilol  6.25 mg Oral BID WC  . cefTRIAXone (ROCEPHIN)  IV  1 g Intravenous Q24H  . clotrimazole  1 application Topical TID BM  . donepezil  5 mg Oral Daily  . fenofibrate  54 mg Oral Daily  . [START ON 04/25/2015] hydrochlorothiazide  12.5 mg Oral Daily  . hydrocortisone  1 application Rectal TID  . insulin aspart  0-5 Units Subcutaneous QHS  . insulin aspart  0-9 Units Subcutaneous  TID WC  . insulin glargine  10 Units Subcutaneous QHS  . memantine  5 mg Oral Daily  . pantoprazole  40 mg Oral BID AC  . sodium chloride  3 mL Intravenous Q12H  . tamsulosin  0.4 mg Oral Daily   Continuous Infusions:   Principal Problem:   Hematochezia Active Problems:   Essential hypertension   Diabetes mellitus without complication (HCC)   Severe aortic valve stenosis   Lower GI bleed   Insulin dependent diabetes mellitus (HCC)   Dysphagia, idiopathic    Time spent: 25 minutes. Greater than 50% of this time was spent in direct contact with the patient coordinating care.    Chaya Jan  [facility] Pager [phone]  If 7PM-7AM, please contact night-coverage at [email or web address], password [facility] 04/24/2015, 3:42 PM  LOS: 2 days

## 2015-04-24 NOTE — Progress Notes (Signed)
Pt complained of Chest pain. Pt stated it was a discomfort in the right lower area of his chest. Pt blood pressure 142/73 and HR 61.  MD paged. No answer. Had patient ly on his left side. Pt relieved gas and has not complained on any chest pain anymore. Lesly Dukesachel J Everett, RN

## 2015-04-24 NOTE — Op Note (Signed)
[facility] [address] [place], [id number]   FLEX SIGMOIDOSCOPY PROCEDURE REPORT  PATIENT: Juan Pham, Juan Pham  MR#: [id number] BIRTHDATE: 1930-10-06 , 84  yrs. old GENDER: male ENDOSCOPIST: West BaliSandi L Dayvin Aber, MD REFERRED AO:ZHYQMBY:Larry Sherwood GamblerFusco, M.D. PROCEDURE DATE:  04/24/2015 PROCEDURE:   Sigmoidoscopy, diagnostic INDICATIONS:hematochezia. MEDICATIONS: EGD + NONE MD INITIATED SEDATION: 1144 ENDOSCOPY COMPLETE 1233  DESCRIPTION OF PROCEDURE:    Physical exam was performed.  Informed consent was obtained from the patient after explaining the benefits, risks, and alternatives to procedure.  The patient was connected to monitor and placed in left lateral position. Continuous oxygen was provided by nasal cannula and IV medicine administered through an indwelling cannula.  After administration of sedation and rectal exam, the patients rectum was intubated and the EG-2990i ([id number]  colonoscope was advanced under direct visualization to the cecum.  The scope was removed slowly by carefully examining the color, texture, anatomy, and integrity mucosa on the way out.  The patient was recovered in endoscopy and discharged home in satisfactory condition. Estimated blood loss is zero unless otherwise noted in this procedure report.     COLON FINDINGS: NO OLD BLODO OR FRESH BLOOD SEEN IN COLON OR RECTUM, Small internal hemorrhoids were found.  , and Moderate sized external hemorrhoids were found.    PREP QUALITY: The overall prep quality was adequate.  COMPLICATIONS: None  ENDOSCOPIC IMPRESSION: 1.   RECTAL BLEEDING DUE TO Small internal hemorrhoids 2.   Moderate sized external hemorrhoids  RECOMMENDATIONS: BID PPI FOR 3 MOS THEN DAILY WHILE TAKING ASA DYSPHAGIA 2/CARB MOD DIET AWAIT BIOPSY COTNINUE ANUSOL BID FOR 14 DAYS OPV IN 3 MOS W/ DR.  Shantea Poulton      _______________________________ eSignedWest Bali:  Artesha Wemhoff L Holton Sidman, MD 04/24/2015 1:08 PM   CPT CODES: ICD CODES:  The  ICD and CPT codes recommended by this software are interpretations from the data that the clinical staff has captured with the software.  The verification of the translation of this report to the ICD and CPT codes and modifiers is the sole responsibility of the health care institution and practicing physician where this report was generated.  [employer] will not be held responsible for the validity of the ICD and CPT codes included on this report.  AMA assumes no liability for data contained or not contained herein. CPT is a Publishing rights managerregistered trademark of the [employer].

## 2015-04-24 NOTE — Op Note (Signed)
[facility] [address] [place], [id number]   ENDOSCOPY PROCEDURE REPORT  PATIENT: Juan Pham, Juan Pham  MR#: [id number] BIRTHDATE: 1931-01-21 , 84  yrs. old GENDER: male  ENDOSCOPIST: West BaliSandi L Goodwin [place], MD REFFERED XB:JYNWGBY:Larry Sherwood GamblerFusco, M.D.  PROCEDURE DATE:  04/24/2015 PROCEDURE:   EGD with dilatation over guidewire and EGD with biopsy   INDICATIONS:1.  dysphagia. MEDICATIONS: Atropine 0.5 mg IV, Demerol 25 mg IV, and Versed 4 mg IV  MD INITAITED SEDATION: 1144: ENDOSCOPY COMPLETE: 1233  TOPICAL ANESTHETIC: Viscous Xylocaine  DESCRIPTION OF PROCEDURE:   After the risks benefits and alternatives of the procedure were thoroughly explained, informed consent was obtained.  The EG-2990i ([id number]  endoscope was introduced through the mouth and advanced to the second portion of the duodenum. The instrument was slowly withdrawn as the mucosa was carefully examined.  Prior to withdrawal of the scope, the guidwire was placed.  The esophagus was dilated successfully.  The patient was recovered in endoscopy and discharged home in satisfactory condition. Estimated blood loss is zero unless otherwise noted in this procedure report.   ESOPHAGUS: A stricture was found at the gastroesophageal junction. The stenosis was traversable with the endoscope.   STOMACH: A hiatal hernia was found.   Moderate erosive gastritis (inflammation) was found in the gastric body and gastric antrum. Multiple biopsies were performed using cold forceps.   DUODENUM: Mild duodenal inflammation was found in the duodenal bulb.   The duodenal mucosa showed no abnormalities in the 2nd part of the duodenum.   Dilation was then performed at the gastroesphageal junction  Dilator: Savary over guidewire Size(s): 12.8-16 mm Resistance: minimal Heme: none  COMPLICATIONS: There were no immediate complications.  ENDOSCOPIC IMPRESSION: 1.   Stricture at the gastroesophageal junction 2.   SMALL Hiatal hernia 3.    MODERATE Erosive gastritis AND MILD DUODENITIS DUE TO ASA USE  RECOMMENDATIONS: BID PPI FOR 3 MOS THEN DAILY WHILE TAKING ASA DYSPHAGIA 2/CARB MOD DIET AWAIT BIOPSY COTNINUE ANUSOL BID FOR 14 DAYS OPV IN 3 MOS W/ DR.  Tysheka Fanguy   ______________________________ eSignedWest [place]:  Magdelena Kinsella L Kyre Jeffries, MD 04/24/2015 1:04 PM   CPT CODES: ICD CODES:  The ICD and CPT codes recommended by this software are interpretations from the data that the clinical staff has captured with the software.  The verification of the translation of this report to the ICD and CPT codes and modifiers is the sole responsibility of the health care institution and practicing physician where this report was generated.  [employer] will not be held responsible for the validity of the ICD and CPT codes included on this report.  AMA assumes no liability for data contained or not contained herein. CPT is a Publishing rights managerregistered trademark of the [employer].

## 2015-04-25 ENCOUNTER — Encounter (HOSPITAL_COMMUNITY): Payer: Self-pay | Admitting: Gastroenterology

## 2015-04-25 ENCOUNTER — Telehealth: Payer: Self-pay | Admitting: Gastroenterology

## 2015-04-25 DIAGNOSIS — R1310 Dysphagia, unspecified: Secondary | ICD-10-CM

## 2015-04-25 LAB — CBC
HCT: 34.7 % — ABNORMAL LOW (ref 39.0–52.0)
Hemoglobin: 12 g/dL — ABNORMAL LOW (ref 13.0–17.0)
MCH: 30.6 pg (ref 26.0–34.0)
MCHC: 34.6 g/dL (ref 30.0–36.0)
MCV: 88.5 fL (ref 78.0–100.0)
PLATELETS: 276 10*3/uL (ref 150–400)
RBC: 3.92 MIL/uL — ABNORMAL LOW (ref 4.22–5.81)
RDW: 12.8 % (ref 11.5–15.5)
WBC: 6.8 10*3/uL (ref 4.0–10.5)

## 2015-04-25 LAB — HEMOGLOBIN A1C
HEMOGLOBIN A1C: 10.4 % — AB (ref 4.8–5.6)
MEAN PLASMA GLUCOSE: 252 mg/dL

## 2015-04-25 MED ORDER — HYDROCORTISONE 2.5 % RE CREA: 1.0000 "application " | TOPICAL_CREAM | Freq: Three times a day (TID) | RECTAL | Status: AC

## 2015-04-25 MED ORDER — CIPROFLOXACIN HCL 500 MG PO TABS: 500.0000 mg | ORAL_TABLET | Freq: Two times a day (BID) | ORAL | Status: DC

## 2015-04-25 MED ORDER — PANTOPRAZOLE SODIUM 40 MG PO TBEC: 40.0000 mg | DELAYED_RELEASE_TABLET | Freq: Two times a day (BID) | ORAL | Status: DC

## 2015-04-25 NOTE — Progress Notes (Signed)
  REVIEWED-NO ADDITIONAL RECOMMENDATIONS.  Subjective: Believes [employer] is coming after him. Confused. Eating grits well. Tolerating breakfast. No N/V. No abdominal pain. No rectal bleeding per RN.   Objective: Vital signs in last 24 hours: Temp:  [97.8 F (36.6 C)-98.7 F (37.1 C)] 98.7 F (37.1 C) (01/16 0704) Pulse Rate:  [49-72] 64 (01/16 0704) Resp:  [13-27] 20 (01/16 0704) BP: (103-172)/(45-73) 142/64 mmHg (01/16 0704) SpO2:  [96 %-100 %] 98 % (01/16 0704) Weight:  [140 lb 6.9 oz (63.7 kg)] 140 lb 6.9 oz (63.7 kg) (01/16 0704) Last BM Date: 04/24/15 General:   Alert and oriented to person only.  Head:  Normocephalic and atraumatic. Abdomen:  Bowel sounds present, soft, non-tender, non-distended.  Neurologic:  Confused   Intake/Output from previous day: 01/15 0701 - 01/16 0700 In: 720 [P.O.:120; I.V.:600] Out: -  Intake/Output this shift:    Lab Results:  Recent Labs  04/22/15 1700  04/23/15 0055 04/23/15 0618 04/24/15 0619  WBC 8.3  --   --   --  8.5  HGB 13.0  < > 11.8* 12.3* 12.5*  HCT 38.4*  < > 34.7* 36.8* 36.7*  PLT 325  --   --   --  296  < > = values in this interval not displayed. BMET  Recent Labs  04/22/15 1700 04/23/15 0618  NA 138 138  K 4.2 3.7  CL 101 103  CO2 28 28  GLUCOSE 305* 218*  BUN 35* 30*  CREATININE 1.24 1.02  CALCIUM 9.5 8.8*   LFT  Recent Labs  04/22/15 1700 04/23/15 0618  PROT 7.0 6.6  ALBUMIN 4.1 3.7  AST 15 14*  ALT 15* 13*  ALKPHOS 26* 23*  BILITOT 0.8 1.1   PT/INR  Recent Labs  04/22/15 1700 04/23/15 0618  LABPROT 16.0* 16.5*  INR 1.27 1.32    Assessment: 82 year old male admitted with dysphagia and rectal bleeding. EGD with with stricture at the GE junction s/p Savary dilation, small hiatal hernia, moderate erosive gastritis and mild duodenitis, with rectal bleeding due to small internal hemorrhoids. Tolerating diet this morning and no overt signs of rectal bleeding.     Plan: PPI BID for 3  months then daily while on aspirin Carb modified diet Follow-up on biopsy  Anusol BID for 2 weeks  3 month follow-up as outpatient with Dr. Darrick PennaFields  Hopeful discharge soon  Nira RetortAnna W. Sams, ANP-BC [facility]    LOS: 3 days    04/25/2015, 8:15 AM

## 2015-04-25 NOTE — Telephone Encounter (Signed)
Please arrange outpatient follow-up with Dr. Darrick PennaFields in 3 months.

## 2015-04-25 NOTE — Discharge Summary (Addendum)
Physician Discharge Summary  Juan Pham:[id number] DOB: [birth year] DOA: 04/22/2015  PCP: Cassell Smiles., MD  Admit date: 04/22/2015 Discharge date: 04/25/2015  Time spent: 45 minutes  Recommendations for Outpatient Follow-up:  -Will be discharged home today. -Follow up with PCP in 2 weeks and with Dr. Darrick Penna in 3 months.   Discharge Diagnoses:  Principal Problem:   Hematochezia Active Problems:   Essential hypertension   Diabetes mellitus without complication (HCC)   Severe aortic valve stenosis   Lower GI bleed   Insulin dependent diabetes mellitus (HCC)   Dysphagia, idiopathic   Discharge Condition: Stable and improved  Filed Weights   04/22/15 2336 04/23/15 0500 04/25/15 0704  Weight: 61.1 kg (134 lb 11.2 oz) 60.4 kg (133 lb 2.5 oz) 63.7 kg (140 lb 6.9 oz)    History of present illness:  As per Dr. Antionette Char 1/13: Juan Pham is a 82 y.o. male with PMH of chronic atrial fibrillation not on anticoagulation, dementia, chronic diastolic CHF, hypertension, and insulin-dependent diabetes who presents to the ED following a frankly bloody BM at home. The patient's daughters are at the bedside and assist with the history; discussion with the ED personnel also contributes. The patient had been in his usual state of health at bedtime last night, but soon after waking on the day of admission, had a bowel movement with bright red blood seen by his daughter. The patient denied pain, lightheadedness, headache, chest pain, or palpitations. He denied nausea and there is been no vomiting. Per his family's knowledge, there is been no recent fever, chills, cough, or diarrhea. His last colonoscopy was more than 10 years ago.  In ED, patient was found to be afebrile, saturating well on room air, and with vital signs stable. Initial blood work returned with an elevated BUN to serum creatinine ratio, hyperglycemia, and a very mild normocytic anemia with hematocrit of 38.4. Digital rectal exam  reveals frank blood. Patient remained hemodynamically stable the emergency department with no further bleeding episodes and was admitted to the hospital for ongoing evaluation and management.   Hospital Course:   Hematochezia -Seen by GI  flexible sigmoidoscopy: small internal hemorrhoids that were the source of bleeding. Has been placed on anusol. -Hemoglobin remained stable without indications for transfusion at present. -Hb remains stable without need of transfusion.  Hematuria -Aspirin has been discontinued.  -UA with signs of UTI. -Continue cipro for 7 days.  Dysphagia -Daughter describes about a 2 week history of progressive dysphagia with choking when eating. -GI has planned for EGD with potential dilatation: stricture at GE junction, moderate erosive gastritis and mild duodenitis. -BID PPI for at least 3 months.  Insulin-dependent diabetes mellitus -Fair control, monitor CBGs and adjust regimen as needed.  Essential hypertension -We'll controlled, continue current medications.  Severe aortic stenosis -Noted.    Procedures:  EGD  Colonoscopy     Consultations:  GI  Discharge Instructions      Discharge Instructions    Diet - low sodium heart healthy    Complete by:  As directed      Increase activity slowly    Complete by:  As directed             Medication List    STOP taking these medications        aspirin 81 MG chewable tablet      TAKE these medications        amLODipine 10 MG tablet  Commonly known as:  NORVASC  Take 10 mg by mouth daily.     carvedilol 12.5 MG tablet  Commonly known as:  COREG  Take 0.5 tablets (6.25 mg total) by mouth 2 (two) times daily with a meal. 1/2 tablet     ciprofloxacin 500 MG tablet  Commonly known as:  CIPRO  Take 1 tablet (500 mg total) by mouth 2 (two) times daily.     donepezil 5 MG tablet  Commonly known as:  ARICEPT  Take 5 mg by mouth daily.     fenofibrate micronized 134 MG capsule    Commonly known as:  LOFIBRA  TAKE 1 BY MOUTH DAILY     hydrochlorothiazide 12.5 MG capsule  Commonly known as:  MICROZIDE  Take 1 capsule (12.5 mg total) by mouth daily.     HYDROcodone-acetaminophen 10-325 MG tablet  Commonly known as:  NORCO  Take 1 tablet by mouth every 6 (six) hours as needed for moderate pain or severe pain.     hydrocortisone 2.5 % rectal cream  Commonly known as:  ANUSOL-HC  Place 1 application rectally 3 (three) times daily.     insulin NPH-regular Human (70-30) 100 UNIT/ML injection  Commonly known as:  NOVOLIN 70/30  Take 12 units 2 times daily. If after 3 days, his blood sugar is above 200 consistently, increase the units by 2 two times daily for 5 days; if his blood sugar is still above 200, increase the units by 2 again and continue for 5 more days. Continue this regimen until his blood sugars are ranging between 120 and 160.     memantine 5 MG tablet  Commonly known as:  NAMENDA  Take 1 tablet by mouth daily.     pantoprazole 40 MG tablet  Commonly known as:  PROTONIX  Take 1 tablet (40 mg total) by mouth 2 (two) times daily before a meal.     tamsulosin 0.4 MG Caps capsule  Commonly known as:  FLOMAX  Take 1 capsule by mouth daily.       No Known Allergies Follow-up Information    Follow up with Cassell Smiles., MD. Schedule an appointment as soon as possible for a visit in 2 weeks.   Specialty:  Internal Medicine   Contact information:   [address] [place] [address] [phone]        The results of significant diagnostics from this hospitalization (including imaging, microbiology, ancillary and laboratory) are listed below for reference.    Significant Diagnostic Studies: Dg Chest 2 View  04/12/2015  CLINICAL DATA:  Chest pain, sore throat EXAM: CHEST  2 VIEW COMPARISON:  02/17/2015 FINDINGS: The heart size and mediastinal contours are within normal limits. Both lungs are clear. The visualized skeletal structures are  unremarkable. IMPRESSION: No active cardiopulmonary disease. Electronically Signed   By: Charlett Nose M.D.   On: 04/12/2015 13:08   Ct Head Wo Contrast  04/12/2015  CLINICAL DATA:  Altered mental status. EXAM: CT HEAD WITHOUT CONTRAST TECHNIQUE: Contiguous axial images were obtained from the base of the skull through the vertex without intravenous contrast. COMPARISON:  01/01/2015 MR brain.  09/17/2014 CT head. FINDINGS: No evidence for acute infarction, hemorrhage, mass lesion, or extra-axial fluid. Generalized atrophy. Chronic microvascular ischemic change of a mild to moderate nature. Vascular calcification. Calvarium intact. No sinus or mastoid disease. IMPRESSION: Atrophy and small vessel disease. No acute intracranial findings. Similar appearance to priors. Electronically Signed   By: Elsie Stain M.D.   On: 04/12/2015 13:28  Microbiology: No results found for this or any previous visit (from the past 240 hour(s)).   Labs: Basic Metabolic Panel:  Recent Labs Lab 04/22/15 1700 04/23/15 0618  NA 138 138  K 4.2 3.7  CL 101 103  CO2 28 28  GLUCOSE 305* 218*  BUN 35* 30*  CREATININE 1.24 1.02  CALCIUM 9.5 8.8*   Liver Function Tests:  Recent Labs Lab 04/22/15 1700 04/23/15 0618  AST 15 14*  ALT 15* 13*  ALKPHOS 26* 23*  BILITOT 0.8 1.1  PROT 7.0 6.6  ALBUMIN 4.1 3.7   No results for input(s): LIPASE, AMYLASE in the last 168 hours. No results for input(s): AMMONIA in the last 168 hours. CBC:  Recent Labs Lab 04/22/15 1700 04/22/15 1947 04/23/15 0055 04/23/15 0618 04/24/15 0619 04/25/15 0857  WBC 8.3  --   --   --  8.5 6.8  NEUTROABS 5.3  --   --   --  5.3  --   HGB 13.0 12.3* 11.8* 12.3* 12.5* 12.0*  HCT 38.4* 35.6* 34.7* 36.8* 36.7* 34.7*  MCV 90.6  --   --   --  90.0 88.5  PLT 325  --   --   --  296 276   Cardiac Enzymes: No results for input(s): CKTOTAL, CKMB, CKMBINDEX, TROPONINI in the last 168 hours. BNP: BNP (last 3 results)  Recent Labs   04/22/15 1700  BNP 79.0    ProBNP (last 3 results) No results for input(s): PROBNP in the last 8760 hours.  CBG:  Recent Labs Lab 04/23/15 1654 04/23/15 2131 04/24/15 0815 04/24/15 1639 04/24/15 2123  GLUCAP 277* 213* 206* 211* 380*       Signed:  HERNANDEZ ACOSTA,ESTELA  [facility] Pager: [phone] 04/25/2015, 11:37 AM

## 2015-04-25 NOTE — Care Management Important Message (Signed)
Important Message  Patient Details  Name: Juan Pham MRN: [id number] Date of Birth: [birth year]   Medicare Important Message Given:  Yes    Adonis HugueninBerkhead, Lenoria Narine L, RN 04/25/2015, 12:44 PM

## 2015-04-25 NOTE — Progress Notes (Signed)
Discharge instructions read to patient hand his family.  All questions answered to family satisfaction.  Pt discharged to home with family

## 2015-04-25 NOTE — Progress Notes (Signed)
Inpatient Diabetes Program Recommendations  AACE/ADA: New Consensus Statement on Inpatient Glycemic Control (2015)  Target Ranges:  Prepandial:   less than 140 mg/dL      Peak postprandial:   less than 180 mg/dL (1-2 hours)      Critically ill patients:  140 - 180 mg/dL   Review of Glycemic Control  Diabetes history: DM 2 Outpatient Diabetes medications: NPH 12 units bid (adjustments depending on glucose levels) Current orders for Inpatient glycemic control: Lantus 10 units and sensitive correction q 4 hrs  Inpatient Diabetes Program Recommendations: Glucose running in 200's to 300's. Please consider increase in correction to moderate q 4 hrs.  Thank you Lenor CoffinAnn Cavon Nicolls, RN, MSN, CDE  Diabetes Inpatient Program Office: [phone] Pager: [phone] 8:00 am to 5:00 pm

## 2015-04-26 ENCOUNTER — Emergency Department (HOSPITAL_COMMUNITY)
Admission: EM | Admit: 2015-04-26 | Discharge: 2015-04-26 | Disposition: A | Discharge status: Home / Self Care | Payer: PPO | Attending: Emergency Medicine | Admitting: Emergency Medicine

## 2015-04-26 ENCOUNTER — Emergency Department (HOSPITAL_COMMUNITY): Payer: PPO

## 2015-04-26 ENCOUNTER — Encounter (HOSPITAL_COMMUNITY): Payer: Self-pay

## 2015-04-26 DIAGNOSIS — Z7952 Long term (current) use of systemic steroids: Secondary | ICD-10-CM | POA: Diagnosis not present

## 2015-04-26 DIAGNOSIS — Z87891 Personal history of nicotine dependence: Secondary | ICD-10-CM | POA: Diagnosis not present

## 2015-04-26 DIAGNOSIS — R1011 Right upper quadrant pain: Secondary | ICD-10-CM | POA: Diagnosis not present

## 2015-04-26 DIAGNOSIS — F0390 Unspecified dementia without behavioral disturbance: Secondary | ICD-10-CM | POA: Insufficient documentation

## 2015-04-26 DIAGNOSIS — Z79899 Other long term (current) drug therapy: Secondary | ICD-10-CM | POA: Insufficient documentation

## 2015-04-26 DIAGNOSIS — Z794 Long term (current) use of insulin: Secondary | ICD-10-CM | POA: Diagnosis not present

## 2015-04-26 DIAGNOSIS — E119 Type 2 diabetes mellitus without complications: Secondary | ICD-10-CM | POA: Diagnosis not present

## 2015-04-26 DIAGNOSIS — R1031 Right lower quadrant pain: Secondary | ICD-10-CM | POA: Insufficient documentation

## 2015-04-26 DIAGNOSIS — R112 Nausea with vomiting, unspecified: Secondary | ICD-10-CM | POA: Insufficient documentation

## 2015-04-26 DIAGNOSIS — I4891 Unspecified atrial fibrillation: Secondary | ICD-10-CM | POA: Insufficient documentation

## 2015-04-26 DIAGNOSIS — Z792 Long term (current) use of antibiotics: Secondary | ICD-10-CM | POA: Diagnosis not present

## 2015-04-26 DIAGNOSIS — R011 Cardiac murmur, unspecified: Secondary | ICD-10-CM | POA: Diagnosis not present

## 2015-04-26 DIAGNOSIS — R519 Headache, unspecified: Secondary | ICD-10-CM

## 2015-04-26 DIAGNOSIS — R51 Headache: Secondary | ICD-10-CM | POA: Diagnosis not present

## 2015-04-26 DIAGNOSIS — I10 Essential (primary) hypertension: Secondary | ICD-10-CM | POA: Diagnosis not present

## 2015-04-26 LAB — COMPREHENSIVE METABOLIC PANEL
ALT: 14 U/L — ABNORMAL LOW (ref 17–63)
AST: 16 U/L (ref 15–41)
Albumin: 4 g/dL (ref 3.5–5.0)
Alkaline Phosphatase: 28 U/L — ABNORMAL LOW (ref 38–126)
Anion gap: 10 (ref 5–15)
BUN: 21 mg/dL — ABNORMAL HIGH (ref 6–20)
CO2: 25 mmol/L (ref 22–32)
Calcium: 9.3 mg/dL (ref 8.9–10.3)
Chloride: 100 mmol/L — ABNORMAL LOW (ref 101–111)
Creatinine, Ser: 1.11 mg/dL (ref 0.61–1.24)
GFR calc Af Amer: >60 mL/min (ref >60)
GFR calc non Af Amer: 60 mL/min — ABNORMAL LOW (ref >60)
Glucose, Bld: 320 mg/dL — ABNORMAL HIGH (ref 65–99)
Potassium: 4 mmol/L (ref 3.5–5.1)
Sodium: 135 mmol/L (ref 135–145)
Total Bilirubin: 0.9 mg/dL (ref 0.3–1.2)
Total Protein: 7.1 g/dL (ref 6.5–8.1)

## 2015-04-26 LAB — URINALYSIS, ROUTINE W REFLEX MICROSCOPIC
Bilirubin Urine: NEGATIVE
Glucose, UA: >1000 mg/dL — AB
Ketones, ur: NEGATIVE mg/dL
Leukocytes, UA: NEGATIVE
Nitrite: NEGATIVE
Specific Gravity, Urine: 1.015 (ref 1.005–1.030)
pH: 6 (ref 5.0–8.0)

## 2015-04-26 LAB — URINE CULTURE

## 2015-04-26 LAB — CBC
HCT: 38.9 % — ABNORMAL LOW (ref 39.0–52.0)
Hemoglobin: 13.1 g/dL (ref 13.0–17.0)
MCH: 30.2 pg (ref 26.0–34.0)
MCHC: 33.7 g/dL (ref 30.0–36.0)
MCV: 89.6 fL (ref 78.0–100.0)
Platelets: 289 10*3/uL (ref 150–400)
RBC: 4.34 MIL/uL (ref 4.22–5.81)
RDW: 12.8 % (ref 11.5–15.5)
WBC: 8.2 10*3/uL (ref 4.0–10.5)

## 2015-04-26 LAB — URINE MICROSCOPIC-ADD ON: Bacteria, UA: NONE SEEN

## 2015-04-26 LAB — LIPASE, BLOOD: Lipase: 16 U/L (ref 11–51)

## 2015-04-26 MED ORDER — IOHEXOL 300 MG/ML  SOLN
100.0000 mL | Freq: Once | INTRAMUSCULAR | Status: AC | PRN
  Administered 2015-04-26: 100 mL via INTRAVENOUS

## 2015-04-26 MED ORDER — SODIUM CHLORIDE 0.9 % IV SOLN
INTRAVENOUS | Status: DC
  Administered 2015-04-26: 10:00:00 via INTRAVENOUS

## 2015-04-26 MED ORDER — IOHEXOL 300 MG/ML  SOLN
25.0000 mL | Freq: Once | INTRAMUSCULAR | Status: AC | PRN
  Administered 2015-04-26: 25 mL via ORAL

## 2015-04-26 MED ORDER — ONDANSETRON HCL 4 MG/2ML IJ SOLN
4.0000 mg | Freq: Once | INTRAMUSCULAR | Status: AC
  Administered 2015-04-26: 4 mg via INTRAVENOUS
  Filled 2015-04-26: qty 2

## 2015-04-26 NOTE — ED Notes (Signed)
MD at bedside. 

## 2015-04-26 NOTE — ED Notes (Signed)
Daughter reports pt was admitted Friday and had surgery Sunday and discharged yesterday.  Reports had esophagus stretched, an obstruction removed, and removed some hemorrhoids.  Per family, pt ate without difficulty while in the hospital and last night but started vomiting this morning after eating.  Reports severe headache that started after vomiting.
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
## 2015-04-26 NOTE — Discharge Instructions (Signed)

## 2015-04-28 ENCOUNTER — Encounter (HOSPITAL_COMMUNITY): Payer: Self-pay | Admitting: *Deleted

## 2015-04-28 ENCOUNTER — Emergency Department (HOSPITAL_COMMUNITY): Payer: PPO

## 2015-04-28 ENCOUNTER — Observation Stay (HOSPITAL_COMMUNITY)
Admission: EM | Admit: 2015-04-28 | Discharge: 2015-04-30 | Disposition: A | Discharge status: Home / Self Care | Payer: PPO | Attending: Internal Medicine | Admitting: Internal Medicine

## 2015-04-28 ENCOUNTER — Telehealth: Payer: Self-pay | Admitting: General Practice

## 2015-04-28 DIAGNOSIS — Z87891 Personal history of nicotine dependence: Secondary | ICD-10-CM | POA: Insufficient documentation

## 2015-04-28 DIAGNOSIS — R1310 Dysphagia, unspecified: Secondary | ICD-10-CM

## 2015-04-28 DIAGNOSIS — F0390 Unspecified dementia without behavioral disturbance: Secondary | ICD-10-CM | POA: Diagnosis present

## 2015-04-28 DIAGNOSIS — I350 Nonrheumatic aortic (valve) stenosis: Secondary | ICD-10-CM

## 2015-04-28 DIAGNOSIS — I10 Essential (primary) hypertension: Secondary | ICD-10-CM | POA: Diagnosis present

## 2015-04-28 DIAGNOSIS — Z794 Long term (current) use of insulin: Secondary | ICD-10-CM | POA: Diagnosis not present

## 2015-04-28 DIAGNOSIS — I519 Heart disease, unspecified: Secondary | ICD-10-CM | POA: Insufficient documentation

## 2015-04-28 DIAGNOSIS — E119 Type 2 diabetes mellitus without complications: Secondary | ICD-10-CM | POA: Diagnosis not present

## 2015-04-28 DIAGNOSIS — R319 Hematuria, unspecified: Secondary | ICD-10-CM | POA: Diagnosis present

## 2015-04-28 DIAGNOSIS — E785 Hyperlipidemia, unspecified: Secondary | ICD-10-CM | POA: Insufficient documentation

## 2015-04-28 DIAGNOSIS — I4891 Unspecified atrial fibrillation: Secondary | ICD-10-CM | POA: Diagnosis not present

## 2015-04-28 DIAGNOSIS — K625 Hemorrhage of anus and rectum: Principal | ICD-10-CM | POA: Insufficient documentation

## 2015-04-28 DIAGNOSIS — L8990 Pressure ulcer of unspecified site, unspecified stage: Secondary | ICD-10-CM | POA: Insufficient documentation

## 2015-04-28 DIAGNOSIS — Z79899 Other long term (current) drug therapy: Secondary | ICD-10-CM | POA: Diagnosis not present

## 2015-04-28 DIAGNOSIS — E860 Dehydration: Secondary | ICD-10-CM | POA: Insufficient documentation

## 2015-04-28 LAB — GLUCOSE, CAPILLARY: GLUCOSE-CAPILLARY: 204 mg/dL — AB (ref 65–99)

## 2015-04-28 LAB — COMPREHENSIVE METABOLIC PANEL
ALBUMIN: 3.8 g/dL (ref 3.5–5.0)
ALK PHOS: 27 U/L — AB (ref 38–126)
ALT: 13 U/L — ABNORMAL LOW (ref 17–63)
AST: 17 U/L (ref 15–41)
Anion gap: 8 (ref 5–15)
BILIRUBIN TOTAL: 0.9 mg/dL (ref 0.3–1.2)
BUN: 27 mg/dL — AB (ref 6–20)
CALCIUM: 9 mg/dL (ref 8.9–10.3)
CO2: 28 mmol/L (ref 22–32)
Chloride: 97 mmol/L — ABNORMAL LOW (ref 101–111)
Creatinine, Ser: 1.3 mg/dL — ABNORMAL HIGH (ref 0.61–1.24)
GFR calc Af Amer: 57 mL/min — ABNORMAL LOW (ref >60)
GFR calc non Af Amer: 49 mL/min — ABNORMAL LOW (ref >60)
GLUCOSE: 281 mg/dL — AB (ref 65–99)
Potassium: 4 mmol/L (ref 3.5–5.1)
Sodium: 133 mmol/L — ABNORMAL LOW (ref 135–145)
TOTAL PROTEIN: 6.7 g/dL (ref 6.5–8.1)

## 2015-04-28 LAB — URINALYSIS, ROUTINE W REFLEX MICROSCOPIC
GLUCOSE, UA: 500 mg/dL — AB
Leukocytes, UA: NEGATIVE
Nitrite: NEGATIVE
PROTEIN: 100 mg/dL — AB
Specific Gravity, Urine: >1.03 — ABNORMAL HIGH (ref 1.005–1.030)
pH: 6.5 (ref 5.0–8.0)

## 2015-04-28 LAB — URINE MICROSCOPIC-ADD ON

## 2015-04-28 LAB — CBC WITH DIFFERENTIAL/PLATELET
Basophils Absolute: 0.1 10*3/uL (ref 0.0–0.1)
Basophils Relative: 1 %
EOS ABS: 0.1 10*3/uL (ref 0.0–0.7)
EOS PCT: 1 %
HCT: 37.4 % — ABNORMAL LOW (ref 39.0–52.0)
Hemoglobin: 12.7 g/dL — ABNORMAL LOW (ref 13.0–17.0)
LYMPHS ABS: 0.9 10*3/uL (ref 0.7–4.0)
LYMPHS PCT: 12 %
MCH: 30.3 pg (ref 26.0–34.0)
MCHC: 34 g/dL (ref 30.0–36.0)
MCV: 89.3 fL (ref 78.0–100.0)
MONO ABS: 0.8 10*3/uL (ref 0.1–1.0)
Monocytes Relative: 10 %
Neutro Abs: 6.1 10*3/uL (ref 1.7–7.7)
Neutrophils Relative %: 76 %
PLATELETS: 266 10*3/uL (ref 150–400)
RBC: 4.19 MIL/uL — AB (ref 4.22–5.81)
RDW: 12.9 % (ref 11.5–15.5)
WBC: 7.9 10*3/uL (ref 4.0–10.5)

## 2015-04-28 LAB — PROTIME-INR
INR: 1.24 (ref 0.00–1.49)
Prothrombin Time: 15.7 seconds — ABNORMAL HIGH (ref 11.6–15.2)

## 2015-04-28 LAB — LIPASE, BLOOD: LIPASE: 14 U/L (ref 11–51)

## 2015-04-28 MED ORDER — SODIUM CHLORIDE 0.9 % IV BOLUS (SEPSIS)
250.0000 mL | Freq: Once | INTRAVENOUS | Status: AC
  Administered 2015-04-28: 250 mL via INTRAVENOUS

## 2015-04-28 MED ORDER — ALBUTEROL SULFATE (2.5 MG/3ML) 0.083% IN NEBU
2.5000 mg | INHALATION_SOLUTION | RESPIRATORY_TRACT | Status: DC | PRN
  Filled 2015-04-28: qty 3

## 2015-04-28 MED ORDER — ONDANSETRON HCL 4 MG PO TABS: 4.0000 mg | ORAL_TABLET | Freq: Four times a day (QID) | ORAL | Status: DC | PRN

## 2015-04-28 MED ORDER — SODIUM CHLORIDE 0.9 % IV SOLN
INTRAVENOUS | Status: DC
  Administered 2015-04-28: 1000 mL via INTRAVENOUS

## 2015-04-28 MED ORDER — SODIUM CHLORIDE 0.45 % IV SOLN
INTRAVENOUS | Status: AC
  Administered 2015-04-28: 21:00:00 via INTRAVENOUS

## 2015-04-28 MED ORDER — DONEPEZIL HCL 5 MG PO TABS
5.0000 mg | ORAL_TABLET | Freq: Every day | ORAL | Status: DC
  Administered 2015-04-28 – 2015-04-29 (×2): 5 mg via ORAL
  Filled 2015-04-28 (×2): qty 1

## 2015-04-28 MED ORDER — MEMANTINE HCL 10 MG PO TABS
5.0000 mg | ORAL_TABLET | Freq: Every day | ORAL | Status: DC
  Administered 2015-04-29 – 2015-04-30 (×2): 5 mg via ORAL
  Filled 2015-04-28 (×2): qty 1

## 2015-04-28 MED ORDER — ACETAMINOPHEN 325 MG PO TABS: 650.0000 mg | ORAL_TABLET | Freq: Four times a day (QID) | ORAL | Status: DC | PRN

## 2015-04-28 MED ORDER — ONDANSETRON HCL 4 MG/2ML IJ SOLN
4.0000 mg | Freq: Four times a day (QID) | INTRAMUSCULAR | Status: DC | PRN
  Administered 2015-04-28: 4 mg via INTRAVENOUS
  Filled 2015-04-28: qty 2

## 2015-04-28 MED ORDER — INSULIN ASPART 100 UNIT/ML ~~LOC~~ SOLN: 0.0000 [IU] | Freq: Every day | SUBCUTANEOUS | Status: DC

## 2015-04-28 MED ORDER — PANTOPRAZOLE SODIUM 40 MG PO TBEC
40.0000 mg | DELAYED_RELEASE_TABLET | Freq: Two times a day (BID) | ORAL | Status: DC
  Administered 2015-04-29 – 2015-04-30 (×3): 40 mg via ORAL
  Filled 2015-04-28 (×3): qty 1

## 2015-04-28 MED ORDER — CARVEDILOL 3.125 MG PO TABS
6.2500 mg | ORAL_TABLET | Freq: Two times a day (BID) | ORAL | Status: DC
  Administered 2015-04-29 – 2015-04-30 (×3): 6.25 mg via ORAL
  Filled 2015-04-28 (×3): qty 2

## 2015-04-28 MED ORDER — ACETAMINOPHEN 650 MG RE SUPP: 650.0000 mg | Freq: Four times a day (QID) | RECTAL | Status: DC | PRN

## 2015-04-28 MED ORDER — INSULIN ASPART 100 UNIT/ML ~~LOC~~ SOLN
0.0000 [IU] | Freq: Three times a day (TID) | SUBCUTANEOUS | Status: DC
  Administered 2015-04-29: 2 [IU] via SUBCUTANEOUS
  Administered 2015-04-29: 11 [IU] via SUBCUTANEOUS
  Administered 2015-04-30: 5 [IU] via SUBCUTANEOUS

## 2015-04-28 MED ORDER — INSULIN ASPART PROT & ASPART (70-30 MIX) 100 UNIT/ML ~~LOC~~ SUSP
10.0000 [IU] | Freq: Two times a day (BID) | SUBCUTANEOUS | Status: DC
  Administered 2015-04-29 – 2015-04-30 (×3): 10 [IU] via SUBCUTANEOUS
  Filled 2015-04-28: qty 10

## 2015-04-28 MED ORDER — DOCUSATE SODIUM 100 MG PO CAPS
100.0000 mg | ORAL_CAPSULE | Freq: Two times a day (BID) | ORAL | Status: DC
  Administered 2015-04-28 – 2015-04-30 (×4): 100 mg via ORAL
  Filled 2015-04-28 (×4): qty 1

## 2015-04-28 MED ORDER — DEXTROSE 5 % IV SOLN
1.0000 g | INTRAVENOUS | Status: DC
  Administered 2015-04-29: 1 g via INTRAVENOUS
  Filled 2015-04-28 (×3): qty 10

## 2015-04-28 MED ORDER — DEXTROSE 5 % IV SOLN
1.0000 g | Freq: Once | INTRAVENOUS | Status: AC
  Administered 2015-04-28: 1 g via INTRAVENOUS
  Filled 2015-04-28: qty 10

## 2015-04-28 MED ORDER — OXYCODONE HCL 5 MG PO TABS
5.0000 mg | ORAL_TABLET | ORAL | Status: DC | PRN
  Administered 2015-04-28 – 2015-04-29 (×3): 5 mg via ORAL
  Filled 2015-04-28 (×3): qty 1

## 2015-04-28 MED ORDER — TAMSULOSIN HCL 0.4 MG PO CAPS
0.4000 mg | ORAL_CAPSULE | Freq: Every day | ORAL | Status: DC
  Administered 2015-04-29 – 2015-04-30 (×2): 0.4 mg via ORAL
  Filled 2015-04-28 (×2): qty 1

## 2015-04-28 MED ORDER — HYDROCORTISONE 2.5 % RE CREA
1.0000 "application " | TOPICAL_CREAM | Freq: Three times a day (TID) | RECTAL | Status: DC
  Administered 2015-04-29 – 2015-04-30 (×4): 1 via RECTAL
  Filled 2015-04-28: qty 28.35

## 2015-04-28 NOTE — Telephone Encounter (Signed)
PLEASE CALL PT'S DAUGHTER. He had a blood count in the ED 13 and IT is stable. IF SHE IS CONERNED ABOUT WEAKNESS AND THE RECTAL BLEEDING IS GETTING WORSE, SHE NEEDS TO TAKE HIM BACK TO THE ED. OTHERWISE, WE CAN SCHEDULE HIM FOR A COLONOSCOPY TO EVALUATE THE RECTAL BLEEDING. HE SHOULD HOLD THE ASPIRIN AND CONTINUE HIS ANUSOL TWICE DAILY FOR 14 DAYS.

## 2015-04-28 NOTE — ED Provider Notes (Addendum)
CSN: [id number]     Arrival date & time 04/28/15  1306 History   First MD Initiated Contact with Patient 04/28/15 1323     Chief Complaint  Patient presents with  . Rectal Bleeding     (Consider location/radiation/quality/duration/timing/severity/associated sxs/prior Treatment) Patient is a 82 y.o. male presenting with hematochezia. The history is provided by a relative.  Rectal Bleeding Quality:  Bright red  patient lives with the daughter. Patient with several recent visits to the emergency department admissions this month. On January 3 was admitted for altered mental status and chest pain. On January 13 was admitted for rectal bleeding. On January 17 was evaluated in the emergency department for vomiting.  During the last hospitalization patient had a flexible sigmoidoscopy which showed evidence of internal hemorrhoids a presumed bleeding practically had come from that. Global hematocrit remained stable.  There was question of a urinary tract infection and patient was discharged home on Cipro. Patient's been having trouble taking the Cipro on a regular basis because there have been episodes of vomiting.  Patient's daughter is comfortable with taking care of him at home.  Patient has past medical history is significant for hypertension type 2 diabetes dementia. Recent rectal bleeding. Aortic stenosis and atrial fibrillation.    Past Medical History  Diagnosis Date  . Hypertension   . Hyperlipidemia   . Bradycardia   . Diabetes mellitus without complication (HCC)     type II  . Aortic stenosis   . Diastolic dysfunction     grade 1  . Dementia   . Atrial fibrillation (HCC)   . 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 Cateract    . Retna    . Cardiac catheterization N/A 12/22/2014    Procedure: Right/Left Heart Cath and Coronary Angiography;  Surgeon: Lennette Bihari, MD;  Location: [facility] INVASIVE CV LAB;  Service:  Cardiovascular;  Laterality: N/A;  . Esophagogastroduodenoscopy N/A 04/24/2015    Procedure: ESOPHAGOGASTRODUODENOSCOPY (EGD);  Surgeon: West Bali, MD;  Location: [facility] ENDO SUITE;  Service: Endoscopy;  Laterality: N/A;  . Esophageal dilation N/A 04/24/2015    Procedure: ESOPHAGEAL DILATION;  Surgeon: West Bali, MD;  Location: [facility] ENDO SUITE;  Service: Endoscopy;  Laterality: N/A;  . Flexible sigmoidoscopy N/A 04/24/2015    Procedure: FLEXIBLE SIGMOIDOSCOPY;  Surgeon: West Bali, MD;  Location: [facility] ENDO SUITE;  Service: Endoscopy;  Laterality: N/A;   Family History  Problem Relation Age of Onset  . Heart disease Mother   . Dementia Neg Hx    Social History  Substance Use Topics  . Smoking status: Former Smoker    Types: Pipe    Quit date: 10/21/1982  . Smokeless tobacco: Never Used  . Alcohol Use: No    Review of Systems  Unable to perform ROS: Dementia  Gastrointestinal: Positive for hematochezia.      Allergies  Review of patient's allergies indicates no known allergies.  Home Medications   Prior to Admission medications   Medication Sig Start Date End Date Taking? Authorizing Provider  amLODipine (NORVASC) 10 MG tablet Take 10 mg by mouth daily.   Yes Historical Provider, MD  carvedilol (COREG) 12.5 MG tablet Take 0.5 tablets (6.25 mg total) by mouth 2 (two) times daily with a meal. 1/2 tablet 02/01/14  Yes Lennette Bihari, MD  ciprofloxacin (CIPRO) 500 MG tablet Take 1 tablet (500 mg total) by mouth 2 (two) times daily. 04/25/15  Yes Estela  Isaiah Blakes, MD  donepezil (ARICEPT) 5 MG tablet Take 5 mg by mouth daily. 11/23/14  Yes Historical Provider, MD  fenofibrate micronized (LOFIBRA) 134 MG capsule TAKE 1 BY MOUTH DAILY 03/22/14  Yes Lennette Bihari, MD  hydrochlorothiazide (MICROZIDE) 12.5 MG capsule Take 1 capsule (12.5 mg total) by mouth daily. 12/16/14  Yes Lennette Bihari, MD  HYDROcodone-acetaminophen (NORCO) 10-325 MG tablet Take 1 tablet by mouth every 6  (six) hours as needed for moderate pain or severe pain.  01/04/15  Yes Historical Provider, MD  hydrocortisone (ANUSOL-HC) 2.5 % rectal cream Place 1 application rectally 3 (three) times daily. 04/25/15  Yes Estela Isaiah Blakes, MD  insulin NPH-regular Human (NOVOLIN 70/30) (70-30) 100 UNIT/ML injection Take 12 units 2 times daily. If after 3 days, his blood sugar is above 200 consistently, increase the units by 2 two times daily for 5 days; if his blood sugar is still above 200, increase the units by 2 again and continue for 5 more days. Continue this regimen until his blood sugars are ranging between 120 and 160. 02/18/15  Yes Elliot Cousin, MD  memantine (NAMENDA) 5 MG tablet Take 1 tablet by mouth daily. 01/04/15  Yes Historical Provider, MD  pantoprazole (PROTONIX) 40 MG tablet Take 1 tablet (40 mg total) by mouth 2 (two) times daily before a meal. 04/25/15  Yes Estela Isaiah Blakes, MD  tamsulosin (FLOMAX) 0.4 MG CAPS capsule Take 1 capsule by mouth daily. 12/28/14  Yes Historical Provider, MD   BP 151/57 mmHg  Pulse 58  Temp(Src) 98 F (36.7 C) (Temporal)  Resp 18  SpO2 100% Physical Exam  Constitutional: He appears well-developed and well-nourished. No distress.  HENT:  Head: Normocephalic and atraumatic.  Mouth/Throat: Oropharynx is clear and moist.  Eyes: Conjunctivae and EOM are normal. Pupils are equal, round, and reactive to light.  Neck: Normal range of motion.  Cardiovascular: Normal rate and regular rhythm.   Pulmonary/Chest: Effort normal and breath sounds normal. No respiratory distress.  Abdominal: Soft. Bowel sounds are normal. There is no tenderness.  Genitourinary: Penis normal.  Rectal exam with brown stool no gross blood. No melena and hematochezia.  Musculoskeletal: Normal range of motion. He exhibits no edema.  Neurological: He is alert. No cranial nerve deficit. He exhibits normal muscle tone. Coordination normal.  Skin: Skin is warm. No rash noted.     ED Course  Procedures (including critical care time) Labs Review Labs Reviewed  COMPREHENSIVE METABOLIC PANEL - Abnormal; Notable for the following:    Sodium 133 (*)    Chloride 97 (*)    Glucose, Bld 281 (*)    BUN 27 (*)    Creatinine, Ser 1.30 (*)    ALT 13 (*)    Alkaline Phosphatase 27 (*)    GFR calc non Af Amer 49 (*)    GFR calc Af Amer 57 (*)    All other components within normal limits  PROTIME-INR - Abnormal; Notable for the following:    Prothrombin Time 15.7 (*)    All other components within normal limits  CBC WITH DIFFERENTIAL/PLATELET - Abnormal; Notable for the following:    RBC 4.19 (*)    Hemoglobin 12.7 (*)    HCT 37.4 (*)    All other components within normal limits  URINALYSIS, ROUTINE W REFLEX MICROSCOPIC (NOT AT [facility]) - Abnormal; Notable for the following:    Color, Urine BROWN (*)    APPearance HAZY (*)    Specific  Gravity, Urine >1.030 (*)    Glucose, UA 500 (*)    Hgb urine dipstick LARGE (*)    Bilirubin Urine SMALL (*)    Ketones, ur TRACE (*)    Protein, ur 100 (*)    All other components within normal limits  URINE MICROSCOPIC-ADD ON - Abnormal; Notable for the following:    Squamous Epithelial / LPF 0-5 (*)    Bacteria, UA FEW (*)    All other components within normal limits  URINE CULTURE  LIPASE, BLOOD   Results for orders placed or performed during the hospital encounter of 04/28/15  Comprehensive metabolic panel  Result Value Ref Range   Sodium 133 (L) 135 - 145 mmol/L   Potassium 4.0 3.5 - 5.1 mmol/L   Chloride 97 (L) 101 - 111 mmol/L   CO2 28 22 - 32 mmol/L   Glucose, Bld 281 (H) 65 - 99 mg/dL   BUN 27 (H) 6 - 20 mg/dL   Creatinine, Ser 1.61 (H) 0.61 - 1.24 mg/dL   Calcium 9.0 8.9 - 09.6 mg/dL   Total Protein 6.7 6.5 - 8.1 g/dL   Albumin 3.8 3.5 - 5.0 g/dL   AST 17 15 - 41 U/L   ALT 13 (L) 17 - 63 U/L   Alkaline Phosphatase 27 (L) 38 - 126 U/L   Total Bilirubin 0.9 0.3 - 1.2 mg/dL   GFR calc non Af Amer 49 (L) >60  mL/min   GFR calc Af Amer 57 (L) >60 mL/min   Anion gap 8 5 - 15  Lipase, blood  Result Value Ref Range   Lipase 14 11 - 51 U/L  Protime-INR  Result Value Ref Range   Prothrombin Time 15.7 (H) 11.6 - 15.2 seconds   INR 1.24 0.00 - 1.49  CBC with Differential/Platelet  Result Value Ref Range   WBC 7.9 4.0 - 10.5 K/uL   RBC 4.19 (L) 4.22 - 5.81 MIL/uL   Hemoglobin 12.7 (L) 13.0 - 17.0 g/dL   HCT 04.5 (L) 40.9 - 81.1 %   MCV 89.3 78.0 - 100.0 fL   MCH 30.3 26.0 - 34.0 pg   MCHC 34.0 30.0 - 36.0 g/dL   RDW 91.4 78.2 - 95.6 %   Platelets 266 150 - 400 K/uL   Neutrophils Relative % 76 %   Neutro Abs 6.1 1.7 - 7.7 K/uL   Lymphocytes Relative 12 %   Lymphs Abs 0.9 0.7 - 4.0 K/uL   Monocytes Relative 10 %   Monocytes Absolute 0.8 0.1 - 1.0 K/uL   Eosinophils Relative 1 %   Eosinophils Absolute 0.1 0.0 - 0.7 K/uL   Basophils Relative 1 %   Basophils Absolute 0.1 0.0 - 0.1 K/uL  Urinalysis, Routine w reflex microscopic (not at [facility])  Result Value Ref Range   Color, Urine BROWN (A) YELLOW   APPearance HAZY (A) CLEAR   Specific Gravity, Urine >1.030 (H) 1.005 - 1.030   pH 6.5 5.0 - 8.0   Glucose, UA 500 (A) NEGATIVE mg/dL   Hgb urine dipstick LARGE (A) NEGATIVE   Bilirubin Urine SMALL (A) NEGATIVE   Ketones, ur TRACE (A) NEGATIVE mg/dL   Protein, ur 213 (A) NEGATIVE mg/dL   Nitrite NEGATIVE NEGATIVE   Leukocytes, UA NEGATIVE NEGATIVE  Urine microscopic-add on  Result Value Ref Range   Squamous Epithelial / LPF 0-5 (A) NONE SEEN   WBC, UA 0-5 0 - 5 WBC/hpf   RBC / HPF TOO NUMEROUS TO COUNT 0 -  5 RBC/hpf   Bacteria, UA FEW (A) NONE SEEN     Imaging Review Dg Chest 2 View  04/28/2015  CLINICAL DATA:  Gross hematuria, generalized abdominal pain. EXAM: CHEST  2 VIEW COMPARISON:  April 12, 2015. FINDINGS: The heart size and mediastinal contours are within normal limits. No pneumothorax or pleural effusion is noted. Minimal bibasilar subsegmental atelectasis is noted secondary to  hypoinflation of the lungs. The visualized skeletal structures are unremarkable. IMPRESSION: Minimal bibasilar subsegmental atelectasis secondary to hypoinflation of the lungs. Electronically Signed   By: Lupita Raider, M.D.   On: 04/28/2015 14:31   I have personally reviewed and evaluated these images and lab results as part of my medical decision-making.   EKG Interpretation   Date/Time:  Thursday April 28 2015 13:44:56 EST Ventricular Rate:  55 PR Interval:  171 QRS Duration: 93 QT Interval:  467 QTC Calculation: 447 R Axis:   75 Text Interpretation:  Sinus rhythm Borderline T abnormalities, lateral  leads Baseline wander in lead(s) V2 V5 No significant change since last  tracing Confirmed by Shevelle Smither  MD, Danilyn Cocke [phone]) on 04/28/2015 1:56:51 PM      MDM   Final diagnoses:  Hematuria  Dehydration  Rectal bleeding    Patient with a history of red blood per rectum this morning. Rectal exam here just has brown stool. No gross blood. No melena. Patient's urinalysis has significant hematuria. Possibility of urinary tract infection is still present even though previous cultures had shown evidence of contamination. Will treat with IV Rocephin. Patient showing some evidence both clinically and by labs of dehydration. We'll discuss with hospitalist regarding admission. Urine culture sent. Would recommend hydration and IV antibiotics for the possible urinary tract infection or the hematuria. Patient showing increased weakness at home. Patient unable to keep the Cipro down due to problems with vomiting.    Vanetta Mulders, MD 04/28/15 1712   There was some discussion family members wanted the patient to go home. We are going to give him little more fluid. Patient did receive 1 g of Rocephin here. Patient went on to have a spontaneous void that was gross hematuria. Based on this hospitalist we discussed going home with the family and they agreed to have him come in. Patient will be  admitted.  Vanetta Mulders, MD 04/28/15 [phone]

## 2015-04-28 NOTE — ED Notes (Signed)
Pt with bright red blood today per family.  Hx of same recently.  Family member states  Blood in urine and decrease in appetite, abd pain

## 2015-04-28 NOTE — Telephone Encounter (Signed)
I called Juan Pham and made her aware of Dr. Darrick Penna recommendations   Patient's daughter stated she will hold the aspirin and do the Anusol twice daily for 14 days and call us back with a progress report.  She would like to hold off on the tcs for right now.

## 2015-04-28 NOTE — ED Notes (Signed)
Pt was going to be discharged home per pt and family request. Micah Flesher in to initiate bolus. Assisted pt with urinal. Pt voided ~467ml of maroon colored urine, containing large amounts of blood. Dr. Rito Ehrlich aware and spoke with family once again. Dr. Deretha Emory to be made aware of change.

## 2015-04-28 NOTE — ED Notes (Signed)
Pts daughter states that pt was fine yesterday and no bleeding was noticed. States bright red blood x 1 today with BM. NAD.

## 2015-04-28 NOTE — Progress Notes (Addendum)
Asked by Dr. Deretha Emory to evaluate this patient for admission.  Juan Pham is a 82 year old Caucasian male with a past medical history of diabetes, hypertension, aortic stenosis, dementia, who is had hospitalizations and visits to the emergency department this month for rectal bleeding and UTI. He was seen 2 days ago for nausea and vomiting. He was brought in today due to blood in his commode.  The daughter is unclear as to whether this came from the urine or from his stool. Evaluation in the emergency department revealed brown stool. Urine was apparently orange colored. UA does show significant microscopic hematuria. Blood work does reveal some evidence for dehydration.  According to the patient's daughter patient did tolerate his lunch earlier today without any nausea or vomiting.  Before I entered the patient, I was informed by the ED physician that the daughter wanted to take the patient home, but wanted to talk to me first. I did offer her full evaluation and an observation stay for IV fluids and physical therapy evaluation. Apart from the mild dehydration, I didn't see anything else concerning on his workup. I did review the report of the CT of the abdomen and pelvis done 2 days ago, which also did not show any acute process.  However, the patient's daughter wanted to take him home. I did tell her that if the discoloration of urine persists after he completes treatment for his UTI, she should talk to the patient's primary care provider for referral to urology. She understands. She wants to take the patient back home. I did recommend keeping him well hydrated.  This was communicated to the ED physician.  Osvaldo Shipper 04/28/2015

## 2015-04-28 NOTE — H&P (Signed)
[facility] History and Physical  Juan Pham GQQ:[id number] DOB: 1930-05-01 DOA: 04/28/2015   PCP: Glo Herring., MD  Specialists: Recently seen by Dr. Trinda Pascal with gastroenterology  Chief Complaint: Blood in the urine  HPI: Juan Pham is a 82 y.o. male with the past medical history of dementia, hypertension, aortic stenosis, who was recently hospitalized earlier this month for rectal bleeding. The patient was seen by gastroenterology and underwent sigmoidoscopy which revealed internal hemorrhoids. Patient was subsequently discharged home. He was also found to have urinary tract infection which was treated with antibiotics. Patient was discharged home on January 16. He presented to the emergency department with complaints of nausea and vomiting on January 17. He underwent a CT scan of his abdomen, pelvis, which did not reveal any acute findings. He was discharged back home. Today, the patient's daughter saw blood in the commode. She got concerned and brought him to the hospital. Patient has dementia and is unable to provide any history. There has been no nausea, vomiting today. Overall, he has had some poor oral intake in the last few days. Originally the plan was for the patient to go back home with daughter, as the daughter did not want him in the hospital. However, subsequently he urinated in the emergency department, which was grossly bloody. This concerned the daughter. And so it is reasonable at this time for the patient to be brought into the hospital.  Home Medications: Prior to Admission medications   Medication Sig Start Date End Date Taking? Authorizing Provider  amLODipine (NORVASC) 10 MG tablet Take 10 mg by mouth daily.   Yes Historical Provider, MD  carvedilol (COREG) 12.5 MG tablet Take 0.5 tablets (6.25 mg total) by mouth 2 (two) times daily with a meal. 1/2 tablet 02/01/14  Yes Troy Sine, MD  ciprofloxacin (CIPRO) 500 MG tablet Take 1 tablet (500 mg  total) by mouth 2 (two) times daily. 04/25/15  Yes Erline Hau, MD  donepezil (ARICEPT) 5 MG tablet Take 5 mg by mouth daily. 11/23/14  Yes Historical Provider, MD  fenofibrate micronized (LOFIBRA) 134 MG capsule TAKE 1 BY MOUTH DAILY 03/22/14  Yes Troy Sine, MD  hydrochlorothiazide (MICROZIDE) 12.5 MG capsule Take 1 capsule (12.5 mg total) by mouth daily. 12/16/14  Yes Troy Sine, MD  HYDROcodone-acetaminophen (NORCO) 10-325 MG tablet Take 1 tablet by mouth every 6 (six) hours as needed for moderate pain or severe pain.  01/04/15  Yes Historical Provider, MD  hydrocortisone (ANUSOL-HC) 2.5 % rectal cream Place 1 application rectally 3 (three) times daily. 04/25/15  Yes Estela Leonie Green, MD  insulin NPH-regular Human (NOVOLIN 70/30) (70-30) 100 UNIT/ML injection Take 12 units 2 times daily. If after 3 days, his blood sugar is above 200 consistently, increase the units by 2 two times daily for 5 days; if his blood sugar is still above 200, increase the units by 2 again and continue for 5 more days. Continue this regimen until his blood sugars are ranging between 120 and 160. 02/18/15  Yes Rexene Alberts, MD  memantine (NAMENDA) 5 MG tablet Take 1 tablet by mouth daily. 01/04/15  Yes Historical Provider, MD  pantoprazole (PROTONIX) 40 MG tablet Take 1 tablet (40 mg total) by mouth 2 (two) times daily before a meal. 04/25/15  Yes Estela Leonie Green, MD  tamsulosin (FLOMAX) 0.4 MG CAPS capsule Take 1 capsule by mouth daily. 12/28/14  Yes Historical Provider, MD    Allergies: No Known  Allergies  Past Medical History: Past Medical History  Diagnosis Date  . Hypertension   . Hyperlipidemia   . Bradycardia   . Diabetes mellitus without complication ([place])     type II  . Aortic stenosis   . Diastolic dysfunction     grade 1  . Dementia   . Atrial fibrillation ([place])   . 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 Cateract    . Retna    . Cardiac catheterization N/A 12/22/2014    Procedure: Right/Left Heart Cath and Coronary Angiography;  Surgeon: Troy Sine, MD;  Location: [place] [facility];  Service: Cardiovascular;  Laterality: N/A;  . Esophagogastroduodenoscopy N/A 04/24/2015    Procedure: ESOPHAGOGASTRODUODENOSCOPY (EGD);  Surgeon: Danie Binder, MD;  [facility];  Service: Endoscopy;  Laterality: N/A;  . Esophageal dilation N/A 04/24/2015    Procedure: ESOPHAGEAL DILATION;  Surgeon: Danie Binder, MD;  [facility];  Service: Endoscopy;  Laterality: N/A;  . Flexible sigmoidoscopy N/A 04/24/2015    Procedure: FLEXIBLE SIGMOIDOSCOPY;  Surgeon: Danie Binder, MD;  [facility];  Service: Endoscopy;  Laterality: N/A;    Social History: Lives with the daughter.  Social History   Social History  . Marital Status: Widowed    Spouse Name: N/A  . Number of Children: 3  . Years of Education: HS   Occupational History  . Retired Other   Social History Main Topics  . Smoking status: Former Smoker    Types: Pipe    Quit date: 10/21/1982  . Smokeless tobacco: Never Used  . Alcohol Use: No  . Drug Use: No  . Sexual Activity: Not on file   Other Topics Concern  . Not on file   Social History Narrative   Patient lives at home alone.   Caffeine Use: 2 cups daily    Family History:  Family History  Problem Relation Age of Onset  . Heart disease Mother   . Dementia Neg Hx      Review of Systems - unable to do due to his dementia  Physical Examination  Filed Vitals:   04/28/15 1317 04/28/15 1430 04/28/15 1630 04/28/15 1738  BP: 154/45 158/45 151/57 162/60  Pulse: 58   56  Temp: 98 F (36.7 C)   97.9 F (36.6 C)  TempSrc: Temporal   Oral  Resp: 18 15 18 16   SpO2: 100%   94%    BP 162/60 mmHg  Pulse 56  Temp(Src) 97.9 F (36.6 C) (Oral)  Resp 16  SpO2 94%  General appearance: alert, cooperative, appears stated age and no  distress Head: Normocephalic, without obvious abnormality, atraumatic Eyes: conjunctivae/corneas clear. PERRL, EOM's intact. Throat: Dry mucous membranes. No oral lesions. Neck: no adenopathy, no carotid bruit, no JVD, supple, symmetrical, trachea midline and thyroid not enlarged, symmetric, no tenderness/mass/nodules Resp: Coarse breath sounds bilaterally. No wheezing, rales or rhonchi. Cardio: S1, S2 is normal. Regular. Loud systolic murmur appreciated over the precordium. No S3, S4. No pedal edema. GI: Abdomen is soft. Slightly tender over the suprapubic area without any rebound, rigidity or guarding. No masses or organomegaly. Extremities: extremities normal, atraumatic, no cyanosis or edema Pulses: 2+ and symmetric Skin: Skin color, texture, turgor normal. No rashes or lesions Lymph nodes: Cervical, supraclavicular, and axillary nodes normal. Neurologic: He is alert. Confused and disoriented, which is his baseline. No focal deficits identified.  Laboratory Data: Results  for orders placed or performed during the hospital encounter of 04/28/15 (from the past 48 hour(s))  Comprehensive metabolic panel     Status: Abnormal   Collection Time: 04/28/15  1:39 PM  Result Value Ref Range   Sodium 133 (L) 135 - 145 mmol/L   Potassium 4.0 3.5 - 5.1 mmol/L   Chloride 97 (L) 101 - 111 mmol/L   CO2 28 22 - 32 mmol/L   Glucose, Bld 281 (H) 65 - 99 mg/dL   BUN 27 (H) 6 - 20 mg/dL   Creatinine, Ser 1.30 (H) 0.61 - 1.24 mg/dL   Calcium 9.0 8.9 - 10.3 mg/dL   Total Protein 6.7 6.5 - 8.1 g/dL   Albumin 3.8 3.5 - 5.0 g/dL   AST 17 15 - 41 U/L   ALT 13 (L) 17 - 63 U/L   Alkaline Phosphatase 27 (L) 38 - 126 U/L   Total Bilirubin 0.9 0.3 - 1.2 mg/dL   GFR calc non Af Amer 49 (L) >60 mL/min   GFR calc Af Amer 57 (L) >60 mL/min    Comment: (NOTE) The eGFR has been calculated using the CKD EPI equation. This calculation has not been validated in all clinical situations. eGFR's persistently <60 mL/min  signify possible Chronic Kidney Disease.    Anion gap 8 5 - 15  Lipase, blood     Status: None   Collection Time: 04/28/15  1:39 PM  Result Value Ref Range   Lipase 14 11 - 51 U/L  Protime-INR     Status: Abnormal   Collection Time: 04/28/15  1:39 PM  Result Value Ref Range   Prothrombin Time 15.7 (H) 11.6 - 15.2 seconds   INR 1.24 0.00 - 1.49  CBC with Differential/Platelet     Status: Abnormal   Collection Time: 04/28/15  1:39 PM  Result Value Ref Range   WBC 7.9 4.0 - 10.5 K/uL   RBC 4.19 (L) 4.22 - 5.81 MIL/uL   Hemoglobin 12.7 (L) 13.0 - 17.0 g/dL   HCT 37.4 (L) 39.0 - 52.0 %   MCV 89.3 78.0 - 100.0 fL   MCH 30.3 26.0 - 34.0 pg   MCHC 34.0 30.0 - 36.0 g/dL   RDW 12.9 11.5 - 15.5 %   Platelets 266 150 - 400 K/uL   Neutrophils Relative % 76 %   Neutro Abs 6.1 1.7 - 7.7 K/uL   Lymphocytes Relative 12 %   Lymphs Abs 0.9 0.7 - 4.0 K/uL   Monocytes Relative 10 %   Monocytes Absolute 0.8 0.1 - 1.0 K/uL   Eosinophils Relative 1 %   Eosinophils Absolute 0.1 0.0 - 0.7 K/uL   Basophils Relative 1 %   Basophils Absolute 0.1 0.0 - 0.1 K/uL  Urinalysis, Routine w reflex microscopic (not at [facility])     Status: Abnormal   Collection Time: 04/28/15  3:28 PM  Result Value Ref Range   Color, Urine BROWN (A) YELLOW    Comment: BIOCHEMICALS MAY BE AFFECTED BY COLOR   APPearance HAZY (A) CLEAR   Specific Gravity, Urine >1.030 (H) 1.005 - 1.030   pH 6.5 5.0 - 8.0   Glucose, UA 500 (A) NEGATIVE mg/dL   Hgb urine dipstick LARGE (A) NEGATIVE   Bilirubin Urine SMALL (A) NEGATIVE   Ketones, ur TRACE (A) NEGATIVE mg/dL   Protein, ur 100 (A) NEGATIVE mg/dL   Nitrite NEGATIVE NEGATIVE   Leukocytes, UA NEGATIVE NEGATIVE  Urine microscopic-add on     Status: Abnormal  Collection Time: 04/28/15  3:28 PM  Result Value Ref Range   Squamous Epithelial / LPF 0-5 (A) NONE SEEN   WBC, UA 0-5 0 - 5 WBC/hpf   RBC / HPF TOO NUMEROUS TO COUNT 0 - 5 RBC/hpf   Bacteria, UA FEW (A) NONE SEEN     Radiology Reports: Dg Chest 2 View  04/28/2015  CLINICAL DATA:  Gross hematuria, generalized abdominal pain. EXAM: CHEST  2 VIEW COMPARISON:  April 12, 2015. FINDINGS: The heart size and mediastinal contours are within normal limits. No pneumothorax or pleural effusion is noted. Minimal bibasilar subsegmental atelectasis is noted secondary to hypoinflation of the lungs. The visualized skeletal structures are unremarkable. IMPRESSION: Minimal bibasilar subsegmental atelectasis secondary to hypoinflation of the lungs. Electronically Signed   By: Marijo Conception, M.D.   On: 04/28/2015 14:31    My interpretation of Electrocardiogram: sinus rhythm at 55 bpm. Normal axis. No concerning ischemic changes. Intervals are normal.  Problem List  Principal Problem:   Hematuria Active Problems:   Essential hypertension   Diabetes mellitus without complication (HCC)   Aortic stenosis, severe   Dementia   Dysphagia, idiopathic   Assessment: This is a 82 year old Caucasian male with past medical history as stated earlier, who presents with gross hematuria. Poor oral intake. His blood work suggests dehydration.  Plan: #1 gross hematuria: Notes from previous hospitalization reviewed. There is a mention of hematuria but it's unclear if it was gross or microscopic. Here in the ED tonight patient's urine does look grossly bloody. Hemoglobin is stable. CT scan done recently does not show any acute findings in the genitourinary system. At this time proceed with urology input. Urine cultures have been repeated. Continue ceftriaxone for now. He has been given a dose in the emergency department. Patient was discharged on ciprofloxacin a few days ago.  #2 mild dehydration with mild acute renal failure: We'll be given gentle IV fluids. Repeat renal function in the morning. Monitor urine output.  #3. Recent rectal bleeding with findings of internal hemorrhoids: Continue with hydrocortisone perrectally. No further  episodes of rectal bleeding has been noted. His stool was brown today, according to the ED physician.  #4 history of dementia: Continue with his home medications.  #5 Essential hypertension: Monitor blood pressures closely. Continue carvedilol. Hold amlodipine for now.  #6 history of aortic stenosis: Stable. Watch volume status closely.  #7 Insulin-dependent diabetes mellitus: CBGs will be checked. Sliding scale insulin coverage. Continue his insulin at a lower dose.  #8 anemia likely due to acute on chronic blood loss: Hemoglobin actually is stable compared to a few days ago. Continue to monitor. No need for transfusion at this time.   DVT Prophylaxis: SCDs Code Status: Discussed with the patient's daughter. He is DO NOT RESUSCITATE Family Communication: Discussed in detail with patient's daughter  Disposition Plan: Admit to MedSurg   Further management decisions will depend on results of further testing and patient's response to treatment.   [facility]  [facility] Pager [phone]  If 7PM-7AM, please contact night-coverage [email or web address] Password [facility]  04/28/2015, 6:43 PM

## 2015-04-28 NOTE — Telephone Encounter (Signed)
Patient's daughter, Elita Quick called stating her dad is having bright red blood everytime he goes to the bathroom.  This has been going on since last Friday.  He's very weak and can barely move.  Per Raynelle Fanning, the patient needs to go to the ER.  The daughter stated that the patient was just in the ER yesterday and they instructed her to call Dr. Darrick Penna.  Patient just had a Flex Sig on 04/24/15  Routing to Dr. Darrick Penna for advice.  Please call Pam [phone]

## 2015-04-29 DIAGNOSIS — F0390 Unspecified dementia without behavioral disturbance: Secondary | ICD-10-CM

## 2015-04-29 DIAGNOSIS — R319 Hematuria, unspecified: Secondary | ICD-10-CM

## 2015-04-29 DIAGNOSIS — I10 Essential (primary) hypertension: Secondary | ICD-10-CM | POA: Diagnosis not present

## 2015-04-29 DIAGNOSIS — E119 Type 2 diabetes mellitus without complications: Secondary | ICD-10-CM

## 2015-04-29 DIAGNOSIS — I350 Nonrheumatic aortic (valve) stenosis: Secondary | ICD-10-CM | POA: Diagnosis not present

## 2015-04-29 DIAGNOSIS — L8990 Pressure ulcer of unspecified site, unspecified stage: Secondary | ICD-10-CM | POA: Insufficient documentation

## 2015-04-29 LAB — COMPREHENSIVE METABOLIC PANEL
ALBUMIN: 3.4 g/dL — AB (ref 3.5–5.0)
ALT: 12 U/L — AB (ref 17–63)
AST: 14 U/L — AB (ref 15–41)
Alkaline Phosphatase: 24 U/L — ABNORMAL LOW (ref 38–126)
Anion gap: 7 (ref 5–15)
BUN: 20 mg/dL (ref 6–20)
CHLORIDE: 102 mmol/L (ref 101–111)
CO2: 27 mmol/L (ref 22–32)
CREATININE: 1.04 mg/dL (ref 0.61–1.24)
Calcium: 8.4 mg/dL — ABNORMAL LOW (ref 8.9–10.3)
GFR calc Af Amer: >60 mL/min (ref >60)
GLUCOSE: 238 mg/dL — AB (ref 65–99)
POTASSIUM: 3.9 mmol/L (ref 3.5–5.1)
SODIUM: 136 mmol/L (ref 135–145)
Total Bilirubin: 0.9 mg/dL (ref 0.3–1.2)
Total Protein: 6 g/dL — ABNORMAL LOW (ref 6.5–8.1)

## 2015-04-29 LAB — GLUCOSE, CAPILLARY
GLUCOSE-CAPILLARY: 201 mg/dL — AB (ref 65–99)
GLUCOSE-CAPILLARY: 105 mg/dL — AB (ref 65–99)
GLUCOSE-CAPILLARY: 130 mg/dL — AB (ref 65–99)
GLUCOSE-CAPILLARY: 54 mg/dL — AB (ref 65–99)
Glucose-Capillary: 192 mg/dL — ABNORMAL HIGH (ref 65–99)
Glucose-Capillary: 316 mg/dL — ABNORMAL HIGH (ref 65–99)

## 2015-04-29 LAB — CBC
HEMATOCRIT: 34.6 % — AB (ref 39.0–52.0)
Hemoglobin: 11.6 g/dL — ABNORMAL LOW (ref 13.0–17.0)
MCH: 30.3 pg (ref 26.0–34.0)
MCHC: 33.5 g/dL (ref 30.0–36.0)
MCV: 90.3 fL (ref 78.0–100.0)
PLATELETS: 239 10*3/uL (ref 150–400)
RBC: 3.83 MIL/uL — ABNORMAL LOW (ref 4.22–5.81)
RDW: 13 % (ref 11.5–15.5)
WBC: 5.9 10*3/uL (ref 4.0–10.5)

## 2015-04-29 LAB — URINE CULTURE: Culture: NO GROWTH

## 2015-04-29 NOTE — Evaluation (Signed)
Physical Therapy Evaluation Patient Details Name: Juan Pham MRN: [id number] DOB: [birth year] Today's Date: 04/29/2015   History of Present Illness  82yo M with multiple visits this month returns to [facility] after noted bloody toilet. Pt found to have gross hematuria. Pt was here 2 weeks ago for AMS, on the 1/16 for rectal bleeding, at home c N/V on 1/17, and currenly admitted on 1/19 for hematuria. DAughter is in room at eval and able to provide full social detail. PMH: DM, HTN, DM, and aortic stenosis. At baseline pt is oriented to person only.   Clinical Impression  Pt is asleep with eyes open upon arrival, daughter present to assist with history. This pt is familiar to me from previous admissions. Pt is supervision for all mobility and activity. Pt is oriented to person only, but follows commands well, especially with turns during mobility, and does not seem to demonstrate any impulsivity. Daughter is agreeable that pt is near baseline and would not benefit nor be motivated to participate with PT. Patient will have adequate social support after DC. Recommending return to home with 24/7 supervision. No additional PT services needed.   Follow Up Recommendations Home health PT;Supervision/Assistance - 24 hour;Supervision for mobility/OOB    Equipment Recommendations       Recommendations for Other Services       Precautions / Restrictions Precautions Precautions: None (no value) Restrictions Weight Bearing Restrictions: No      Mobility  Bed Mobility Overal bed mobility: Modified Independent                Transfers Overall transfer level: Needs assistance Equipment used: Rolling walker (2 wheeled) Transfers: Sit to/from Stand Sit to Stand: Min guard            Ambulation/Gait Ambulation/Gait assistance: Min guard Ambulation Distance (Feet): 200 Feet Assistive device: Rolling walker (2 wheeled)     Gait velocity interpretation: <1.8 ft/sec, indicative of risk for  recurrent falls General Gait Details: Requires assistance with RW during turns, navigating obstacles.   Stairs            Wheelchair Mobility    Modified Rankin (Stroke Patients Only)       Balance Overall balance assessment: History of Falls (daughter estimates 1 fall /month )                                           Pertinent Vitals/Pain Pain Assessment: No/denies pain    Home Living Family/patient expects to be discharged to:: Private residence Living Arrangements: Children Available Help at Discharge: Family Type of Home: House Home Access: Stairs to enter Entrance Stairs-Rails: Can reach both;Left;Right Entrance Stairs-Number of Steps: 2 Home Layout: One level Home Equipment: Environmental consultant - 2 wheels;Bedside commode      Prior Function Level of Independence: Independent with assistive device(s)         Comments: Slow, steady community distance AMB with RW and supervision. Indep in all ADL per daughtr; does not drive or prepare meals.      Hand Dominance        Extremity/Trunk Assessment   Upper Extremity Assessment: Generalized weakness;Overall [place] for tasks assessed           Lower Extremity Assessment: Generalized weakness;Overall [facility] for tasks assessed      Cervical / Trunk Assessment: Kyphotic (forward head posture)  Communication   Communication: Expressive difficulties (hypophonic. )  Cognition Arousal/Alertness: Awake/alert Behavior During Therapy: WFL for tasks assessed/performed Overall Cognitive Status:  (O to person only. ) Area of Impairment: Orientation Orientation Level: Person                  General Comments      Exercises        Assessment/Plan    PT Assessment Patent does not need any further PT services  PT Diagnosis Abnormality of gait;Generalized weakness   PT Problem List Decreased strength;Decreased knowledge of use of DME;Decreased range of motion;Decreased safety awareness;Decreased  knowledge of precautions;Decreased balance;Decreased cognition  PT Treatment Interventions     PT Goals (Current goals can be found in the Care Plan section) Acute Rehab PT Goals PT Goal Formulation: All assessment and education complete, DC therapy    Frequency     Barriers to discharge        Co-evaluation               End of Session Equipment Utilized During Treatment: Gait belt Activity Tolerance: Patient tolerated treatment well;No increased pain Patient left: in bed;with call bell/phone within reach;with bed alarm set;with family/visitor present      Functional Limitation: Changing and maintaining body position Changing and Maintaining Body Position Current Status (Z6109): At least 20 percent but less than 40 percent impaired, limited or restricted Changing and Maintaining Body Position Goal Status (U0454): At least 20 percent but less than 40 percent impaired, limited or restricted Changing and Maintaining Body Position Discharge Status [phone]): At least 20 percent but less than 40 percent impaired, limited or restricted    Time: 1112-1129 PT Time Calculation (min) (ACUTE ONLY): 17 min   Charges:   PT Evaluation $PT Eval Moderate Complexity: 1 Procedure PT Treatments $Therapeutic Activity: 8-22 mins   PT G Codes:   PT G-Codes **NOT FOR INPATIENT CLASS** Functional Limitation: Changing and maintaining body position Changing and Maintaining Body Position Current Status (B1478): At least 20 percent but less than 40 percent impaired, limited or restricted Changing and Maintaining Body Position Goal Status (G9562): At least 20 percent but less than 40 percent impaired, limited or restricted Changing and Maintaining Body Position Discharge Status [phone]): At least 20 percent but less than 40 percent impaired, limited or restricted    Buccola,Allan C 04/29/2015, 11:48 AM  11:55 AM, 04/29/2015 Rosamaria Lints, PT, DPT PRN Physical Therapist at [facility]  License # [id number] [phone] (wireless)  [phone] (mobile)

## 2015-04-29 NOTE — Care Management Note (Signed)
Case Management Note  Patient Details  Name: Juan Pham MRN: [id number] Date of Birth: [birth year]  Subjective/Objective:                  Pt is from home, lives alone and has daughter who provides 24/7 supervision. Pt is demented but mostly ind with ADL's. Pt's daughter plans for him to return home with self care. Pt's daughter prefers not to have caregivers come into home and has refused HH. Anticipate DC home over weekend.   Action/Plan: No CM needs.   Expected Discharge Date:  05/01/15               Expected Discharge Plan:  Home/Self Care  In-House Referral:  NA  Discharge planning Services  CM Consult  Post Acute Care Choice:  NA Choice offered to:  NA  DME Arranged:    DME Agency:     HH Arranged:    HH Agency:     Status of Service:  Completed, signed off  Medicare Important Message Given:    Date Medicare IM Given:    Medicare IM give by:    Date Additional Medicare IM Given:    Additional Medicare Important Message give by:     If discussed at Long Length of Stay Meetings, dates discussed:    Additional Comments:  Malcolm Metro, RN 04/29/2015, 3:23 PM

## 2015-04-29 NOTE — Progress Notes (Signed)
OT Screen Note  Patient Details Name: SAVIEN MAMULA MRN: [id number] DOB: 12-Sep-1930   Cancelled Treatment:    Reason Eval/Treat Not Completed: OT screened, no needs identified, will sign off  Limmie Patricia, OTR/L,CBIS  [phone]  04/29/2015, 11:28 AM

## 2015-04-29 NOTE — Care Management Obs Status (Signed)
MEDICARE OBSERVATION STATUS NOTIFICATION   Patient Details  Name: Juan Pham MRN: [id number] Date of Birth: 1931/01/03   Medicare Observation Status Notification Given:  Yes    Malcolm Metro, RN 04/29/2015, 3:22 PM

## 2015-04-29 NOTE — Progress Notes (Signed)
[facility] PROGRESS NOTE  Juan Pham ZOX:[id number] DOB: 1931/03/28    PCP:   Cassell Smiles., MD   HPI: Juan Pham is an 82 y.o. male with hx of dementia, HTN, HLD, DM, afib and diastolic dysFx, admitted for persistent hematuria. He has been confused, and wasn't able to give any significant history.  Abdominal Pelvic CT showed showed no acute lesions.   He was started on IV Rocephin, and his Hb stable at 11 g per dL.  Urology was consulted.   Rewiew of Systems:   Past Medical History  Diagnosis Date  . Hypertension   . Hyperlipidemia   . Bradycardia   . Diabetes mellitus without complication (HCC)     type II  . Aortic stenosis   . Diastolic dysfunction     grade 1  . Dementia   . Atrial fibrillation (HCC)   . 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 Cateract    . Retna    . Cardiac catheterization N/A 12/22/2014    Procedure: Right/Left Heart Cath and Coronary Angiography;  Surgeon: Lennette Bihari, MD;  Location: [facility] INVASIVE CV LAB;  Service: Cardiovascular;  Laterality: N/A;  . Esophagogastroduodenoscopy N/A 04/24/2015    Procedure: ESOPHAGOGASTRODUODENOSCOPY (EGD);  Surgeon: West Bali, MD;  Location: [facility] ENDO SUITE;  Service: Endoscopy;  Laterality: N/A;  . Esophageal dilation N/A 04/24/2015    Procedure: ESOPHAGEAL DILATION;  Surgeon: West Bali, MD;  Location: [facility] END[facility] SUITE;  Service: Endoscopy;  Laterality: N/A;  . Flexible sigmoidoscopy N/A 04/24/2015    Procedure: FLEXIBLE SIGMOIDOSCOPY;  Surgeon: West Bali, MD;  Location: [facility] ENDO SUITE;  Service: Endoscopy;  Laterality: N/A;    Medications:  HOME MEDS: Prior to Admission medications   Medication Sig Start Date End Date Taking? Authorizing Provider  amLODipine (NORVASC) 10 MG tablet Take 10 mg by mouth daily.   Yes Historical Provider, MD  carvedilol (COREG) 12.5 MG tablet Take 0.5 tablets (6.25 mg total) by mouth 2 (two)  times daily with a meal. 1/2 tablet 02/01/14  Yes Lennette Bihari, MD  ciprofloxacin (CIPRO) 500 MG tablet Take 1 tablet (500 mg total) by mouth 2 (two) times daily. 04/25/15  Yes Henderson Cloud, MD  donepezil (ARICEPT) 5 MG tablet Take 5 mg by mouth daily. 11/23/14  Yes Historical Provider, MD  fenofibrate micronized (LOFIBRA) 134 MG capsule TAKE 1 BY MOUTH DAILY 03/22/14  Yes Lennette Bihari, MD  hydrochlorothiazide (MICROZIDE) 12.5 MG capsule Take 1 capsule (12.5 mg total) by mouth daily. 12/16/14  Yes Lennette Bihari, MD  HYDROcodone-acetaminophen (NORCO) 10-325 MG tablet Take 1 tablet by mouth every 6 (six) hours as needed for moderate pain or severe pain.  01/04/15  Yes Historical Provider, MD  hydrocortisone (ANUSOL-HC) 2.5 % rectal cream Place 1 application rectally 3 (three) times daily. 04/25/15  Yes Estela Isaiah Blakes, MD  insulin NPH-regular Human (NOVOLIN 70/30) (70-30) 100 UNIT/ML injection Take 12 units 2 times daily. If after 3 days, his blood sugar is above 200 consistently, increase the units by 2 two times daily for 5 days; if his blood sugar is still above 200, increase the units by 2 again and continue for 5 more days. Continue this regimen until his blood sugars are ranging between 120 and 160. 02/18/15  Yes Elliot Cousin, MD  memantine (NAMENDA) 5 MG tablet Take 1 tablet by mouth daily. 01/04/15  Yes Historical  Provider, MD  pantoprazole (PROTONIX) 40 MG tablet Take 1 tablet (40 mg total) by mouth 2 (two) times daily before a meal. 04/25/15  Yes Estela Isaiah Blakes, MD  tamsulosin (FLOMAX) 0.4 MG CAPS capsule Take 1 capsule by mouth daily. 12/28/14  Yes Historical Provider, MD     Allergies:  No Known Allergies  Social History:   reports that he quit smoking about 32 years ago. His smoking use included Pipe. He has never used smokeless tobacco. He reports that he does not drink alcohol or use illicit drugs.  Family History: Family History  Problem Relation Age  of Onset  . Heart disease Mother   . Dementia Neg Hx      Physical Exam: Filed Vitals:   04/28/15 1847 04/28/15 1900 04/28/15 2019 04/29/15 0546  BP: 171/49 163/55 164/10 169/31  Pulse: 60 57 58 45  Temp:   99.1 F (37.3 C) 98.4 F (36.9 C)  TempSrc:   Oral Oral  Resp: Height:   (1.727 m)    Weight:  62 kg (136 lb 11 oz)    SpO2: 94% 95% 96% 96%   Blood pressure 169/31, pulse 45, temperature 98.4 F (36.9 C), temperature source Oral, resp. rate 16, height  (1.727 m), weight 62 kg (136 lb 11 oz), SpO2 96 %.  GEN:  Pleasant  patient lying in the stretcher in no acute distress; cooperative with exam. PSYCH:  alert and confused.; does not appear anxious or depressed; affect is appropriate. HEENT: Mucous membranes pink and anicteric; PERRLA; EOM intact; no cervical lymphadenopathy nor thyromegaly or carotid bruit; no JVD; There were no stridor. Neck is very supple. Breasts:: Not examined CHEST WALL: No tenderness CHEST: Normal respiration, clear to auscultation bilaterally.  HEART: Regular rate and rhythm.  There are no murmur, rub, or gallops.   BACK: No kyphosis or scoliosis; no CVA tenderness ABDOMEN: soft and non-tender; no masses, no organomegaly, normal abdominal bowel sounds; no pannus; no intertriginous candida. There is no rebound and no distention. Rectal Exam: Not done EXTREMITIES: No bone or joint deformity; age-appropriate arthropathy of the hands and knees; no edema; no ulcerations.  There is no calf tenderness. Genitalia: not examined PULSES: 2+ and symmetric SKIN: Normal hydration no rash or ulceration CNS: Cranial nerves 2-12 grossly intact no focal lateralizing neurologic deficit.  Speech is fluent; uvula elevated with phonation, facial symmetry and tongue midline. DTR are normal bilaterally, cerebella exam is intact, barbinski is negative and strengths are equaled bilaterally.  No sensory loss.   Labs on Admission:  Basic Metabolic  Panel:  Recent Labs Lab 04/22/15 1700 04/23/15 0618 04/26/15 0959 04/28/15 1339 04/29/15 0520  NA 138 138 135 133* 136  K 4.2 3.7 4.0 4.0 3.9  CL 101 103 100* 97* 102  CO2 GLUCOSE 305* 218* 320* 281* 238*  BUN 35* 30* 21* 27* 20  CREATININE 1.24 1.02 1.11 1.30* 1.04  CALCIUM 9.5 8.8* 9.3 9.0 8.4*   Liver Function Tests:  Recent Labs Lab 04/22/15 1700 04/23/15 0618 04/26/15 0959 04/28/15 1339 04/29/15 0520  AST 15 14* 16 17 14*  ALT 15* 13* 14* 13* 12*  ALKPHOS 26* 23* 28* 27* 24*  BILITOT 0.8 1.1 0.9 0.9 0.9  PROT 7.0 6.6 7.1 6.7 6.0*  ALBUMIN 4.1 3.7 4.0 3.8 3.4*    Recent Labs Lab 04/26/15 0959 04/28/15 1339  LIPASE 16 14   CBC:  Recent Labs Lab  04/22/15 1700  04/24/15 0619 04/25/15 0857 04/26/15 0959 04/28/15 1339 04/29/15 0520  WBC 8.3  --  8.5 6.8 8.2 7.9 5.9  NEUTROABS 5.3  --  5.3  --   --  6.1  --   HGB 13.0  < > 12.5* 12.0* 13.1 12.7* 11.6*  HCT 38.4*  < > 36.7* 34.7* 38.9* 37.4* 34.6*  MCV 90.6  --  90.0 88.5 89.6 89.3 90.3  PLT 325  --  296 276 289 266 239  < > = values in this interval not displayed. Cardiac Enzymes: CBG:  Recent Labs Lab 04/25/15 0743 04/25/15 1126 04/28/15 2033 04/29/15 0804 04/29/15 1136  GLUCAP 201* 192* 204* 105* 316*     Radiological Exams on Admission:  Dg Chest 2 View  04/28/2015  CLINICAL DATA:  Gross hematuria, generalized abdominal pain. EXAM: CHEST  2 VIEW COMPARISON:  April 12, 2015. FINDINGS: The heart size and mediastinal contours are within normal limits. No pneumothorax or pleural effusion is noted. Minimal bibasilar subsegmental atelectasis is noted secondary to hypoinflation of the lungs. The visualized skeletal structures are unremarkable. IMPRESSION: Minimal bibasilar subsegmental atelectasis secondary to hypoinflation of the lungs. Electronically Signed   By: Lupita Raider, M.D.   On: 04/28/2015 14:31   Assessment/Plan Present on Admission:  . Hematuria . Dementia .  Essential hypertension  PLAN:  Hematuria:  I spoke with his daughters, and essentially, they do NOT want him to undergo any invasive procedure, including cystoscopy.  I explained that pathology can be missed, including bladder cancer.  They understood and still decline to have him undergo cystoscopy.  Plan will be to continue with IV antibiotic in case it is a hemmorhagic cystitis.  Family also understand that if he continue to bleed, he won't be able to continue with transfusion indefinitely if it is too frequent.  He is a DNR.  Thank you.  Other plans as per orders. Code Status: DNR.    Houston Siren, MD.  FACP [facility] Pager [phone] 7pm to 7am.  04/29/2015, 2:43 PM

## 2015-04-30 DIAGNOSIS — I350 Nonrheumatic aortic (valve) stenosis: Secondary | ICD-10-CM

## 2015-04-30 DIAGNOSIS — R1310 Dysphagia, unspecified: Secondary | ICD-10-CM

## 2015-04-30 DIAGNOSIS — E119 Type 2 diabetes mellitus without complications: Secondary | ICD-10-CM | POA: Diagnosis not present

## 2015-04-30 DIAGNOSIS — R319 Hematuria, unspecified: Secondary | ICD-10-CM | POA: Diagnosis not present

## 2015-04-30 DIAGNOSIS — F0390 Unspecified dementia without behavioral disturbance: Secondary | ICD-10-CM | POA: Diagnosis not present

## 2015-04-30 LAB — HEMOGLOBIN AND HEMATOCRIT, BLOOD
HEMATOCRIT: 36.4 % — AB (ref 39.0–52.0)
Hemoglobin: 12.4 g/dL — ABNORMAL LOW (ref 13.0–17.0)

## 2015-04-30 LAB — GLUCOSE, CAPILLARY
GLUCOSE-CAPILLARY: 117 mg/dL — AB (ref 65–99)
Glucose-Capillary: 103 mg/dL — ABNORMAL HIGH (ref 65–99)
Glucose-Capillary: 224 mg/dL — ABNORMAL HIGH (ref 65–99)

## 2015-04-30 MED ORDER — IPRATROPIUM BROMIDE 0.02 % IN SOLN
RESPIRATORY_TRACT | Status: AC
  Filled 2015-04-30: qty 2.5

## 2015-04-30 NOTE — Discharge Summary (Signed)
Physician Discharge Summary  Juan Pham:[id number] DOB: 02/03/1931 DOA: 04/28/2015  PCP: Cassell Smiles., MD  Admit date: 04/28/2015 Discharge date: 04/30/2015  Time spent: 35 minutes  Recommendations for Outpatient Follow-up:    Follow up with PCP next week.    Discharge Diagnoses:  Principal Problem:   Hematuria Active Problems:   Essential hypertension   Diabetes mellitus without complication (HCC)   Aortic stenosis, severe   Dementia   Dysphagia, idiopathic   Pressure ulcer   Discharge Condition: slight improvement with hematuria.   Diet recommendation: As tolerated.   Filed Weights   04/28/15 1900  Weight: 62 kg (136 lb 11 oz)    History of present illness:  Juan Pham is a 82 y.o. male with the past medical history of dementia, hypertension, aortic stenosis, who was recently hospitalized earlier this month for rectal bleeding. The patient was seen by gastroenterology and underwent sigmoidoscopy which revealed internal hemorrhoids. Patient was subsequently discharged home. He was also found to have urinary tract infection which was treated with antibiotics. Patient was discharged home on January 16. He presented to the emergency department with complaints of nausea and vomiting on January 17. He underwent a CT scan of his abdomen, pelvis, which did not reveal any acute findings. He was discharged back home. Today, the patient's daughter saw blood in the commode. She got concerned and brought him to the hospital. Patient has dementia and is unable to provide any history. There has been no nausea, vomiting today. Overall, he has had some poor oral intake in the last few days. Originally the plan was for the patient to go back home with daughter, as the daughter did not want him in the hospital. However, subsequently he urinated in the emergency department, which was grossly bloody. This concerned the daughter. And so it is reasonable at this time for the patient to be  brought into the hospital.   Hospital Course: Patient was admitted into the hospital, and he was started on IV Rocephin for possible hemorrhagic cystitis.  This was questionable, as his UA did not show evidence of infection.  His UA one week prior did show evidence of infection, however.  He and his three daughters were told that it could be other causes of hematuria, such as bladder cancer, and that cystoscopy is indicated.  However, due to his overall debilitating condition, with his other medical problems, including aortic stenosis, they do not want him to have any cystoscopy nor any invasive procedure.  With that, his urology consultation was cancelled.  His urine did not grow anything, and after the cipro and IV rocephin in the hospital, he will not be discharged on any further antibiotics.  He did not required any transfusion, as his Hb remained stable.  His family in making decision for him, has considered palliative care for him, and I told them that at anytime they are ready, we will be happy to provide it to him.  His daughters has always wanted to take him home, and he is stable for discharge.     Consultations:  None.   Discharge Exam: Filed Vitals:   04/29/15 2021 04/30/15 0601  BP: 134/40 167/40  Pulse: 49 53  Temp: 98.1 F (36.7 C) 97.6 F (36.4 C)  Resp: 15 15    Discharge Instructions   Discharge Instructions    Diet - low sodium heart healthy    Complete by:  As directed      Increase activity slowly  Complete by:  As directed           Current Discharge Medication List    CONTINUE these medications which have NOT CHANGED   Details  amLODipine (NORVASC) 10 MG tablet Take 10 mg by mouth daily.    carvedilol (COREG) 12.5 MG tablet Take 0.5 tablets (6.25 mg total) by mouth 2 (two) times daily with a meal. 1/2 tablet Qty: 90 tablet, Refills: 3    donepezil (ARICEPT) 5 MG tablet Take 5 mg by mouth daily.    fenofibrate micronized (LOFIBRA) 134 MG capsule  TAKE 1 BY MOUTH DAILY Qty: 90 capsule, Refills: 2    hydrochlorothiazide (MICROZIDE) 12.5 MG capsule Take 1 capsule (12.5 mg total) by mouth daily. Qty: 90 capsule, Refills: 3    HYDROcodone-acetaminophen (NORCO) 10-325 MG tablet Take 1 tablet by mouth every 6 (six) hours as needed for moderate pain or severe pain.     hydrocortisone (ANUSOL-HC) 2.5 % rectal cream Place 1 application rectally 3 (three) times daily. Qty: 30 g, Refills: 0    insulin NPH-regular Human (NOVOLIN 70/30) (70-30) 100 UNIT/ML injection Take 12 units 2 times daily. If after 3 days, his blood sugar is above 200 consistently, increase the units by 2 two times daily for 5 days; if his blood sugar is still above 200, increase the units by 2 again and continue for 5 more days. Continue this regimen until his blood sugars are ranging between 120 and 160. Qty: 10 mL, Refills: 11    memantine (NAMENDA) 5 MG tablet Take 1 tablet by mouth daily.    pantoprazole (PROTONIX) 40 MG tablet Take 1 tablet (40 mg total) by mouth 2 (two) times daily before a meal. Qty: 60 tablet, Refills: 2    tamsulosin (FLOMAX) 0.4 MG CAPS capsule Take 1 capsule by mouth daily.      STOP taking these medications     ciprofloxacin (CIPRO) 500 MG tablet        No Known Allergies    The results of significant diagnostics from this hospitalization (including imaging, microbiology, ancillary and laboratory) are listed below for reference.    Significant Diagnostic Studies: Dg Chest 2 View  04/28/2015  CLINICAL DATA:  Gross hematuria, generalized abdominal pain. EXAM: CHEST  2 VIEW COMPARISON:  April 12, 2015. FINDINGS: The heart size and mediastinal contours are within normal limits. No pneumothorax or pleural effusion is noted. Minimal bibasilar subsegmental atelectasis is noted secondary to hypoinflation of the lungs. The visualized skeletal structures are unremarkable. IMPRESSION: Minimal bibasilar subsegmental atelectasis secondary to  hypoinflation of the lungs. Electronically Signed   By: Lupita Raider, M.D.   On: 04/28/2015 14:31   Dg Chest 2 View  04/12/2015  CLINICAL DATA:  Chest pain, sore throat EXAM: CHEST  2 VIEW COMPARISON:  02/17/2015 FINDINGS: The heart size and mediastinal contours are within normal limits. Both lungs are clear. The visualized skeletal structures are unremarkable. IMPRESSION: No active cardiopulmonary disease. Electronically Signed   By: Charlett Nose M.D.   On: 04/12/2015 13:08   Ct Head Wo Contrast  04/26/2015  CLINICAL DATA:  Vomiting for 3 days with severe headaches after vomiting. Recent esophageal dilatation and hemorrhoid resection yesterday. EXAM: CT HEAD WITHOUT CONTRAST TECHNIQUE: Contiguous axial images were obtained from the base of the skull through the vertex without intravenous contrast. COMPARISON:  April 12, 2015 FINDINGS: Paranasal sinuses, mastoid air cells, and bones are within normal limits. The extracranial soft tissues are unremarkable. No subdural, epidural, or subarachnoid  hemorrhage. No mass, mass effect, or midline shift. Ventricles and sulci are prominent but stable. Cerebellum, brainstem, and basal cisterns are unchanged as well. No acute cortical ischemia or infarct. IMPRESSION: No acute intracranial process identified to explain the patient's headaches. Electronically Signed   By: Gerome Sam III M.D   On: 04/26/2015 11:49   Ct Head Wo Contrast  04/12/2015  CLINICAL DATA:  Altered mental status. EXAM: CT HEAD WITHOUT CONTRAST TECHNIQUE: Contiguous axial images were obtained from the base of the skull through the vertex without intravenous contrast. COMPARISON:  01/01/2015 MR brain.  09/17/2014 CT head. FINDINGS: No evidence for acute infarction, hemorrhage, mass lesion, or extra-axial fluid. Generalized atrophy. Chronic microvascular ischemic change of a mild to moderate nature. Vascular calcification. Calvarium intact. No sinus or mastoid disease. IMPRESSION: Atrophy and  small vessel disease. No acute intracranial findings. Similar appearance to priors. Electronically Signed   By: Elsie Stain M.D.   On: 04/12/2015 13:28   Ct Abdomen Pelvis W Contrast  04/26/2015  CLINICAL DATA:  Right-sided abdominal pain and vomiting EXAM: CT ABDOMEN AND PELVIS WITH CONTRAST TECHNIQUE: Multidetector CT imaging of the abdomen and pelvis was performed using the standard protocol following bolus administration of intravenous contrast. CONTRAST:  OMNIPAQUE IOHEXOL 300 MG/ML SOLN, 25mL OMNIPAQUE IOHEXOL 300 MG/ML SOLN COMPARISON:  07/18/2009 FINDINGS: Lower chest and abdominal wall: Bulky aortic valve calcification with thickened appearance of left ventricular walls. Left inguinal hernia which chronically contains nonobstructed or inflamed sigmoid colon. Hepatobiliary: No focal liver abnormality.No evidence of biliary obstruction or stone. Pancreas: Atrophy without inflammation or duct enlargement. Spleen: Unremarkable. Adrenals/Urinary Tract: Mildly lobulated adrenal glands without measurable nodule. Punctate stone in the lower pole left kidney. No hydronephrosis or ureteral calculus. Bladder is moderately full with chronic wall thickening. Reproductive:Chronic marked enlargement of the prostate gland deforming the bladder base. Stomach/Bowel:  No obstruction. No appendicitis. Vascular/Lymphatic: Extensive atheromatous wall thickening and calcification of the aorta and branch vessels with celiac and SMA origin stenosis. There is also prominent plaque at the celiac bifurcation. No mass or adenopathy. Peritoneal: No ascites or pneumoperitoneum. Musculoskeletal: No acute abnormalities. IMPRESSION: 1. No acute finding. 2. Chronic left inguinal hernia containing nonobstructed sigmoid colon. 3. Bulky aortic valve calcification. If no previous echocardiography suggest outpatient follow-up to evaluate for stenosis. 4. Marked prostatomegaly with chronic outlet obstruction. The bladder is moderately  distended currently. Electronically Signed   By: Marnee Spring M.D.   On: 04/26/2015 11:47    Microbiology: Recent Results (from the past 240 hour(s))  Culture, Urine     Status: None   Collection Time: 04/23/15  7:09 PM  Result Value Ref Range Status   Specimen Description URINE, CLEAN CATCH  Final   Special Requests NONE  Final   Culture   Final    MULTIPLE SPECIES PRESENT, SUGGEST RECOLLECTION Performed at [facility]    Report Status 04/26/2015 FINAL  Final  Urine culture     Status: None   Collection Time: 04/28/15  3:28 PM  Result Value Ref Range Status   Specimen Description URINE, CATHETERIZED  Final   Special Requests NONE  Final   Culture   Final    NO GROWTH 1 DAY Performed at [facility]    Report Status 04/29/2015 FINAL  Final     Labs: Basic Metabolic Panel:  Recent Labs Lab 04/26/15 0959 04/28/15 1339 04/29/15 0520  NA 135 133* 136  K 4.0 4.0 3.9  CL 100* 97* 102  CO2  25 28 27   GLUCOSE 320* 281* 238*  BUN 21* 27* 20  CREATININE 1.11 1.30* 1.04  CALCIUM 9.3 9.0 8.4*   Liver Function Tests:  Recent Labs Lab 04/26/15 0959 04/28/15 1339 04/29/15 0520  AST 16 17 14*  ALT 14* 13* 12*  ALKPHOS 28* 27* 24*  BILITOT 0.9 0.9 0.9  PROT 7.1 6.7 6.0*  ALBUMIN 4.0 3.8 3.4*    Recent Labs Lab 04/26/15 0959 04/28/15 1339  LIPASE 16 14   No results for input(s): AMMONIA in the last 168 hours. CBC:  Recent Labs Lab 04/24/15 0619 04/25/15 0857 04/26/15 0959 04/28/15 1339 04/29/15 0520 04/30/15 0818  WBC 8.5 6.8 8.2 7.9 5.9  --   NEUTROABS 5.3  --   --  6.1  --   --   HGB 12.5* 12.0* 13.1 12.7* 11.6* 12.4*  HCT 36.7* 34.7* 38.9* 37.4* 34.6* 36.4*  MCV 90.0 88.5 89.6 89.3 90.3  --   PLT 296 276 289 266 239  --     Recent Labs  04/22/15 1700  BNP 79.0   CBG:  Recent Labs Lab 04/29/15 1136 04/29/15 1606 04/29/15 2023 04/29/15 2113 04/30/15 0718  GLUCAP 316* 130* 54* 103* 224*   Signed:  Saloni Lablanc MD.   [facility] 04/30/2015, 11:56 AM

## 2015-04-30 NOTE — Progress Notes (Signed)
Patient alert and oriented, independent, VSS, pt. Tolerating diet well. No complaints of pain or nausea. Pt. Had IV removed tip intact. Pt. Had prescriptions given. Pt. Voiced understanding of discharge instructions with no further questions. Pt. Discharged via wheelchair with nurse tech.  

## 2015-05-11 ENCOUNTER — Telehealth: Payer: Self-pay | Admitting: Gastroenterology

## 2015-05-11 NOTE — Telephone Encounter (Signed)
PLEASE CALL PT'S DAUGHTER. His stomach Bx shows gastritis.   CONTINUE PROTONIX. TAKE TWICE DAILY UNTIL Jul 23 2015 THEN ONCE DAILY. WE WILL NOT SCHEDULE A FOLLOW UP APPT UNLESS THEY FEEL HE NEEDS TO BE SEEN. PLEASE CALL WITH QUESTIONS OR CONCERNS.

## 2015-05-12 ENCOUNTER — Emergency Department (HOSPITAL_COMMUNITY): Payer: PPO

## 2015-05-12 ENCOUNTER — Inpatient Hospital Stay (HOSPITAL_COMMUNITY)
Admission: EM | Admit: 2015-05-12 | Discharge: 2015-05-16 | DRG: 682 | Disposition: A | Discharge status: Hospice | Payer: PPO | Attending: Internal Medicine | Admitting: Internal Medicine

## 2015-05-12 ENCOUNTER — Encounter (HOSPITAL_COMMUNITY): Payer: Self-pay | Admitting: Emergency Medicine

## 2015-05-12 DIAGNOSIS — I4891 Unspecified atrial fibrillation: Secondary | ICD-10-CM | POA: Diagnosis not present

## 2015-05-12 DIAGNOSIS — Z515 Encounter for palliative care: Secondary | ICD-10-CM | POA: Diagnosis present

## 2015-05-12 DIAGNOSIS — R627 Adult failure to thrive: Secondary | ICD-10-CM | POA: Diagnosis present

## 2015-05-12 DIAGNOSIS — G9340 Encephalopathy, unspecified: Secondary | ICD-10-CM | POA: Diagnosis present

## 2015-05-12 DIAGNOSIS — R400 Somnolence: Secondary | ICD-10-CM

## 2015-05-12 DIAGNOSIS — R4182 Altered mental status, unspecified: Secondary | ICD-10-CM

## 2015-05-12 DIAGNOSIS — E119 Type 2 diabetes mellitus without complications: Secondary | ICD-10-CM | POA: Diagnosis present

## 2015-05-12 DIAGNOSIS — Z23 Encounter for immunization: Secondary | ICD-10-CM

## 2015-05-12 DIAGNOSIS — N390 Urinary tract infection, site not specified: Secondary | ICD-10-CM | POA: Diagnosis not present

## 2015-05-12 DIAGNOSIS — G309 Alzheimer's disease, unspecified: Secondary | ICD-10-CM | POA: Diagnosis present

## 2015-05-12 DIAGNOSIS — N179 Acute kidney failure, unspecified: Secondary | ICD-10-CM | POA: Diagnosis present

## 2015-05-12 DIAGNOSIS — Z87891 Personal history of nicotine dependence: Secondary | ICD-10-CM | POA: Diagnosis not present

## 2015-05-12 DIAGNOSIS — F03918 Unspecified dementia, unspecified severity, with other behavioral disturbance: Secondary | ICD-10-CM | POA: Insufficient documentation

## 2015-05-12 DIAGNOSIS — F0281 Dementia in other diseases classified elsewhere with behavioral disturbance: Secondary | ICD-10-CM | POA: Diagnosis present

## 2015-05-12 DIAGNOSIS — E785 Hyperlipidemia, unspecified: Secondary | ICD-10-CM | POA: Diagnosis present

## 2015-05-12 DIAGNOSIS — F0391 Unspecified dementia with behavioral disturbance: Secondary | ICD-10-CM | POA: Insufficient documentation

## 2015-05-12 DIAGNOSIS — E860 Dehydration: Secondary | ICD-10-CM | POA: Diagnosis present

## 2015-05-12 DIAGNOSIS — Z66 Do not resuscitate: Secondary | ICD-10-CM | POA: Diagnosis present

## 2015-05-12 DIAGNOSIS — Z794 Long term (current) use of insulin: Secondary | ICD-10-CM | POA: Diagnosis not present

## 2015-05-12 DIAGNOSIS — Z8249 Family history of ischemic heart disease and other diseases of the circulatory system: Secondary | ICD-10-CM

## 2015-05-12 DIAGNOSIS — I10 Essential (primary) hypertension: Secondary | ICD-10-CM | POA: Diagnosis present

## 2015-05-12 DIAGNOSIS — N3000 Acute cystitis without hematuria: Secondary | ICD-10-CM | POA: Diagnosis not present

## 2015-05-12 LAB — COMPREHENSIVE METABOLIC PANEL
ALBUMIN: 4 g/dL (ref 3.5–5.0)
ALT: 16 U/L — ABNORMAL LOW (ref 17–63)
ANION GAP: 11 (ref 5–15)
AST: 22 U/L (ref 15–41)
Alkaline Phosphatase: 34 U/L — ABNORMAL LOW (ref 38–126)
BUN: 41 mg/dL — AB (ref 6–20)
CHLORIDE: 98 mmol/L — AB (ref 101–111)
CO2: 30 mmol/L (ref 22–32)
Calcium: 10 mg/dL (ref 8.9–10.3)
Creatinine, Ser: 2.19 mg/dL — ABNORMAL HIGH (ref 0.61–1.24)
GFR calc Af Amer: 31 mL/min — ABNORMAL LOW (ref >60)
GFR calc non Af Amer: 26 mL/min — ABNORMAL LOW (ref >60)
GLUCOSE: 136 mg/dL — AB (ref 65–99)
POTASSIUM: 4.1 mmol/L (ref 3.5–5.1)
SODIUM: 139 mmol/L (ref 135–145)
Total Bilirubin: 1.1 mg/dL (ref 0.3–1.2)
Total Protein: 7.4 g/dL (ref 6.5–8.1)

## 2015-05-12 LAB — URINALYSIS, ROUTINE W REFLEX MICROSCOPIC
GLUCOSE, UA: NEGATIVE mg/dL
Leukocytes, UA: NEGATIVE
Nitrite: POSITIVE — AB
PROTEIN: 100 mg/dL — AB
Specific Gravity, Urine: >1.03 — ABNORMAL HIGH (ref 1.005–1.030)
pH: 6.5 (ref 5.0–8.0)

## 2015-05-12 LAB — CK: Total CK: 114 U/L (ref 49–397)

## 2015-05-12 LAB — CBC WITH DIFFERENTIAL/PLATELET
BASOS ABS: 0.1 10*3/uL (ref 0.0–0.1)
Basophils Relative: 1 %
EOS PCT: 1 %
Eosinophils Absolute: 0.1 10*3/uL (ref 0.0–0.7)
HEMATOCRIT: 38.1 % — AB (ref 39.0–52.0)
HEMOGLOBIN: 12.7 g/dL — AB (ref 13.0–17.0)
LYMPHS ABS: 2.1 10*3/uL (ref 0.7–4.0)
LYMPHS PCT: 17 %
MCH: 30.3 pg (ref 26.0–34.0)
MCHC: 33.3 g/dL (ref 30.0–36.0)
MCV: 90.9 fL (ref 78.0–100.0)
MONO ABS: 1.4 10*3/uL — AB (ref 0.1–1.0)
Monocytes Relative: 11 %
NEUTROS ABS: 8.8 10*3/uL — AB (ref 1.7–7.7)
Neutrophils Relative %: 70 %
Platelets: 348 10*3/uL (ref 150–400)
RBC: 4.19 MIL/uL — AB (ref 4.22–5.81)
RDW: 13.5 % (ref 11.5–15.5)
WBC: 12.6 10*3/uL — AB (ref 4.0–10.5)

## 2015-05-12 LAB — I-STAT CG4 LACTIC ACID, ED: LACTIC ACID, VENOUS: 1.98 mmol/L (ref 0.5–2.0)

## 2015-05-12 LAB — URINE MICROSCOPIC-ADD ON

## 2015-05-12 LAB — CBG MONITORING, ED: Glucose-Capillary: 118 mg/dL — ABNORMAL HIGH (ref 65–99)

## 2015-05-12 LAB — AMMONIA: AMMONIA: 19 umol/L (ref 9–35)

## 2015-05-12 MED ORDER — DONEPEZIL HCL 5 MG PO TABS
5.0000 mg | ORAL_TABLET | Freq: Every day | ORAL | Status: DC
  Administered 2015-05-13 – 2015-05-15 (×3): 5 mg via ORAL
  Filled 2015-05-12 (×4): qty 1

## 2015-05-12 MED ORDER — SODIUM CHLORIDE 0.9 % IV BOLUS (SEPSIS)
1000.0000 mL | Freq: Once | INTRAVENOUS | Status: AC
  Administered 2015-05-12: 1000 mL via INTRAVENOUS

## 2015-05-12 MED ORDER — NALOXONE HCL 0.4 MG/ML IJ SOLN
1.0000 mg | Freq: Once | INTRAMUSCULAR | Status: AC
  Administered 2015-05-12: 1 mg via INTRAVENOUS
  Filled 2015-05-12: qty 3

## 2015-05-12 MED ORDER — DEXTROSE-NACL 5-0.9 % IV SOLN
INTRAVENOUS | Status: DC
  Administered 2015-05-12 – 2015-05-14 (×3): via INTRAVENOUS

## 2015-05-12 MED ORDER — INFLUENZA VAC SPLIT QUAD 0.5 ML IM SUSY: 0.5000 mL | PREFILLED_SYRINGE | INTRAMUSCULAR | Status: DC

## 2015-05-12 MED ORDER — DEXTROSE 5 % IV SOLN
1.0000 g | Freq: Once | INTRAVENOUS | Status: AC
  Administered 2015-05-12: 1 g via INTRAVENOUS
  Filled 2015-05-12: qty 10

## 2015-05-12 MED ORDER — MEMANTINE HCL 5 MG PO TABS
5.0000 mg | ORAL_TABLET | Freq: Every day | ORAL | Status: DC
  Administered 2015-05-13 – 2015-05-15 (×2): 5 mg via ORAL
  Filled 2015-05-12 (×4): qty 1

## 2015-05-12 MED ORDER — TAMSULOSIN HCL 0.4 MG PO CAPS
0.4000 mg | ORAL_CAPSULE | Freq: Every day | ORAL | Status: DC
  Administered 2015-05-13 – 2015-05-15 (×3): 0.4 mg via ORAL
  Filled 2015-05-12 (×4): qty 1

## 2015-05-12 MED ORDER — INFLUENZA VAC SPLIT QUAD 0.5 ML IM SUSY
0.5000 mL | PREFILLED_SYRINGE | INTRAMUSCULAR | Status: AC
  Administered 2015-05-13: 0.5 mL via INTRAMUSCULAR
  Filled 2015-05-12: qty 0.5

## 2015-05-12 MED ORDER — HEPARIN SODIUM (PORCINE) 5000 UNIT/ML IJ SOLN
5000.0000 [IU] | Freq: Three times a day (TID) | INTRAMUSCULAR | Status: DC
  Administered 2015-05-12 – 2015-05-16 (×9): 5000 [IU] via SUBCUTANEOUS
  Filled 2015-05-12 (×8): qty 1

## 2015-05-12 MED ORDER — DEXTROSE 5 % IV SOLN
1.0000 g | INTRAVENOUS | Status: DC
  Administered 2015-05-12 – 2015-05-14 (×3): 1 g via INTRAVENOUS
  Filled 2015-05-12 (×3): qty 10

## 2015-05-12 MED ORDER — AMLODIPINE BESYLATE 5 MG PO TABS
10.0000 mg | ORAL_TABLET | Freq: Every day | ORAL | Status: DC
Start: 2015-05-13 — End: 2015-05-16
  Administered 2015-05-13 – 2015-05-16 (×4): 10 mg via ORAL
  Filled 2015-05-12 (×5): qty 2

## 2015-05-12 MED ORDER — CARVEDILOL 3.125 MG PO TABS
6.2500 mg | ORAL_TABLET | Freq: Two times a day (BID) | ORAL | Status: DC
  Administered 2015-05-13 – 2015-05-16 (×5): 6.25 mg via ORAL
  Filled 2015-05-12 (×6): qty 2

## 2015-05-12 MED ORDER — PANTOPRAZOLE SODIUM 40 MG PO TBEC
40.0000 mg | DELAYED_RELEASE_TABLET | Freq: Two times a day (BID) | ORAL | Status: DC
  Administered 2015-05-13 – 2015-05-15 (×4): 40 mg via ORAL
  Filled 2015-05-12 (×5): qty 1

## 2015-05-12 NOTE — H&P (Signed)
[facility] History and Physical  Juan Pham MVH:[id number] DOB: Jun 21, 1930    PCP:   Cassell Smiles., MD   Chief Complaint: Altered mental status  HPI: Juan Pham is an 82 y.o. male  With a hx of HTN, HLD, DM type 2, dementia, presents with complaints of AMS. I had the opportunity to care for him on previous admission for hematuria. During that time, his family did not want any aggressive measures done upon him, and consideration for hospice or palliative care was strongly considered.  Today, workup showed WBC 12.6 and elevated creatinine 2.19. UA was positive for a UTI.  Patient was lethargic and was not able to converse at all today.  Hospitalist was asked to admit him for UTI, AMS, and AKI with Cr of 2.19.  While in ED, patient had reported an increase in abnormal and frequent falling but denied hitting his head. He was able to speak earlier, but has since become less responsive and was having abnormal shaking. Patient has had no hx of Sz in the past.   Rewiew of Systems: Unable.   Past Medical History  Diagnosis Date  . Hypertension   . Hyperlipidemia   . Bradycardia   . Diabetes mellitus without complication (HCC)     type II  . Aortic stenosis   . Diastolic dysfunction     grade 1  . Dementia   . Atrial fibrillation (HCC)   . 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 Cateract    . Retna    . Cardiac catheterization N/A 12/22/2014    Procedure: Right/Left Heart Cath and Coronary Angiography;  Surgeon: Lennette Bihari, MD;  Location: [facility] INVASIVE CV LAB;  Service: Cardiovascular;  Laterality: N/A;  . Esophagogastroduodenoscopy N/A 04/24/2015    Procedure: ESOPHAGOGASTRODUODENOSCOPY (EGD);  Surgeon: West Bali, MD;  Location: [facility];  Service: Endoscopy;  Laterality: N/A;  . Esophageal dilation N/A 04/24/2015    Procedure: ESOPHAGEAL DILATION;  Surgeon: West Bali, MD;  Location: [facility]  SU[facility];  Service: Endoscopy;  Laterality: N/A;  . Flexible sigmoidoscopy N/A 04/24/2015    Procedure: FLEXIBLE SIGMOIDOSCOPY;  Surgeon: West Bali, MD;  Location: [facility];  Service: Endoscopy;  Laterality: N/A;    Medications:  HOME MEDS: Prior to Admission medications   Medication Sig Start Date End Date Taking? Authorizing Provider  amLODipine (NORVASC) 10 MG tablet Take 10 mg by mouth daily.   Yes Historical Provider, MD  carvedilol (COREG) 12.5 MG tablet Take 0.5 tablets (6.25 mg total) by mouth 2 (two) times daily with a meal. 1/2 tablet 02/01/14  Yes Lennette Bihari, MD  donepezil (ARICEPT) 5 MG tablet Take 5 mg by mouth daily. 11/23/14  Yes Historical Provider, MD  fenofibrate micronized (LOFIBRA) 134 MG capsule TAKE 1 BY MOUTH DAILY 03/22/14  Yes Lennette Bihari, MD  hydrochlorothiazide (MICROZIDE) 12.5 MG capsule Take 1 capsule (12.5 mg total) by mouth daily. 12/16/14  Yes Lennette Bihari, MD  hydrocortisone (ANUSOL-HC) 2.5 % rectal cream Place 1 application rectally 3 (three) times daily. 04/25/15  Yes Estela Isaiah Blakes, MD  insulin NPH-regular Human (NOVOLIN 70/30) (70-30) 100 UNIT/ML injection Take 12 units 2 times daily. If after 3 days, his blood sugar is above 200 consistently, increase the units by 2 two times daily for 5 days; if his blood sugar is still above 200, increase the units by 2 again and continue  for 5 more days. Continue this regimen until his blood sugars are ranging between 120 and 160. 02/18/15  Yes Elliot Cousin, MD  memantine (NAMENDA) 5 MG tablet Take 1 tablet by mouth daily. 01/04/15  Yes Historical Provider, MD  pantoprazole (PROTONIX) 40 MG tablet Take 1 tablet (40 mg total) by mouth 2 (two) times daily before a meal. 04/25/15  Yes Estela Isaiah Blakes, MD  tamsulosin (FLOMAX) 0.4 MG CAPS capsule Take 1 capsule by mouth daily. 12/28/14  Yes Historical Provider, MD  HYDROcodone-acetaminophen (NORCO) 10-325 MG tablet Take 1 tablet by mouth every 6  (six) hours as needed for moderate pain or severe pain.  01/04/15   Historical Provider, MD    Social History:   reports that he quit smoking about 32 years ago. His smoking use included Pipe. He has never used smokeless tobacco. He reports that he does not drink alcohol or use illicit drugs.  Family History: Family History  Problem Relation Age of Onset  . Heart disease Mother   . Dementia Neg Hx      Physical Exam: Filed Vitals:   05/12/15 2000 05/12/15 2030 05/12/15 2045 05/12/15 2100  BP: 115/40 106/34 112/36 109/37  Pulse: 59 58 58 58  Temp:      TempSrc:      Resp: Weight:      SpO2: 94% 95% 94% 96%   Blood pressure 109/37, pulse 58, temperature 99.4 F (37.4 C), temperature source Rectal, resp. rate 20, weight 61.236 kg (135 lb), SpO2 96 %.  GEN:  Lethargic and non-responsicve PSYCH: Does not appear anxious or depressed; affect is appropriate. Non-verbal, not responding to environment HEENT: Mucous membranes pink and anicteric; PERRLA; EOM intact; no cervical lymphadenopathy nor thyromegaly or carotid bruit; no JVD; There were no stridor. Neck is very supple. Breasts:: Not examined CHEST WALL: No tenderness CHEST: Normal respiration, clear to auscultation bilaterally.  HEART: Regular rate and rhythm.  There are no murmur, rub, or gallops.   BACK: No kyphosis or scoliosis; no CVA tenderness ABDOMEN: soft and non-tender; no masses, no organomegaly, normal abdominal bowel sounds; no pannus; no intertriginous candida. There is no rebound and no distention. Rectal Exam: Not done EXTREMITIES: No bone or joint deformity; age-appropriate arthropathy of the hands and knees; no edema; no ulcerations.  There is no calf tenderness. Genitalia: not examined PULSES: 2+ and symmetric SKIN: Normal hydration no rash or ulceration CNS: Cranial nerves 2-12 grossly intact no focal lateralizing neurologic deficit; uvula elevated with phonation, facial symmetry and tongue  midline. DTR are normal bilaterally, cerebella exam is intact, barbinski is negative and strengths are equaled bilaterally.  No sensory loss.    Labs on Admission:  Basic Metabolic Panel:  Recent Labs Lab 05/12/15 1900  NA 139  K 4.1  CL 98*  CO2 30  GLUCOSE 136*  BUN 41*  CREATININE 2.19*  CALCIUM 10.0   Liver Function Tests:  Recent Labs Lab 05/12/15 1900  AST 22  ALT 16*  ALKPHOS 34*  BILITOT 1.1  PROT 7.4  ALBUMIN 4.0    Recent Labs Lab 05/12/15 1900  AMMONIA 19   CBC:  Recent Labs Lab 05/12/15 1900  WBC 12.6*  NEUTROABS 8.8*  HGB 12.7*  HCT 38.1*  MCV 90.9  PLT 348   Cardiac Enzymes:  Recent Labs Lab 05/12/15 1900  CKTOTAL 114    CBG:  Recent Labs Lab 05/12/15 1858  GLUCAP 118*     Radiological Exams on  Admission: Dg Chest 1 View  05/12/2015  CLINICAL DATA:  82 year old with Alzheimer's dementia found unresponsive by his family earlier today. EMS found patient with pinpoint pupils and respirations at 6 per minute. Patient responded to an intravenous dose of Narcan. EXAM: CHEST 1 VIEW COMPARISON:  04/28/2015 and earlier. FINDINGS: AP erect image was obtained. Suboptimal inspiration accounts for crowded bronchovascular markings, especially in the bases, and accentuates the cardiac silhouette. Taking this into account, cardiac silhouette upper normal in size. Lungs clear. Bronchovascular markings normal. Pulmonary vascularity normal. No visible pleural effusions. No pneumothorax. IMPRESSION: Suboptimal inspiration.  No acute cardiopulmonary disease. Electronically Signed   By: Hulan Saas M.D.   On: 05/12/2015 19:41   Ct Head Wo Contrast  05/12/2015  CLINICAL DATA:  Patient found unresponsive tonight. Initial encounter. EXAM: CT HEAD WITHOUT CONTRAST TECHNIQUE: Contiguous axial images were obtained from the base of the skull through the vertex without intravenous contrast. COMPARISON:  Head CT scan 04/26/2015. FINDINGS: There is atrophy and  chronic microvascular ischemic change. No evidence of acute intracranial abnormality including hemorrhage, infarct, mass lesion, mass effect, midline shift or abnormal extra-axial fluid collection is seen. No hydrocephalus or pneumocephalus. The calvarium is intact. Imaged paranasal sinuses and mastoid air cells are clear. IMPRESSION: No acute abnormality. Atrophy and chronic microvascular ischemic change. Electronically Signed   By: Drusilla Kanner M.D.   On: 05/12/2015 19:39   Assessment/Plan 1. AMS, with increasing abnormal amount of fallings. Patient did not report any head injury but has become less responsive throughout day and now. His head CT did not show any acute process.  Patient began to shake and has not had any previous history of seizures.  It did not look like a seizure activity.  2. AKI, hold nephrotoxic drugs.  Will give IVF and follow Cr carefully.   3. UTI:  WIll continue Tx with IV Rocephin.   PLAN:  Continue DNR code status, Family did not want aggressive measures taken  IVF for AKI  Treat with Rocephin for UTI  Will hold nephrotoxic drugs  Other plans as per orders.  Code Status: DNR DVT Prophylaxis: SCD's Family Communication: No family at bedside Disposition Plan: Admit to hospital bed    Houston Siren, MD. FACP [facility] Pager [phone] 7pm to 7am.  05/12/2015, 9:52 PM   By signing my name below, I, Adron Bene, attest that this documentation has been prepared under the direction and in the presence of Houston Siren, MD. Electronically Signed: Adron Bene, Scribe 05/12/2015 9:52pm

## 2015-05-12 NOTE — ED Notes (Signed)
Unable to obtain accurate bp, md notified, MD at bedside.

## 2015-05-12 NOTE — Telephone Encounter (Signed)
Pt's daughter, Elita Quick, is aware.

## 2015-05-12 NOTE — ED Notes (Addendum)
Family found patient unresponsive, EMS called. Upon arrival of EMS, pt. Was found to have pinpoint pupil and respirations were 6/min. 2 mg total of Narcan given. Pt began to come around to his baseline per family. Patient has alzheimers. Pt is a DNR per family but did not have paperwork, states we have it on file here. CBG 152 en route.

## 2015-05-12 NOTE — ED Notes (Signed)
Spoke with Dr. Hyacinth Meeker about antibiotics and canceling code sepsis.

## 2015-05-12 NOTE — ED Provider Notes (Signed)
CSN: [id number]     Arrival date & time 05/12/15  1809 History   First MD Initiated Contact with Patient 05/12/15 1832     Chief Complaint  Patient presents with  . Altered Mental Status     (Consider location/radiation/quality/duration/timing/severity/associated sxs/prior Treatment) HPI Comments:  The patient is a elderly male, he currently lives by himself,  His daughter lives beside him, she states that today he has not been quite himself, infectious states that over the last couple of days things of an abnormal including frequent falls, he fell 3 times in the last 12 hours. There was no known head injury but he did fall onto his back and then again fell out of bed last night. He was able to speak earlier in the day today however as the day went on the patient has become less responsive and is now starting to have some abnormal shaking movements which she has never had before. There is no history of seizures though there is a history of hypertension. The patient is unable to give any history, he is nonverbal, he is also DO NOT RESUSCITATE according to the daughter who is the healthcare power of attorney.  Patient is a 82 y.o. male presenting with altered mental status. The history is provided by the patient.  Altered Mental Status   Past Medical History  Diagnosis Date  . Hypertension   . Hyperlipidemia   . Bradycardia   . Diabetes mellitus without complication (HCC)     type II  . Aortic stenosis   . Diastolic dysfunction     grade 1  . Dementia   . Atrial fibrillation (HCC)   . 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 Cateract    . Retna    . Cardiac catheterization N/A 12/22/2014    Procedure: Right/Left Heart Cath and Coronary Angiography;  Surgeon: Lennette Bihari, MD;  Location: [facility] INVASIVE CV LAB;  Service: Cardiovascular;  Laterality: N/A;  . Esophagogastroduodenoscopy N/A 04/24/2015    Procedure:  ESOPHAGOGASTRODUODENOSCOPY (EGD);  Surgeon: West Bali, MD;  Location: [facility];  Service: Endoscopy;  Laterality: N/A;  . Esophageal dilation N/A 04/24/2015    Procedure: ESOPHAGEAL DILATION;  Surgeon: West Bali, MD;  Location: [facility];  Service: Endoscopy;  Laterality: N/A;  . Flexible sigmoidoscopy N/A 04/24/2015    Procedure: FLEXIBLE SIGMOIDOSCOPY;  Surgeon: West Bali, MD;  Location: [facility];  Service: Endoscopy;  Laterality: N/A;   Family History  Problem Relation Age of Onset  . Heart disease Mother   . Dementia Neg Hx    Social History  Substance Use Topics  . Smoking status: Former Smoker    Types: Pipe    Quit date: 10/21/1982  . Smokeless tobacco: Never Used  . Alcohol Use: No    Review of Systems  Unable to perform ROS: Mental status change      Allergies  Review of patient's allergies indicates no known allergies.  Home Medications   Prior to Admission medications   Medication Sig Start Date End Date Taking? Authorizing Provider  amLODipine (NORVASC) 10 MG tablet Take 10 mg by mouth daily.   Yes Historical Provider, MD  carvedilol (COREG) 12.5 MG tablet Take 0.5 tablets (6.25 mg total) by mouth 2 (two) times daily with a meal. 1/2 tablet 02/01/14  Yes Lennette Bihari, MD  donepezil (ARICEPT) 5 MG tablet Take 5 mg by mouth daily. 11/23/14  Yes Historical Provider, MD  fenofibrate micronized (LOFIBRA) 134 MG capsule TAKE 1 BY MOUTH DAILY 03/22/14  Yes Lennette Bihari, MD  hydrochlorothiazide (MICROZIDE) 12.5 MG capsule Take 1 capsule (12.5 mg total) by mouth daily. 12/16/14  Yes Lennette Bihari, MD  hydrocortisone (ANUSOL-HC) 2.5 % rectal cream Place 1 application rectally 3 (three) times daily. 04/25/15  Yes Estela Isaiah Blakes, MD  insulin NPH-regular Human (NOVOLIN 70/30) (70-30) 100 UNIT/ML injection Take 12 units 2 times daily. If after 3 days, his blood sugar is above 200 consistently, increase the units by 2 two times daily for 5  days; if his blood sugar is still above 200, increase the units by 2 again and continue for 5 more days. Continue this regimen until his blood sugars are ranging between 120 and 160. 02/18/15  Yes Elliot Cousin, MD  memantine (NAMENDA) 5 MG tablet Take 1 tablet by mouth daily. 01/04/15  Yes Historical Provider, MD  pantoprazole (PROTONIX) 40 MG tablet Take 1 tablet (40 mg total) by mouth 2 (two) times daily before a meal. 04/25/15  Yes Estela Isaiah Blakes, MD  tamsulosin (FLOMAX) 0.4 MG CAPS capsule Take 1 capsule by mouth daily. 12/28/14  Yes Historical Provider, MD  HYDROcodone-acetaminophen (NORCO) 10-325 MG tablet Take 1 tablet by mouth every 6 (six) hours as needed for moderate pain or severe pain.  01/04/15   Historical Provider, MD   BP 142/48 mmHg  Pulse 62  Temp(Src) 98.1 F (36.7 C) (Axillary)  Resp 16  Wt 106 lb 14.8 oz (48.5 kg)  SpO2 97% Physical Exam  Constitutional: He appears distressed.  HENT:  Head: Normocephalic and atraumatic.  Mouth/Throat: No oropharyngeal exudate.   Dry mucous membranes  Eyes: Conjunctivae and EOM are normal. Right eye exhibits no discharge. Left eye exhibits no discharge. No scleral icterus.   Pupils are pinpoint  Neck: Normal range of motion. Neck supple. No JVD present. No thyromegaly present.  Cardiovascular: Normal rate, regular rhythm and intact distal pulses.  Exam reveals no gallop and no friction rub.   Murmur (  systolic murmur) heard. Pulmonary/Chest: Effort normal and breath sounds normal. No respiratory distress. He has no wheezes. He has no rales.  Abdominal: Soft. Bowel sounds are normal. He exhibits no distension and no mass. There is no tenderness.  Musculoskeletal: Normal range of motion. He exhibits no edema or tenderness.  Lymphadenopathy:    He has no cervical adenopathy.  Neurological:   Obtunded, arouses only to painful stimuli, no speech , eyes are open , intermittent myoclonic jerking infrequent episodes  Skin: Skin is  warm and dry. No rash noted. No erythema.  Psychiatric: He has a normal mood and affect. His behavior is normal.  Nursing note and vitals reviewed.   ED Course  Procedures (including critical care time) Labs Review Labs Reviewed  COMPREHENSIVE METABOLIC PANEL - Abnormal; Notable for the following:    Chloride 98 (*)    Glucose, Bld 136 (*)    BUN 41 (*)    Creatinine, Ser 2.19 (*)    ALT 16 (*)    Alkaline Phosphatase 34 (*)    GFR calc non Af Amer 26 (*)    GFR calc Af Amer 31 (*)    All other components within normal limits  CBC WITH DIFFERENTIAL/PLATELET - Abnormal; Notable for the following:    WBC 12.6 (*)    RBC 4.19 (*)    Hemoglobin 12.7 (*)    HCT 38.1 (*)  Neutro Abs 8.8 (*)    Monocytes Absolute 1.4 (*)    All other components within normal limits  URINALYSIS, ROUTINE W REFLEX MICROSCOPIC (NOT AT [facility]) - Abnormal; Notable for the following:    Color, Urine BROWN (*)    APPearance CLOUDY (*)    Specific Gravity, Urine >1.030 (*)    Hgb urine dipstick LARGE (*)    Bilirubin Urine MODERATE (*)    Ketones, ur TRACE (*)    Protein, ur 100 (*)    Nitrite POSITIVE (*)    All other components within normal limits  URINE MICROSCOPIC-ADD ON - Abnormal; Notable for the following:    Squamous Epithelial / LPF 0-5 (*)    Bacteria, UA MANY (*)    All other components within normal limits  CBG MONITORING, ED - Abnormal; Notable for the following:    Glucose-Capillary 118 (*)    All other components within normal limits  CULTURE, BLOOD (ROUTINE X 2)  CULTURE, BLOOD (ROUTINE X 2)  URINE CULTURE  AMMONIA  CK  TSH  COMPREHENSIVE METABOLIC PANEL  CBC  I-STAT CG4 LACTIC ACID, ED    Imaging Review Dg Chest 1 View  05/12/2015  CLINICAL DATA:  82 year old with Alzheimer's dementia found unresponsive by his family earlier today. EMS found patient with pinpoint pupils and respirations at 6 per minute. Patient responded to an intravenous dose of Narcan. EXAM: CHEST 1 VIEW  COMPARISON:  04/28/2015 and earlier. FINDINGS: AP erect image was obtained. Suboptimal inspiration accounts for crowded bronchovascular markings, especially in the bases, and accentuates the cardiac silhouette. Taking this into account, cardiac silhouette upper normal in size. Lungs clear. Bronchovascular markings normal. Pulmonary vascularity normal. No visible pleural effusions. No pneumothorax. IMPRESSION: Suboptimal inspiration.  No acute cardiopulmonary disease. Electronically Signed   By: Hulan Saas M.D.   On: 05/12/2015 19:41   Ct Head Wo Contrast  05/12/2015  CLINICAL DATA:  Patient found unresponsive tonight. Initial encounter. EXAM: CT HEAD WITHOUT CONTRAST TECHNIQUE: Contiguous axial images were obtained from the base of the skull through the vertex without intravenous contrast. COMPARISON:  Head CT scan 04/26/2015. FINDINGS: There is atrophy and chronic microvascular ischemic change. No evidence of acute intracranial abnormality including hemorrhage, infarct, mass lesion, mass effect, midline shift or abnormal extra-axial fluid collection is seen. No hydrocephalus or pneumocephalus. The calvarium is intact. Imaged paranasal sinuses and mastoid air cells are clear. IMPRESSION: No acute abnormality. Atrophy and chronic microvascular ischemic change. Electronically Signed   By: Drusilla Kanner M.D.   On: 05/12/2015 19:39   I have personally reviewed and evaluated these images and lab results as part of my medical decision-making.    MDM   Final diagnoses:  UTI (lower urinary tract infection)  Acute renal failure, unspecified acute renal failure type (HCC)     vital signs are rather unremarkable except for his blood pressure which is high though I suspect is related to the myoclonic jerking. He has an abnormal exam, this could be consistent with opiate overdose, this could also be consistent with stroke or multiple other metabolic abnormalities. We'll check blood sugar, CT scan of the  brain, EKG, he will likely need to be admitted to the hospital. Will give Narcan immediately  Pt has UTI Acute Renal Failure No improvement with Narcan Other labs / imaging without acute findings.  Eber Hong, MD 05/12/15 [phone]

## 2015-05-13 DIAGNOSIS — F0391 Unspecified dementia with behavioral disturbance: Secondary | ICD-10-CM

## 2015-05-13 DIAGNOSIS — R627 Adult failure to thrive: Secondary | ICD-10-CM | POA: Diagnosis present

## 2015-05-13 DIAGNOSIS — N179 Acute kidney failure, unspecified: Secondary | ICD-10-CM | POA: Diagnosis not present

## 2015-05-13 DIAGNOSIS — G9340 Encephalopathy, unspecified: Secondary | ICD-10-CM

## 2015-05-13 DIAGNOSIS — F03918 Unspecified dementia, unspecified severity, with other behavioral disturbance: Secondary | ICD-10-CM | POA: Insufficient documentation

## 2015-05-13 DIAGNOSIS — E860 Dehydration: Secondary | ICD-10-CM | POA: Diagnosis present

## 2015-05-13 DIAGNOSIS — N390 Urinary tract infection, site not specified: Secondary | ICD-10-CM | POA: Insufficient documentation

## 2015-05-13 DIAGNOSIS — N3000 Acute cystitis without hematuria: Secondary | ICD-10-CM

## 2015-05-13 LAB — CBC
HCT: 33.3 % — ABNORMAL LOW (ref 39.0–52.0)
HEMOGLOBIN: 11 g/dL — AB (ref 13.0–17.0)
MCH: 30.3 pg (ref 26.0–34.0)
MCHC: 33 g/dL (ref 30.0–36.0)
MCV: 91.7 fL (ref 78.0–100.0)
PLATELETS: 292 10*3/uL (ref 150–400)
RBC: 3.63 MIL/uL — AB (ref 4.22–5.81)
RDW: 13.6 % (ref 11.5–15.5)
WBC: 9.7 10*3/uL (ref 4.0–10.5)

## 2015-05-13 LAB — COMPREHENSIVE METABOLIC PANEL
ALK PHOS: 28 U/L — AB (ref 38–126)
ALT: 14 U/L — ABNORMAL LOW (ref 17–63)
ANION GAP: 9 (ref 5–15)
AST: 21 U/L (ref 15–41)
Albumin: 3.2 g/dL — ABNORMAL LOW (ref 3.5–5.0)
BILIRUBIN TOTAL: 0.7 mg/dL (ref 0.3–1.2)
BUN: 31 mg/dL — ABNORMAL HIGH (ref 6–20)
CALCIUM: 8.7 mg/dL — AB (ref 8.9–10.3)
CO2: 26 mmol/L (ref 22–32)
CREATININE: 1.3 mg/dL — AB (ref 0.61–1.24)
Chloride: 105 mmol/L (ref 101–111)
GFR, EST AFRICAN AMERICAN: 57 mL/min — AB (ref >60)
GFR, EST NON AFRICAN AMERICAN: 49 mL/min — AB (ref >60)
Glucose, Bld: 194 mg/dL — ABNORMAL HIGH (ref 65–99)
Potassium: 4.2 mmol/L (ref 3.5–5.1)
Sodium: 140 mmol/L (ref 135–145)
TOTAL PROTEIN: 6.2 g/dL — AB (ref 6.5–8.1)

## 2015-05-13 LAB — TSH: TSH: 0.879 u[IU]/mL (ref 0.350–4.500)

## 2015-05-13 MED ORDER — ACETAMINOPHEN 325 MG PO TABS
650.0000 mg | ORAL_TABLET | Freq: Four times a day (QID) | ORAL | Status: DC | PRN
  Administered 2015-05-13 – 2015-05-14 (×2): 650 mg via ORAL
  Filled 2015-05-13 (×2): qty 2

## 2015-05-13 MED ORDER — OXYCODONE HCL 5 MG PO TABS
5.0000 mg | ORAL_TABLET | ORAL | Status: DC | PRN
  Administered 2015-05-13: 5 mg via ORAL
  Filled 2015-05-13: qty 1

## 2015-05-13 MED ORDER — ENSURE ENLIVE PO LIQD
237.0000 mL | Freq: Two times a day (BID) | ORAL | Status: DC
  Administered 2015-05-13 – 2015-05-16 (×5): 237 mL via ORAL

## 2015-05-13 NOTE — Care Management Important Message (Signed)
Important Message  Patient Details  Name: Juan Pham MRN: [id number] Date of Birth: September 28, 1930   Medicare Important Message Given:  Yes    Adonis Huguenin, RN 05/13/2015, 8:29 AM

## 2015-05-13 NOTE — Care Management Note (Signed)
Case Management Note  Patient Details  Name: Juan Pham MRN: [id number] Date of Birth: [birth year]  Subjective/Objective:      Discussed case with Juan Carmel NP palliative care.  Family would like patient to be discharge with Home Hospice.     I then spoke with the daughter Juan Pham who chose [facility].  Referral faxed to [facility] and  Liaison Juan Pham notified.    Action/Plan:   Home with hospice care.    Expected Discharge Date:                  Expected Discharge Plan:  Home w Hospice Care  In-House Referral:     Discharge planning Services  CM Consult  Post Acute Care Choice:  Hospice Choice offered to:  Adult Children  DME Arranged:    DME Agency:     HH Arranged:    HH Agency:   ([facility])  Status of Service:  In process, will continue to follow  Medicare Important Message Given:  Yes Date Medicare IM Given:    Medicare IM give by:    Date Additional Medicare IM Given:    Additional Medicare Important Message give by:     If discussed at Long Length of Stay Meetings, dates discussed:    Additional Comments:  Juan Huguenin, RN 05/13/2015, 1:11 PM

## 2015-05-13 NOTE — Progress Notes (Signed)
[facility] PROGRESS NOTE  Juan Pham QMV:[id number] DOB: 1930-12-10 DOA: 05/12/2015 PCP: Cassell Smiles., MD  Assessment/Plan: 1. Acute encephalopathy -Juan Pham is a pleasant 82 year old gentleman with a history of advanced dementia having repeated hospitalizations, admitted overnight for mental status changes. Symptoms likely secondary to urinary tract infection and dehydration from minimal by mouth intake. -Labs revealing acute kidney injury -Since be back at his baseline this morning as creatinine has trended down from 2.1 1.3 with IV fluid resuscitation. He was also started on IV antibiotic therapy for urinary tract infection.  2.  Acute kidney injury -Likely secondary to prerenal azotemia resulting from minimal by mouth intake -Responded to IV fluids as creatinine trended down from 2.19 on presentation to 1.3.  3.  Urinary tract infection. -Urinalysis showed the presence of many bacteria with nitrates. -Patient was started on empiric IV antibiotic therapy with ceftriaxone 1 g IV every 24 hours  4.  Goals of care -I spoke with his daughter at bedside regarding Juan Pham's diagnosis and re-hospitalizations. I explained advanced dementia and its consequences. She reported being his sole care giver and expressed concerns for her father's quality of life. Looking at his current situation she states, " this is not quality of life." I explained to her that hospitalization can be quite burdensome and interventions performed in the hospital were not benign and could lead to significant discomfort without changing endpoints. I explained the philosophy of palliative care as she is interested in holding a family meeting with palliative care to discuss his medical goals of care and comfort/quality of life. Palliative care consultation placed.  Code Status: DNR Family Communication: I spoke with his daughter at bedside Disposition Plan: Palliative care consult  placed   Consultants:  Palliative Care   Antibiotics:  Rocephin  HPI/Subjective: Juan Pham is an 82 year old gentleman with a past medical history of advanced dementia, diabetes mellitus, hypertension, having multiple previous hospitalizations for mental status changes who currently resides at home with his daughter. Admitted to medicine service on 05/12/2015 when he presented with acute encephalopathy/lethargy. Laboratory data revealed acute kidney injury with creatinine of 2.19. Urinalysis showed urinary tract infection. He was started on gentle IV fluid resuscitation along with empiric IV antibiotic therapy. Spoke with his daughter at bedside who is his primary caregiver. He voiced concerns for repeated hospitalizations and expressed interest and focusing his care on comfort. Palliative care was consulted.  Objective: Filed Vitals:   05/12/15 2324 05/13/15 0646  BP: 142/48 159/37  Pulse: 62 52  Temp: 98.1 F (36.7 C) 98.2 F (36.8 C)  Resp: 16 14   No intake or output data in the 24 hours ending 05/13/15 0936 Filed Weights   05/12/15 1825 05/12/15 2324  Weight: 61.236 kg (135 lb) 48.5 kg (106 lb 14.8 oz)    Exam:   General:  He is awake and alert however confused, cannot answer questions appropriately, fidgeting with his sheet  Cardiovascular: 3-6 systolic ejection murmur  Respiratory: Normal respiratory effort lungs are clear  Abdomen: Soft nontender nondistended  Musculoskeletal: Sarcopenia  Data Reviewed: Basic Metabolic Panel:  Recent Labs Lab 05/12/15 1900 05/13/15 0625  NA 139 140  K 4.1 4.2  CL 98* 105  CO2 30 26  GLUCOSE 136* 194*  BUN 41* 31*  CREATININE 2.19* 1.30*  CALCIUM 10.0 8.7*   Liver Function Tests:  Recent Labs Lab 05/12/15 1900 05/13/15 0625  AST 22 21  ALT 16* 14*  ALKPHOS 34* 28*  BILITOT 1.1 0.7  PROT  7.4 6.2*  ALBUMIN 4.0 3.2*   No results for input(s): LIPASE, AMYLASE in the last 168 hours.  Recent Labs Lab  05/12/15 1900  AMMONIA 19   CBC:  Recent Labs Lab 05/12/15 1900 05/13/15 0625  WBC 12.6* 9.7  NEUTROABS 8.8*  --   HGB 12.7* 11.0*  HCT 38.1* 33.3*  MCV 90.9 91.7  PLT 348 292   Cardiac Enzymes:  Recent Labs Lab 05/12/15 1900  CKTOTAL 114   BNP (last 3 results)  Recent Labs  04/22/15 1700  BNP 79.0    ProBNP (last 3 results) No results for input(s): PROBNP in the last 8760 hours.  CBG:  Recent Labs Lab 05/12/15 1858  GLUCAP 118*    Recent Results (from the past 240 hour(s))  Blood Culture (routine x 2)     Status: None (Preliminary result)   Collection Time: 05/12/15  8:02 PM  Result Value Ref Range Status   Specimen Description BLOOD LEFT FOREARM  Final   Special Requests BOTTLES DRAWN AEROBIC ONLY 6CC  Final   Culture NO GROWTH < 12 HOURS  Final   Report Status PENDING  Incomplete  Blood Culture (routine x 2)     Status: None (Preliminary result)   Collection Time: 05/12/15  8:12 PM  Result Value Ref Range Status   Specimen Description BLOOD LEFT HAND  Final   Special Requests BOTTLES DRAWN AEROBIC ONLY 6CC  Final   Culture NO GROWTH < 12 HOURS  Final   Report Status PENDING  Incomplete     Studies: Dg Chest 1 View  05/12/2015  CLINICAL DATA:  81 year old with Alzheimer's dementia found unresponsive by his family earlier today. EMS found patient with pinpoint pupils and respirations at 6 per minute. Patient responded to an intravenous dose of Narcan. EXAM: CHEST 1 VIEW COMPARISON:  04/28/2015 and earlier. FINDINGS: AP erect image was obtained. Suboptimal inspiration accounts for crowded bronchovascular markings, especially in the bases, and accentuates the cardiac silhouette. Taking this into account, cardiac silhouette upper normal in size. Lungs clear. Bronchovascular markings normal. Pulmonary vascularity normal. No visible pleural effusions. No pneumothorax. IMPRESSION: Suboptimal inspiration.  No acute cardiopulmonary disease. Electronically Signed    By: Hulan Saas M.D.   On: 05/12/2015 19:41   Ct Head Wo Contrast  05/12/2015  CLINICAL DATA:  Patient found unresponsive tonight. Initial encounter. EXAM: CT HEAD WITHOUT CONTRAST TECHNIQUE: Contiguous axial images were obtained from the base of the skull through the vertex without intravenous contrast. COMPARISON:  Head CT scan 04/26/2015. FINDINGS: There is atrophy and chronic microvascular ischemic change. No evidence of acute intracranial abnormality including hemorrhage, infarct, mass lesion, mass effect, midline shift or abnormal extra-axial fluid collection is seen. No hydrocephalus or pneumocephalus. The calvarium is intact. Imaged paranasal sinuses and mastoid air cells are clear. IMPRESSION: No acute abnormality. Atrophy and chronic microvascular ischemic change. Electronically Signed   By: Drusilla Kanner M.D.   On: 05/12/2015 19:39    Scheduled Meds: . amLODipine  10 mg Oral Daily  . carvedilol  6.25 mg Oral BID WC  . cefTRIAXone (ROCEPHIN)  IV  1 g Intravenous Q24H  . donepezil  5 mg Oral Daily  . heparin  5,000 Units Subcutaneous 3 times per day  . Influenza vac split quadrivalent PF  0.5 mL Intramuscular Tomorrow-1000  . memantine  5 mg Oral Daily  . pantoprazole  40 mg Oral BID AC  . tamsulosin  0.4 mg Oral Daily   Continuous Infusions: . dextrose 5 %  and 0.9% NaCl 75 mL/hr at 05/12/15 2300    Active Problems:   Altered mental status    Time spent: 35 min    Jeralyn Bennett  [facility] Pager [phone]. If 7PM-7AM, please contact night-coverage at [email or web address], password [facility] 05/13/2015, 9:36 AM  LOS: 1 day

## 2015-05-13 NOTE — Consult Note (Signed)
Consultation Note Date: 05/13/2015   Patient Name: Juan Pham  DOB: 1930-04-17  MRN: [id number]  Age / Sex: 82 y.o., male  PCP: Elfredia Nevins, MD Referring Physician: Jeralyn Bennett, MD  Reason for Consultation: Disposition, Establishing goals of care, Hospice Evaluation and Psychosocial/spiritual support    Clinical Assessment/Narrative: Mr. Olivares is resting in bed with his daughters at his bedside.  We talk about his current health concerns.  [facility]) shares that she had promised her father that he would never have to go to a SNF.  She talks about the family losses over the last few years.  Lost mother about 3 years ago, and Pam lost her son about 2 years ago, (with hospice) and she shares that she still attends meetings.   Sisters encourage Pam that placement in SNF is needed to preserve her health.  I share the chronic illness trajectory and also the difference between home health and Hospice. Pam tells me that they were dropped by cardio d/t declining surgery, and she continues to decline any invasive treatments for her father.  We talk about the concept of do not re hospitalize, and do not treat the next infection.  We talk about letting nature take it's course.    Contacts/Participants in Discussion:  Daughters Bonnell Public, Annett Gula, and [place].  Primary Decision Maker: Daughter Bonnell Public Relationship to Patient Daughters  HCPOA: yes  Daughter Zalyn Amend Code Status/Advance Care Planning: DNR  We discuss the concepts of do no rehospitalize, do not treat the next infection.     Code Status Orders        Start     Ordered   05/12/15 2256  Do not attempt resuscitation (DNR)   Continuous    Question Answer Comment  In the event of cardiac or respiratory ARREST Do not call a "code blue"   In the event of cardiac or respiratory ARREST Do not perform Intubation, CPR, defibrillation or ACLS   In  the event of cardiac or respiratory ARREST Use medication by any route, position, wound care, and other measures to relive pain and suffering. May use oxygen, suction and manual treatment of airway obstruction as needed for comfort.      05/12/15 2255    Code Status History    Date Active Date Inactive Code Status Order ID Comments User Context   05/12/2015 10:10 PM 05/12/2015 10:55 PM DNR [id number]  Houston Siren, MD ED   04/28/2015  8:18 PM 04/30/2015  4:08 PM DNR [id number]  Osvaldo Shipper, MD Inpatient   04/22/2015  7:00 PM 04/25/2015  4:02 PM DNR [id number]  Briscoe Deutscher, MD ED   04/12/2015  3:58 PM 04/13/2015  5:02 PM DNR [id number]  Esperanza Sheets, MD Inpatient   02/17/2015  5:23 AM 02/18/2015  6:26 PM Full Code [id number]  Houston Siren, MD Inpatient   12/22/2014  3:11 PM 12/23/2014 12:19 AM Full Code [id number]  Lennette Bihari, MD Inpatient   09/17/2014  4:48 PM 09/18/2014  5:08 PM Full Code [id number]  Gwenyth Bender, NP Inpatient    Advance Directive Documentation        Most Recent Value   Type of Advance Directive  Healthcare Power of Attorney   Pre-existing out of facility DNR order (yellow form or pink MOST form)  Physician notified to receive inpatient order   "MOST" Form in Place?        Other Directives:None  Symptom Management:   Per hospitalist  Palliative Prophylaxis:  Frequent Pain Assessment, Oral Care and Turn Reposition  Additional Recommendations (Limitations, Scope, Preferences):  Hospice at home, treat the treatable.   Psycho-social/Spiritual:  Support System: Strong Desire for further Chaplaincy support:Not discussed today.  Additional Recommendations: Education on Hospice  Prognosis: Unable to determine, less than 6 months dt dementia, frailty and repeated infections.   Discharge Planning: Home with Hospice, [facility].    Chief Complaint/ Primary Diagnoses: Present on Admission:  . Altered mental status . Acute encephalopathy . Acute kidney injury (HCC) .  Essential hypertension . Dehydration . FTT (failure to thrive) in adult  I have reviewed the medical record, interviewed the patient and family, and examined the patient. The following aspects are pertinent.  Past Medical History  Diagnosis Date  . Hypertension   . Hyperlipidemia   . Bradycardia   . Diabetes mellitus without complication (HCC)     type II  . Aortic stenosis   . Diastolic dysfunction     grade 1  . Dementia   . Atrial fibrillation (HCC)   . 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 Marital Status: Widowed    Spouse Name: N/A  . Number of Children: 3  . Years of Education: HS   Occupational History  . Retired Other   Social History Main Topics  . Smoking status: Former Smoker    Types: Pipe    Quit date: 10/21/1982  . Smokeless tobacco: Never Used  . Alcohol Use: No  . Drug Use: No  . Sexual Activity: Not Asked   Other Topics Concern  . None   Social History Narrative   Patient lives at home alone.   Caffeine Use: 2 cups daily   Family History  Problem Relation Age of Onset  . Heart disease Mother   . Dementia Neg Hx    Scheduled Meds: . amLODipine  10 mg Oral Daily  . carvedilol  6.25 mg Oral BID WC  . cefTRIAXone (ROCEPHIN)  IV  1 g Intravenous Q24H  . donepezil  5 mg Oral Daily  . feeding supplement (ENSURE ENLIVE)  237 mL Oral BID BM  . heparin  5,000 Units Subcutaneous 3 times per day  . memantine  5 mg Oral Daily  . pantoprazole  40 mg Oral BID AC  . tamsulosin  0.4 mg Oral Daily   Continuous Infusions: . dextrose 5 % and 0.9% NaCl 75 mL/hr at 05/13/15 1350   PRN Meds:.acetaminophen, oxyCODONE Medications Prior to Admission:  Prior to Admission medications   Medication Sig Start Date End Date Taking? Authorizing Provider  amLODipine (NORVASC) 10 MG tablet Take 10 mg by mouth daily.   Yes Historical Provider, MD  carvedilol (COREG) 12.5 MG tablet Take 0.5 tablets (6.25 mg total) by  mouth 2 (two) times daily with a meal. 1/2 tablet 02/01/14  Yes Lennette Bihari, MD  donepezil (ARICEPT) 5 MG tablet Take 5 mg by mouth daily. 11/23/14  Yes Historical Provider, MD  fenofibrate micronized (LOFIBRA) 134 MG capsule TAKE 1 BY MOUTH DAILY 03/22/14  Yes Lennette Bihari, MD  hydrochlorothiazide (MICROZIDE) 12.5 MG capsule Take 1 capsule (12.5 mg total) by mouth daily. 12/16/14  Yes Lennette Bihari, MD  hydrocortisone (ANUSOL-HC) 2.5 % rectal cream Place 1 application rectally 3 (three) times daily. 04/25/15  Yes Estela Isaiah Blakes, MD  insulin NPH-regular Human (NOVOLIN 70/30) (70-30) 100 UNIT/ML injection Take 12 units 2 times daily. If after 3 days, his blood  sugar is above 200 consistently, increase the units by 2 two times daily for 5 days; if his blood sugar is still above 200, increase the units by 2 again and continue for 5 more days. Continue this regimen until his blood sugars are ranging between 120 and 160. 02/18/15  Yes Elliot Cousin, MD  memantine (NAMENDA) 5 MG tablet Take 1 tablet by mouth daily. 01/04/15  Yes Historical Provider, MD  pantoprazole (PROTONIX) 40 MG tablet Take 1 tablet (40 mg total) by mouth 2 (two) times daily before a meal. 04/25/15  Yes Estela Isaiah Blakes, MD  tamsulosin (FLOMAX) 0.4 MG CAPS capsule Take 1 capsule by mouth daily. 12/28/14  Yes Historical Provider, MD  HYDROcodone-acetaminophen (NORCO) 10-325 MG tablet Take 1 tablet by mouth every 6 (six) hours as needed for moderate pain or severe pain.  01/04/15   Historical Provider, MD   No Known Allergies  Review of Systems  Unable to perform ROS: Dementia    Physical Exam  Nursing note and vitals reviewed. Constitutional: No distress.  Frail, thin, elderly  HENT:  Head: Normocephalic and atraumatic.  Cardiovascular: Normal rate.   Respiratory: Effort normal. No respiratory distress.  GI: Soft. He exhibits no distension. There is no guarding.  Neurological: He is alert.  Confused, low  speech.     Vital Signs: BP 119/44 mmHg  Pulse 60  Temp(Src) 98.2 F (36.8 C) (Oral)  Resp 15  Wt 48.5 kg (106 lb 14.8 oz)  SpO2 98%  SpO2: SpO2: 98 % O2 Device:SpO2: 98 % O2 Flow Rate: .   IO: Intake/output summary:  Intake/Output Summary (Last 24 hours) at 05/13/15 1554 Last data filed at 05/13/15 0930  Gross per 24 hour  Intake    120 ml  Output      0 ml  Net    120 ml    LBM:   Baseline Weight: Weight: 61.236 kg (135 lb) Most recent weight: Weight: 48.5 kg (106 lb 14.8 oz)      Palliative Assessment/Data:  Flowsheet Rows        Most Recent Value   Intake Tab    Referral Department  Hospitalist   Unit at Time of Referral  Med/Surg Unit   Palliative Care Primary Diagnosis  Neurology   Date Notified  05/13/15   Palliative Care Type  New Palliative care   Reason for referral  Clarify Goals of Care   Date of Admission  05/12/15   Date first seen by Palliative Care  05/13/15   # of days Palliative referral response time  0 Day(s)   # of days IP prior to Palliative referral  1   Clinical Assessment    Palliative Performance Scale Score  30%   Pain Max last 24 hours  Not able to report   Pain Min Last 24 hours  Not able to report   Dyspnea Max Last 24 Hours  Not able to report   Dyspnea Min Last 24 hours  Not able to report   Psychosocial & Spiritual Assessment    Social Work Plan of Care  Staff support, Education on Hospice   Palliative Care Outcomes    Patient/Family meeting held?  Yes   Who was at the meeting?  daughters Ellis Mehaffey, Annett Gula, and [facility]   Palliative Care Outcomes  Clarified goals of care, Counseled regarding hospice, Provided advance care planning, Provided psychosocial or spiritual support   Patient/Family wishes: Interventions discontinued/not started   Mechanical Ventilation, Trach, PEG  Palliative Care follow-up planned  Yes, Facility      Additional Data Reviewed:  CBC:    Component Value Date/Time   WBC 9.7 05/13/2015  0625   HGB 11.0* 05/13/2015 0625   HCT 33.3* 05/13/2015 0625   PLT 292 05/13/2015 0625   MCV 91.7 05/13/2015 0625   NEUTROABS 8.8* 05/12/2015 1900   LYMPHSABS 2.1 05/12/2015 1900   MONOABS 1.4* 05/12/2015 1900   EOSABS 0.1 05/12/2015 1900   BASOSABS 0.1 05/12/2015 1900   Comprehensive Metabolic Panel:    Component Value Date/Time   NA 140 05/13/2015 0625   NA 137 08/30/2014 1148   K 4.2 05/13/2015 0625   CL 105 05/13/2015 0625   CO2 26 05/13/2015 0625   BUN 31* 05/13/2015 0625   BUN 27 08/30/2014 1148   CREATININE 1.30* 05/13/2015 0625   CREATININE 1.11 12/16/2014 1021   GLUCOSE 194* 05/13/2015 0625   GLUCOSE 241* 08/30/2014 1148   CALCIUM 8.7* 05/13/2015 0625   AST 21 05/13/2015 0625   ALT 14* 05/13/2015 0625   ALKPHOS 28* 05/13/2015 0625   BILITOT 0.7 05/13/2015 0625   PROT 6.2* 05/13/2015 0625   ALBUMIN 3.2* 05/13/2015 0625     Time In: 1220 Time Out: 1340 Time Total: 80 minutes  GOC discussion shared with nursing staff, CM, SW, and Dr. Vanessa Barbara on next rounds.  Greater than 50%  of this time was spent counseling and coordinating care related to the above assessment and plan.  Signed by: Katheran Awe, NP  Katheran Awe, NP  05/13/2015, 3:54 PM  Please contact Palliative Medicine Team phone at [phone] for questions and concerns.

## 2015-05-13 NOTE — Progress Notes (Signed)
Initial Nutrition Assessment  DOCUMENTATION CODES:   Severe malnutrition in context of acute illness/injury  INTERVENTION:   Provide patient with Ensure Enlive po BID, each supplement provides 350 kcal and 20 grams of protein.  Provide patient with mechanically soft meats. Provide patient with preferred foods for meals.  Preferences have been obtained and Nutrition Services have been notified.    NUTRITION DIAGNOSIS:   Inadequate oral intake related to poor appetite, lethargy/confusion as evidenced by per patient/family report.  GOAL:   Patient will meet greater than or equal to 90% of their needs  MONITOR:   PO intake, Supplement acceptance, Labs, Weight trends  REASON FOR ASSESSMENT:   Low Braden   ASSESSMENT:   82 y.o. male With a hx of HTN, HLD, DM type 2, dementia, presents with complaints of AMS. I had the opportunity to care for him on previous admission for hematuria. During that time, his family did not want any aggressive measures done upon him, and consideration for hospice or palliative care was strongly considered. Today, workup showed WBC 12.6 and elevated creatinine 2.19. UA was positive for a UTI. Patient was lethargic and was not able to converse at all today. Hospitalist was asked to admit him for UTI, AMS, and AKI with Cr of 2.19. While in ED, patient had reported an increase in abnormal and frequent falling but denied hitting his head. He was able to speak earlier, but has since become less responsive and was having abnormal shaking. Patient has had no hx of Sz in the past.    Met with patient and family (3 daughters) at bedside.  Daughter states that pt was given cold pancakes and oatmeal and tough sausage for breakfast.  Meal completion lists 25% per chart review. She reports that he was unable to eat this so she had just returned with a breakfast sandwich from [place] for the pt. Pt was alert but pleasantly confused.  Family reports that pt has not been  eating well since he had an endoscopy about two weeks ago.  They say his clothes are very loose and he has become very thin.  Family states that he is very unstable and fell 3x yesterday.  He uses a walker but must have someone behind him because he will fall backwards.    Family reports that he has no swallowing issues and that they provide softer foods for him so that he can chew more easily.  For breakfast the pt prefers scrambled eggs and a biscuit or cold cereal (cherrios with milk). For lunch he prefers soft meats, potatoes, and soft vegetables.  In general he likes peaches, fruit cocktail, and pudding.  Patient drinks diet sodas, water, and apple juice.  Patient does not drink Ensure at home currently; however, family states that they are going to start giving him Ensure. RD will make some meal selections to ensure pt receives foods he enjoys to consume.   Nutrition Focused Physical Exam was conducted.  Patient has moderate muscle depletion, moderate fat depletion, and no edema.  Family alerted RD to pt's reddened, slightly swollen tongue which they had just noticed this morning while trying to feed the patient.  Patient seemed to be rubbing his tongue against the top of his mouth, possibly further irritating the tongue.  Family stated they had noticed a decline in his weight and that his clothes were a lot looser.   Medications reviewed. Labs: elevated glucose (194), low albumin (3.2), Low Hgb (3.63). Per chart review, on 04/28/15 patient  had stage 2 pressure ulcer on buttocks which is documented to be healing.  Moisture associated skin damage is documented currently for the groin and buttocks areas.   Discussed the importance of good meal and supplement intake to help patient regain some strength.  Diet Order:  DIET SOFT Room service appropriate?: Yes; Fluid consistency:: Thin  Skin:  Reviewed, no issues  Last BM:  PTA  Height:   Ht Readings from Last 1 Encounters:  04/28/15 5' 8"  (1.727  m)   Weight:   Wt Readings from Last 1 Encounters:  05/12/15 106 lb 14.8 oz (48.5 kg)   Ideal Body Weight:  70 kg  BMI:  Body mass index is 16.26 kg/(m^2).  Estimated Nutritional Needs:   Kcal:  1500-1700  Protein:  75-85 grams  Fluid:  1.5 - 1.7 L  EDUCATION NEEDS:   No education needs identified at this time  Veronda Prude, Dietetic Intern Pager: [phone]

## 2015-05-14 DIAGNOSIS — E860 Dehydration: Secondary | ICD-10-CM

## 2015-05-14 DIAGNOSIS — I10 Essential (primary) hypertension: Secondary | ICD-10-CM

## 2015-05-14 DIAGNOSIS — R627 Adult failure to thrive: Secondary | ICD-10-CM

## 2015-05-14 LAB — CBC
HCT: 33.9 % — ABNORMAL LOW (ref 39.0–52.0)
Hemoglobin: 11.4 g/dL — ABNORMAL LOW (ref 13.0–17.0)
MCH: 30.8 pg (ref 26.0–34.0)
MCHC: 33.6 g/dL (ref 30.0–36.0)
MCV: 91.6 fL (ref 78.0–100.0)
PLATELETS: 314 10*3/uL (ref 150–400)
RBC: 3.7 MIL/uL — AB (ref 4.22–5.81)
RDW: 13.4 % (ref 11.5–15.5)
WBC: 7 10*3/uL (ref 4.0–10.5)

## 2015-05-14 LAB — GLUCOSE, CAPILLARY
GLUCOSE-CAPILLARY: 371 mg/dL — AB (ref 65–99)
GLUCOSE-CAPILLARY: 91 mg/dL (ref 65–99)
Glucose-Capillary: 317 mg/dL — ABNORMAL HIGH (ref 65–99)
Glucose-Capillary: 79 mg/dL (ref 65–99)

## 2015-05-14 LAB — BASIC METABOLIC PANEL
Anion gap: 9 (ref 5–15)
BUN: 20 mg/dL (ref 6–20)
CHLORIDE: 101 mmol/L (ref 101–111)
CO2: 27 mmol/L (ref 22–32)
CREATININE: 1.09 mg/dL (ref 0.61–1.24)
Calcium: 8.6 mg/dL — ABNORMAL LOW (ref 8.9–10.3)
GFR calc non Af Amer: >60 mL/min (ref >60)
Glucose, Bld: 308 mg/dL — ABNORMAL HIGH (ref 65–99)
POTASSIUM: 4.2 mmol/L (ref 3.5–5.1)
SODIUM: 137 mmol/L (ref 135–145)

## 2015-05-14 LAB — URINE CULTURE: Culture: NO GROWTH

## 2015-05-14 MED ORDER — CEFUROXIME AXETIL 250 MG PO TABS: 250.0000 mg | ORAL_TABLET | Freq: Two times a day (BID) | ORAL | Status: DC

## 2015-05-14 MED ORDER — INSULIN ASPART 100 UNIT/ML ~~LOC~~ SOLN
0.0000 [IU] | Freq: Every day | SUBCUTANEOUS | Status: DC
  Administered 2015-05-15: 3 [IU] via SUBCUTANEOUS

## 2015-05-14 MED ORDER — INSULIN ASPART PROT & ASPART (70-30 MIX) 100 UNIT/ML ~~LOC~~ SUSP
12.0000 [IU] | Freq: Two times a day (BID) | SUBCUTANEOUS | Status: DC
  Administered 2015-05-16: 12 [IU] via SUBCUTANEOUS
  Filled 2015-05-14: qty 10

## 2015-05-14 MED ORDER — INSULIN ASPART 100 UNIT/ML ~~LOC~~ SOLN
0.0000 [IU] | Freq: Three times a day (TID) | SUBCUTANEOUS | Status: DC
  Administered 2015-05-14: 11 [IU] via SUBCUTANEOUS
  Administered 2015-05-14: 15 [IU] via SUBCUTANEOUS
  Administered 2015-05-15: 5 [IU] via SUBCUTANEOUS
  Administered 2015-05-16: 8 [IU] via SUBCUTANEOUS

## 2015-05-14 NOTE — Progress Notes (Signed)
CM contacted [facility].  Spoke with Dorathy Daft nurse on call nurse regarding patient.  Informed possible discharge from hospital this weekend for home.  Patient will need a home assessment and equipment.  CM paged doctor to communicate about discharge plans.  CM faxed the H&P, Discharge Summary, Demographics and DME orders to [facility].     Patient pending D/C for weekend.  CM spoke with [facility] nurse from [facility] and informed patient discharging 05/14/15.  Nurse will arrange to come to hospital for assessment or his home on 05/15/15.  Dorathy Daft also to arrange DME equipment for home.   CM notified family regarding discharge plans and nurse contacted at [facility].

## 2015-05-14 NOTE — Progress Notes (Addendum)
Patient currently has orders for discharge.  Daughter expressing conflicting needs concerning how she will take care of Patient at her home.  Bambi at [facility] getting bed and other equipment delivered to home within a few hours.  Conflicting timelines as to when pt. Will be discharged to home.

## 2015-05-14 NOTE — Discharge Summary (Addendum)
Physician Discharge Summary  GREG CRATTY ZOX:[id number] DOB: 06/08/1930 DOA: 05/12/2015  PCP: Cassell Smiles., MD  Admit date: 05/12/2015 Discharge date: 05/15/2015  Time spent: 35 minutes  Recommendations for Outpatient Follow-up:  1. Mr. Acrey's care transitioned to comfort care with home hospice services to continue providing care and support. Family memebers wishing to treat the treatable.    Discharge Diagnoses:  Principal Problem:   Acute encephalopathy Active Problems:   Acute kidney injury (HCC)   Essential hypertension   Altered mental status   Dehydration   FTT (failure to thrive) in adult   UTI (lower urinary tract infection)   Dementia with behavioral disturbance   Discharge Condition: Stable  Diet recommendation: As tolerated   Filed Weights   05/12/15 1825 05/12/15 2324  Weight: 61.236 kg (135 lb) 48.5 kg (106 lb 14.8 oz)    History of present illness:  Juan Pham is an 82 y.o. male With a hx of HTN, HLD, DM type 2, dementia, presents with complaints of AMS. I had the opportunity to care for him on previous admission for hematuria. During that time, his family did not want any aggressive measures done upon him, and consideration for hospice or palliative care was strongly considered. Today, workup showed WBC 12.6 and elevated creatinine 2.19. UA was positive for a UTI. Patient was lethargic and was not able to converse at all today. Hospitalist was asked to admit him for UTI, AMS, and AKI with Cr of 2.19. While in ED, patient had reported an increase in abnormal and frequent falling but denied hitting his head. He was able to speak earlier, but has since become less responsive and was having abnormal shaking. Patient has had no hx of Sz in the past.   Hospital Course:  Mr Copley is an 82 year old gentleman with a past medical history of advanced dementia, diabetes mellitus, hypertension, having multiple previous hospitalizations for mental status changes who  currently resides at home with his daughter. Admitted to medicine service on 05/12/2015 when he presented with acute encephalopathy/lethargy. Laboratory data revealed acute kidney injury with creatinine of 2.19. Urinalysis showed urinary tract infection. He was started on gentle IV fluid resuscitation along with empiric IV antibiotic therapy. Spoke with his daughter at bedside who is his primary caregiver. He voiced concerns for repeated hospitalizations and expressed interest and focusing his care on comfort. Palliative care was consulted. Family meeting held with palliative care on 05/13/2015. They wish to focus care of comfort while treating what can be treated. Plan for Mr Gersten to be discharged home with Home Hospice providing care and support.   Addendum Transfer orders have been written for 05/14/2015. Patient was kept overnight until medical equipment could be delivered to his home. On my evaluation on 05/15/2015 he appears stable, continues to tolerate by mouth intake. He is confused, disoriented, cannot follow commands. Plan is for him to go home with home hospice services. Prescriptions for morphine sulfate and Ativan have been printed and left in chart. He will be discharged to his home once medical equipment arrives.  Consultations:  Hospice   Discharge Exam: Filed Vitals:   05/14/15 2150 05/15/15 0421  BP: 108/44 136/73  Pulse: 74 58  Temp: 98.4 F (36.9 C) 97.7 F (36.5 C)  Resp: 19 19    General: Mr Kovalenko is awake and alert, pleasantly confused, tolerating his breakfast.  Cardiovascular: 2/6 SEM, nl S1S2 Respiratory: Normal inspiratory effort, no wheezing or rales.  Abdomen: Soft, nontender nondistended Extremities: Sarcopenic.  Discharge Instructions   Discharge Instructions    Call MD for:  difficulty breathing, headache or visual disturbances    Complete by:  As directed      Call MD for:  difficulty breathing, headache or visual disturbances    Complete by:  As  directed      Call MD for:  extreme fatigue    Complete by:  As directed      Call MD for:  extreme fatigue    Complete by:  As directed      Call MD for:  hives    Complete by:  As directed      Call MD for:  hives    Complete by:  As directed      Call MD for:  persistant dizziness or light-headedness    Complete by:  As directed      Call MD for:  persistant dizziness or light-headedness    Complete by:  As directed      Call MD for:  persistant nausea and vomiting    Complete by:  As directed      Call MD for:  persistant nausea and vomiting    Complete by:  As directed      Call MD for:  redness, tenderness, or signs of infection (pain, swelling, redness, odor or green/yellow discharge around incision site)    Complete by:  As directed      Call MD for:  redness, tenderness, or signs of infection (pain, swelling, redness, odor or green/yellow discharge around incision site)    Complete by:  As directed      Call MD for:  severe uncontrolled pain    Complete by:  As directed      Call MD for:  severe uncontrolled pain    Complete by:  As directed      Call MD for:  temperature >100.4    Complete by:  As directed      Call MD for:  temperature >100.4    Complete by:  As directed      Call MD for:    Complete by:  As directed      Call MD for:    Complete by:  As directed      Diet - low sodium heart healthy    Complete by:  As directed      Diet - low sodium heart healthy    Complete by:  As directed      Increase activity slowly    Complete by:  As directed      Increase activity slowly    Complete by:  As directed           Current Discharge Medication List    START taking these medications   Details  cefUROXime (CEFTIN) 250 MG tablet Take 1 tablet (250 mg total) by mouth 2 (two) times daily with a meal. Qty: 10 tablet, Refills: 0    LORazepam (ATIVAN) 0.5 MG tablet Take 1 tablet (0.5 mg total) by mouth every 6 (six) hours as needed for anxiety or  sedation. Qty: 20 tablet, Refills: 0    morphine 20 MG/5ML solution Take 1.3 mLs (5.2 mg total) by mouth every 4 (four) hours as needed for pain. Qty: 100 mL, Refills: 0      CONTINUE these medications which have NOT CHANGED   Details  amLODipine (NORVASC) 10 MG tablet Take 10 mg by mouth daily.    carvedilol (COREG) 12.5 MG tablet  Take 0.5 tablets (6.25 mg total) by mouth 2 (two) times daily with a meal. 1/2 tablet Qty: 90 tablet, Refills: 3    donepezil (ARICEPT) 5 MG tablet Take 5 mg by mouth daily.    fenofibrate micronized (LOFIBRA) 134 MG capsule TAKE 1 BY MOUTH DAILY Qty: 90 capsule, Refills: 2    hydrocortisone (ANUSOL-HC) 2.5 % rectal cream Place 1 application rectally 3 (three) times daily. Qty: 30 g, Refills: 0    insulin NPH-regular Human (NOVOLIN 70/30) (70-30) 100 UNIT/ML injection Take 12 units 2 times daily. If after 3 days, his blood sugar is above 200 consistently, increase the units by 2 two times daily for 5 days; if his blood sugar is still above 200, increase the units by 2 again and continue for 5 more days. Continue this regimen until his blood sugars are ranging between 120 and 160. Qty: 10 mL, Refills: 11    memantine (NAMENDA) 5 MG tablet Take 1 tablet by mouth daily.    pantoprazole (PROTONIX) 40 MG tablet Take 1 tablet (40 mg total) by mouth 2 (two) times daily before a meal. Qty: 60 tablet, Refills: 2    tamsulosin (FLOMAX) 0.4 MG CAPS capsule Take 1 capsule by mouth daily.    HYDROcodone-acetaminophen (NORCO) 10-325 MG tablet Take 1 tablet by mouth every 6 (six) hours as needed for moderate pain or severe pain.       STOP taking these medications     hydrochlorothiazide (MICROZIDE) 12.5 MG capsule        No Known Allergies Follow-up Information    Follow up with Cassell Smiles., MD In 2 weeks.   Specialty:  Internal Medicine   Contact information:   [address] [place] [address] [phone]        The results of  significant diagnostics from this hospitalization (including imaging, microbiology, ancillary and laboratory) are listed below for reference.    Significant Diagnostic Studies: Dg Chest 1 View  05/12/2015  CLINICAL DATA:  82 year old with Alzheimer's dementia found unresponsive by his family earlier today. EMS found patient with pinpoint pupils and respirations at 6 per minute. Patient responded to an intravenous dose of Narcan. EXAM: CHEST 1 VIEW COMPARISON:  04/28/2015 and earlier. FINDINGS: AP erect image was obtained. Suboptimal inspiration accounts for crowded bronchovascular markings, especially in the bases, and accentuates the cardiac silhouette. Taking this into account, cardiac silhouette upper normal in size. Lungs clear. Bronchovascular markings normal. Pulmonary vascularity normal. No visible pleural effusions. No pneumothorax. IMPRESSION: Suboptimal inspiration.  No acute cardiopulmonary disease. Electronically Signed   By: Hulan Saas M.D.   On: 05/12/2015 19:41   Dg Chest 2 View  04/28/2015  CLINICAL DATA:  Gross hematuria, generalized abdominal pain. EXAM: CHEST  2 VIEW COMPARISON:  April 12, 2015. FINDINGS: The heart size and mediastinal contours are within normal limits. No pneumothorax or pleural effusion is noted. Minimal bibasilar subsegmental atelectasis is noted secondary to hypoinflation of the lungs. The visualized skeletal structures are unremarkable. IMPRESSION: Minimal bibasilar subsegmental atelectasis secondary to hypoinflation of the lungs. Electronically Signed   By: Lupita Raider, M.D.   On: 04/28/2015 14:31   Ct Head Wo Contrast  05/12/2015  CLINICAL DATA:  Patient found unresponsive tonight. Initial encounter. EXAM: CT HEAD WITHOUT CONTRAST TECHNIQUE: Contiguous axial images were obtained from the base of the skull through the vertex without intravenous contrast. COMPARISON:  Head CT scan 04/26/2015. FINDINGS: There is atrophy and chronic microvascular ischemic  change. No evidence of acute  intracranial abnormality including hemorrhage, infarct, mass lesion, mass effect, midline shift or abnormal extra-axial fluid collection is seen. No hydrocephalus or pneumocephalus. The calvarium is intact. Imaged paranasal sinuses and mastoid air cells are clear. IMPRESSION: No acute abnormality. Atrophy and chronic microvascular ischemic change. Electronically Signed   By: Drusilla Kanner M.D.   On: 05/12/2015 19:39   Ct Head Wo Contrast  04/26/2015  CLINICAL DATA:  Vomiting for 3 days with severe headaches after vomiting. Recent esophageal dilatation and hemorrhoid resection yesterday. EXAM: CT HEAD WITHOUT CONTRAST TECHNIQUE: Contiguous axial images were obtained from the base of the skull through the vertex without intravenous contrast. COMPARISON:  April 12, 2015 FINDINGS: Paranasal sinuses, mastoid air cells, and bones are within normal limits. The extracranial soft tissues are unremarkable. No subdural, epidural, or subarachnoid hemorrhage. No mass, mass effect, or midline shift. Ventricles and sulci are prominent but stable. Cerebellum, brainstem, and basal cisterns are unchanged as well. No acute cortical ischemia or infarct. IMPRESSION: No acute intracranial process identified to explain the patient's headaches. Electronically Signed   By: Gerome Sam III M.D   On: 04/26/2015 11:49   Ct Abdomen Pelvis W Contrast  04/26/2015  CLINICAL DATA:  Right-sided abdominal pain and vomiting EXAM: CT ABDOMEN AND PELVIS WITH CONTRAST TECHNIQUE: Multidetector CT imaging of the abdomen and pelvis was performed using the standard protocol following bolus administration of intravenous contrast. CONTRAST:  OMNIPAQUE IOHEXOL 300 MG/ML SOLN, 25mL OMNIPAQUE IOHEXOL 300 MG/ML SOLN COMPARISON:  07/18/2009 FINDINGS: Lower chest and abdominal wall: Bulky aortic valve calcification with thickened appearance of left ventricular walls. Left inguinal hernia which chronically contains  nonobstructed or inflamed sigmoid colon. Hepatobiliary: No focal liver abnormality.No evidence of biliary obstruction or stone. Pancreas: Atrophy without inflammation or duct enlargement. Spleen: Unremarkable. Adrenals/Urinary Tract: Mildly lobulated adrenal glands without measurable nodule. Punctate stone in the lower pole left kidney. No hydronephrosis or ureteral calculus. Bladder is moderately full with chronic wall thickening. Reproductive:Chronic marked enlargement of the prostate gland deforming the bladder base. Stomach/Bowel:  No obstruction. No appendicitis. Vascular/Lymphatic: Extensive atheromatous wall thickening and calcification of the aorta and branch vessels with celiac and SMA origin stenosis. There is also prominent plaque at the celiac bifurcation. No mass or adenopathy. Peritoneal: No ascites or pneumoperitoneum. Musculoskeletal: No acute abnormalities. IMPRESSION: 1. No acute finding. 2. Chronic left inguinal hernia containing nonobstructed sigmoid colon. 3. Bulky aortic valve calcification. If no previous echocardiography suggest outpatient follow-up to evaluate for stenosis. 4. Marked prostatomegaly with chronic outlet obstruction. The bladder is moderately distended currently. Electronically Signed   By: Marnee Spring M.D.   On: 04/26/2015 11:47    Microbiology: Recent Results (from the past 240 hour(s))  Urine culture     Status: None   Collection Time: 05/12/15  7:00 PM  Result Value Ref Range Status   Specimen Description URINE, CATHETERIZED  Final   Special Requests NONE  Final   Culture   Final    NO GROWTH 1 DAY Performed at [facility]    Report Status 05/14/2015 FINAL  Final  Blood Culture (routine x 2)     Status: None (Preliminary result)   Collection Time: 05/12/15  8:02 PM  Result Value Ref Range Status   Specimen Description BLOOD LEFT FOREARM  Final   Special Requests BOTTLES DRAWN AEROBIC ONLY 6CC  Final   Culture NO GROWTH < 12 HOURS  Final    Report Status PENDING  Incomplete  Blood Culture (routine x 2)  Status: None (Preliminary result)   Collection Time: 05/12/15  8:12 PM  Result Value Ref Range Status   Specimen Description BLOOD LEFT HAND  Final   Special Requests BOTTLES DRAWN AEROBIC ONLY 6CC  Final   Culture NO GROWTH < 12 HOURS  Final   Report Status PENDING  Incomplete     Labs: Basic Metabolic Panel:  Recent Labs Lab 05/12/15 1900 05/13/15 0625 05/14/15 0542  NA 139 140 137  K 4.1 4.2 4.2  CL 98* 105 101  CO2 GLUCOSE 136* 194* 308*  BUN 41* 31* 20  CREATININE 2.19* 1.30* 1.09  CALCIUM 10.0 8.7* 8.6*   Liver Function Tests:  Recent Labs Lab 05/12/15 1900 05/13/15 0625  AST 22 21  ALT 16* 14*  ALKPHOS 34* 28*  BILITOT 1.1 0.7  PROT 7.4 6.2*  ALBUMIN 4.0 3.2*   No results for input(s): LIPASE, AMYLASE in the last 168 hours.  Recent Labs Lab 05/12/15 1900  AMMONIA 19   CBC:  Recent Labs Lab 05/12/15 1900 05/13/15 0625 05/14/15 0542  WBC 12.6* 9.7 7.0  NEUTROABS 8.8*  --   --   HGB 12.7* 11.0* 11.4*  HCT 38.1* 33.3* 33.9*  MCV 90.9 91.7 91.6  PLT 348 292 314   Cardiac Enzymes:  Recent Labs Lab 05/12/15 1900  CKTOTAL 114   BNP: BNP (last 3 results)  Recent Labs  04/22/15 1700  BNP 79.0    ProBNP (last 3 results) No results for input(s): PROBNP in the last 8760 hours.  CBG:  Recent Labs Lab 05/12/15 1858 05/14/15 0941 05/14/15 1131 05/14/15 1826 05/14/15 2155  GLUCAP 118* 317* 79 371* 91       Signed:  Jeralyn Bennett MD.  [facility] 05/15/2015, 7:05 AM

## 2015-05-15 LAB — GLUCOSE, CAPILLARY
GLUCOSE-CAPILLARY: 191 mg/dL — AB (ref 65–99)
GLUCOSE-CAPILLARY: 293 mg/dL — AB (ref 65–99)
Glucose-Capillary: 209 mg/dL — ABNORMAL HIGH (ref 65–99)
Glucose-Capillary: 199 mg/dL — ABNORMAL HIGH (ref 65–99)

## 2015-05-15 MED ORDER — LORAZEPAM 2 MG/ML IJ SOLN: 0.5000 mg | INTRAMUSCULAR | Status: DC | PRN

## 2015-05-15 MED ORDER — MORPHINE SULFATE (PF) 2 MG/ML IV SOLN
2.0000 mg | INTRAVENOUS | Status: DC | PRN
Start: 2015-05-15 — End: 2015-05-16

## 2015-05-15 MED ORDER — LORAZEPAM 0.5 MG PO TABS: 0.5000 mg | ORAL_TABLET | Freq: Four times a day (QID) | ORAL | Status: DC | PRN

## 2015-05-15 MED ORDER — MORPHINE SULFATE 20 MG/5ML PO SOLN: 5.0000 mg | ORAL | Status: DC | PRN

## 2015-05-15 NOTE — Progress Notes (Addendum)
Daughter, who is POA, requesting comfort measure only and placed in a SNF or Hospice.  Dr. Vanessa Barbara paged for notification.

## 2015-05-15 NOTE — Progress Notes (Signed)
CM spoke with daughter Elita Quick [facility]) [phone] regarding patient wants to communicate about receiving care at a Hospice Care Facility.  Doctor and Nurse notified.

## 2015-05-16 ENCOUNTER — Inpatient Hospital Stay (HOSPITAL_COMMUNITY)
Admission: AD | Admit: 2015-05-16 | Discharge: 2015-05-19 | DRG: 682 | Disposition: A | Discharge status: Hospice | Source: Hospice | Attending: Internal Medicine | Admitting: Internal Medicine

## 2015-05-16 DIAGNOSIS — N179 Acute kidney failure, unspecified: Secondary | ICD-10-CM | POA: Diagnosis not present

## 2015-05-16 DIAGNOSIS — Z66 Do not resuscitate: Secondary | ICD-10-CM | POA: Diagnosis present

## 2015-05-16 DIAGNOSIS — E785 Hyperlipidemia, unspecified: Secondary | ICD-10-CM | POA: Diagnosis present

## 2015-05-16 DIAGNOSIS — Z794 Long term (current) use of insulin: Secondary | ICD-10-CM

## 2015-05-16 DIAGNOSIS — I10 Essential (primary) hypertension: Secondary | ICD-10-CM | POA: Diagnosis present

## 2015-05-16 DIAGNOSIS — Z8249 Family history of ischemic heart disease and other diseases of the circulatory system: Secondary | ICD-10-CM | POA: Diagnosis not present

## 2015-05-16 DIAGNOSIS — N400 Enlarged prostate without lower urinary tract symptoms: Secondary | ICD-10-CM | POA: Diagnosis not present

## 2015-05-16 DIAGNOSIS — Z515 Encounter for palliative care: Secondary | ICD-10-CM | POA: Diagnosis not present

## 2015-05-16 DIAGNOSIS — Z87891 Personal history of nicotine dependence: Secondary | ICD-10-CM

## 2015-05-16 DIAGNOSIS — I4891 Unspecified atrial fibrillation: Secondary | ICD-10-CM | POA: Diagnosis present

## 2015-05-16 DIAGNOSIS — R627 Adult failure to thrive: Secondary | ICD-10-CM | POA: Diagnosis present

## 2015-05-16 DIAGNOSIS — E119 Type 2 diabetes mellitus without complications: Secondary | ICD-10-CM | POA: Diagnosis present

## 2015-05-16 DIAGNOSIS — G9340 Encephalopathy, unspecified: Secondary | ICD-10-CM | POA: Diagnosis not present

## 2015-05-16 DIAGNOSIS — R4182 Altered mental status, unspecified: Secondary | ICD-10-CM | POA: Diagnosis present

## 2015-05-16 DIAGNOSIS — E860 Dehydration: Secondary | ICD-10-CM | POA: Diagnosis not present

## 2015-05-16 DIAGNOSIS — F0391 Unspecified dementia with behavioral disturbance: Secondary | ICD-10-CM | POA: Diagnosis present

## 2015-05-16 DIAGNOSIS — I350 Nonrheumatic aortic (valve) stenosis: Secondary | ICD-10-CM | POA: Diagnosis present

## 2015-05-16 DIAGNOSIS — F03918 Unspecified dementia, unspecified severity, with other behavioral disturbance: Secondary | ICD-10-CM | POA: Diagnosis present

## 2015-05-16 DIAGNOSIS — N390 Urinary tract infection, site not specified: Secondary | ICD-10-CM | POA: Diagnosis present

## 2015-05-16 LAB — GLUCOSE, CAPILLARY: GLUCOSE-CAPILLARY: 281 mg/dL — AB (ref 65–99)

## 2015-05-16 MED ORDER — MORPHINE SULFATE (PF) 2 MG/ML IV SOLN
2.0000 mg | INTRAVENOUS | Status: DC | PRN
Start: 2015-05-16 — End: 2015-05-19
  Administered 2015-05-16 – 2015-05-18 (×10): 2 mg via INTRAVENOUS
  Filled 2015-05-16 (×10): qty 1

## 2015-05-16 MED ORDER — INSULIN ASPART 100 UNIT/ML ~~LOC~~ SOLN: 0.0000 [IU] | Freq: Every day | SUBCUTANEOUS | Status: DC

## 2015-05-16 MED ORDER — MORPHINE SULFATE (CONCENTRATE) 10 MG/0.5ML PO SOLN: 2.5000 mg | ORAL | Status: DC | PRN

## 2015-05-16 MED ORDER — DIPHENHYDRAMINE HCL 50 MG/ML IJ SOLN: 12.5000 mg | INTRAMUSCULAR | Status: DC | PRN

## 2015-05-16 MED ORDER — INSULIN NPH (HUMAN) (ISOPHANE) 100 UNIT/ML ~~LOC~~ SUSP
12.0000 [IU] | Freq: Two times a day (BID) | SUBCUTANEOUS | Status: DC
  Administered 2015-05-17: 12 [IU] via SUBCUTANEOUS
  Filled 2015-05-16: qty 10

## 2015-05-16 MED ORDER — INSULIN ASPART 100 UNIT/ML ~~LOC~~ SOLN: 0.0000 [IU] | Freq: Three times a day (TID) | SUBCUTANEOUS | Status: DC

## 2015-05-16 MED ORDER — HALOPERIDOL 0.5 MG PO TABS
0.5000 mg | ORAL_TABLET | ORAL | Status: DC | PRN
  Filled 2015-05-16: qty 1

## 2015-05-16 MED ORDER — MORPHINE SULFATE 10 MG/5ML PO SOLN: 2.5000 mg | ORAL | Status: DC | PRN

## 2015-05-16 MED ORDER — AMLODIPINE BESYLATE 5 MG PO TABS
10.0000 mg | ORAL_TABLET | Freq: Every day | ORAL | Status: DC
  Administered 2015-05-17: 10 mg via ORAL
  Filled 2015-05-16 (×2): qty 2

## 2015-05-16 MED ORDER — ONDANSETRON 4 MG PO TBDP: 4.0000 mg | ORAL_TABLET | Freq: Four times a day (QID) | ORAL | Status: DC | PRN

## 2015-05-16 MED ORDER — HALOPERIDOL LACTATE 5 MG/ML IJ SOLN: 0.5000 mg | INTRAMUSCULAR | Status: DC | PRN

## 2015-05-16 MED ORDER — HALOPERIDOL LACTATE 2 MG/ML PO CONC
0.5000 mg | ORAL | Status: DC | PRN
  Filled 2015-05-16: qty 0.3

## 2015-05-16 MED ORDER — MORPHINE SULFATE (PF) 2 MG/ML IV SOLN
2.0000 mg | INTRAVENOUS | Status: DC | PRN
  Administered 2015-05-16 (×2): 2 mg via INTRAVENOUS
  Filled 2015-05-16 (×2): qty 1

## 2015-05-16 MED ORDER — ATROPINE SULFATE 1 % OP SOLN
4.0000 [drp] | OPHTHALMIC | Status: DC | PRN
  Administered 2015-05-17: 4 [drp] via SUBLINGUAL
  Filled 2015-05-16: qty 2

## 2015-05-16 MED ORDER — ONDANSETRON HCL 4 MG/2ML IJ SOLN: 4.0000 mg | Freq: Four times a day (QID) | INTRAMUSCULAR | Status: DC | PRN

## 2015-05-16 MED ORDER — LORAZEPAM 2 MG/ML IJ SOLN
1.0000 mg | INTRAMUSCULAR | Status: DC | PRN
  Administered 2015-05-18: 1 mg via INTRAVENOUS
  Filled 2015-05-16: qty 1

## 2015-05-16 NOTE — Progress Notes (Signed)
[facility] PROGRESS NOTE  Juan Pham BJY:[id number] DOB: [birth year] DOA: 05/16/2015 PCP: Cassell Smiles., MD  Assessment/Plan: 1. Acute encephalopathy -Juan Pham is a pleasant 82 year old gentleman with a history of advanced dementia having repeated hospitalizations, admitted overnight for mental status changes. Symptoms likely secondary to urinary tract infection and dehydration from minimal by mouth intake.  2.  Acute kidney injury  3.  Urinary tract infection. -Urinalysis showed the presence of many bacteria with nitrates.  4.  Goals of care -After having multiple family meetings, the goal is to pursue comfort care in the inpatient hospice setting. Initially family members wanted to take him home with home hospice services, however, his daughter reported feeling overwhelmed with his care and the burden of being the sole caregiver was beginning to take its toll on her. She stated feeling guilty about transitioning to skilled nursing facility with hospice following. -After multiple meetings family members have agreed to pursue inpatient hospice placement. You're interested in hospice of [facility]. -He was made GIP until bed becomes available.   Code Status: DNR Family Communication: I spoke with his daughter at bedside Disposition Plan: Plan for inpatient hospice facility placement   Consultants:  Palliative Care  HPI/Subjective: Juan Pham is an 82 year old gentleman with a past medical history of advanced dementia, diabetes mellitus, hypertension, having multiple previous hospitalizations for mental status changes who currently resides at home with his daughter. Admitted to medicine service on 05/12/2015 when he presented with acute encephalopathy/lethargy. Laboratory data revealed acute kidney injury with creatinine of 2.19. Urinalysis showed urinary tract infection. He was started on gentle IV fluid resuscitation along with empiric IV antibiotic therapy. Spoke with  his daughter at bedside who is his primary caregiver. He voiced concerns for repeated hospitalizations and expressed interest and focusing his care on comfort. Palliative care was consulted.  Objective: There were no vitals filed for this visit. No intake or output data in the 24 hours ending 05/16/15 1715 There were no vitals filed for this visit.  Exam:   General:  He is confused and disoriented, although appears comfortable and in no acute distress  Cardiovascular: 3-6 systolic ejection murmur  Respiratory: Normal respiratory effort lungs are clear  Abdomen: Soft nontender nondistended  Musculoskeletal: Sarcopenia  Data Reviewed: Basic Metabolic Panel:  Recent Labs Lab 05/12/15 1900 05/13/15 0625 05/14/15 0542  NA 139 140 137  K 4.1 4.2 4.2  CL 98* 105 101  CO2 GLUCOSE 136* 194* 308*  BUN 41* 31* 20  CREATININE 2.19* 1.30* 1.09  CALCIUM 10.0 8.7* 8.6*   Liver Function Tests:  Recent Labs Lab 05/12/15 1900 05/13/15 0625  AST 22 21  ALT 16* 14*  ALKPHOS 34* 28*  BILITOT 1.1 0.7  PROT 7.4 6.2*  ALBUMIN 4.0 3.2*   No results for input(s): LIPASE, AMYLASE in the last 168 hours.  Recent Labs Lab 05/12/15 1900  AMMONIA 19   CBC:  Recent Labs Lab 05/12/15 1900 05/13/15 0625 05/14/15 0542  WBC 12.6* 9.7 7.0  NEUTROABS 8.8*  --   --   HGB 12.7* 11.0* 11.4*  HCT 38.1* 33.3* 33.9*  MCV 90.9 91.7 91.6  PLT 348 292 314   Cardiac Enzymes:  Recent Labs Lab 05/12/15 1900  CKTOTAL 114   BNP (last 3 results)  Recent Labs  04/22/15 1700  BNP 79.0    ProBNP (last 3 results) No results for input(s): PROBNP in the last 8760 hours.  CBG:  Recent Labs Lab 05/15/15 [phone]  05/15/15 1143 05/15/15 1555 05/15/15 2128 05/16/15 0811  GLUCAP 209* 191* 199* 293* 281*    Recent Results (from the past 240 hour(s))  Urine culture     Status: None   Collection Time: 05/12/15  7:00 PM  Result Value Ref Range Status   Specimen Description  URINE, CATHETERIZED  Final   Special Requests NONE  Final   Culture   Final    NO GROWTH 1 DAY Performed at [facility]    Report Status 05/14/2015 FINAL  Final  Blood Culture (routine x 2)     Status: None (Preliminary result)   Collection Time: 05/12/15  8:02 PM  Result Value Ref Range Status   Specimen Description BLOOD LEFT FOREARM  Final   Special Requests BOTTLES DRAWN AEROBIC ONLY 6CC  Final   Culture NO GROWTH 4 DAYS  Final   Report Status PENDING  Incomplete  Blood Culture (routine x 2)     Status: None (Preliminary result)   Collection Time: 05/12/15  8:12 PM  Result Value Ref Range Status   Specimen Description BLOOD LEFT HAND  Final   Special Requests BOTTLES DRAWN AEROBIC ONLY 6CC  Final   Culture NO GROWTH 4 DAYS  Final   Report Status PENDING  Incomplete     Studies: No results found.  Scheduled Meds: . amLODipine  10 mg Oral Daily  . insulin NPH Human  12 Units Subcutaneous BID AC & HS   Continuous Infusions:    Active Problems:   * No active hospital problems. *    Time spent: 15 min    Juan Pham  [facility] Pager [phone]. If 7PM-7AM, please contact night-coverage at [email or web address], password [facility] 05/16/2015, 5:15 PM  LOS: 0 days

## 2015-05-16 NOTE — Progress Notes (Signed)
Daily Progress Note   Patient Name: Juan Pham       Date: 05/16/2015 DOB: [birth year]  Age: 82 y.o. MRN#: [id number] Attending Physician: Jeralyn Bennett, MD Primary Care Physician: Cassell Smiles., MD Admit Date: 05/12/2015  Reason for Consultation/Follow-up: Disposition, Hospice Evaluation, Inpatient hospice referral, Psychosocial/spiritual support and Withdrawal of life-sustaining treatment  Subjective: Mr. Kings is resting quietly in bed. He does not wake or move as I examine him.  Family meeting today with daughters Docia Furl, and Angelia.  Family shares the desire for inpatient hospice. They agree that the goal is to not treat the next infection, do not really hospitalize, (he has had 5 hospital admissions in the last 6 months), no more IV fluids, no more IV antibiotics. We talk about comfort measures only, including stopping the medications for his heart and blood sugar. Family and is in agreement with stopping all medications that do not focus on comfort. They shared that this weekend he had periods of wakefulness and periods of lethargy. Today he does not respond to me in any meaningful way, although he has had a dose of PRN morphine.  Length of Stay: 4 days  Current Medications: Scheduled Meds:  . amLODipine  10 mg Oral Daily  . carvedilol  6.25 mg Oral BID WC  . feeding supplement (ENSURE ENLIVE)  237 mL Oral BID BM  . insulin aspart protamine- aspart  12 Units Subcutaneous BID WC    Continuous Infusions:    PRN Meds: acetaminophen, LORazepam, morphine injection, oxyCODONE  Physical Exam: Physical Exam  Constitutional: No distress.  Sleeping during exam  HENT:  Head: Normocephalic and atraumatic.  Cardiovascular: Normal rate.   Pulmonary/Chest: No respiratory  distress.  Abdominal: Soft.  Neurological:  lethargic                Vital Signs: BP 144/41 mmHg  Pulse 70  Temp(Src) 98.1 F (36.7 C) (Oral)  Resp 20  Wt 48.5 kg (106 lb 14.8 oz)  SpO2 100% SpO2: SpO2: 100 % O2 Device: O2 Device: Not Delivered O2 Flow Rate:    Intake/output summary:  Intake/Output Summary (Last 24 hours) at 05/16/15 1329 Last data filed at 05/16/15 0144  Gross per 24 hour  Intake    240 ml  Output    501 ml  Net   -  261 ml   LBM: Last BM Date: 05/14/15 Baseline Weight: Weight: 61.236 kg (135 lb) Most recent weight: Weight: 48.5 kg (106 lb 14.8 oz)       Palliative Assessment/Data: Flowsheet Rows        Most Recent Value   Intake Tab    Referral Department  Hospitalist   Unit at Time of Referral  Med/Surg Unit   Palliative Care Primary Diagnosis  Neurology   Date Notified  05/13/15   Palliative Care Type  New Palliative care   Reason for referral  Clarify Goals of Care   Date of Admission  05/12/15   Date first seen by Palliative Care  05/13/15   # of days Palliative referral response time  0 Day(s)   # of days IP prior to Palliative referral  1   Clinical Assessment    Palliative Performance Scale Score  30%   Pain Max last 24 hours  Not able to report   Pain Min Last 24 hours  Not able to report   Dyspnea Max Last 24 Hours  Not able to report   Dyspnea Min Last 24 hours  Not able to report   Psychosocial & Spiritual Assessment    Social Work Plan of Care  Staff support, Education on Hospice   Palliative Care Outcomes    Patient/Family meeting held?  Yes   Who was at the meeting?  daughters Ashton Belote, Annett Gula, and Christy   Palliative Care Outcomes  Clarified goals of care, Counseled regarding hospice, Provided advance care planning, Provided psychosocial or spiritual support   Patient/Family wishes: Interventions discontinued/not started   Mechanical Ventilation, Trach, PEG   Palliative Care follow-up planned  Yes, Facility       Additional Data Reviewed: CBC    Component Value Date/Time   WBC 7.0 05/14/2015 0542   RBC 3.70* 05/14/2015 0542   HGB 11.4* 05/14/2015 0542   HCT 33.9* 05/14/2015 0542   PLT 314 05/14/2015 0542   MCV 91.6 05/14/2015 0542   MCH 30.8 05/14/2015 0542   MCHC 33.6 05/14/2015 0542   RDW 13.4 05/14/2015 0542   LYMPHSABS 2.1 05/12/2015 1900   MONOABS 1.4* 05/12/2015 1900   EOSABS 0.1 05/12/2015 1900   BASOSABS 0.1 05/12/2015 1900    CMP     Component Value Date/Time   NA 137 05/14/2015 0542   NA 137 08/30/2014 1148   K 4.2 05/14/2015 0542   CL 101 05/14/2015 0542   CO2 27 05/14/2015 0542   GLUCOSE 308* 05/14/2015 0542   GLUCOSE 241* 08/30/2014 1148   BUN 20 05/14/2015 0542   BUN 27 08/30/2014 1148   CREATININE 1.09 05/14/2015 0542   CREATININE 1.11 12/16/2014 1021   CALCIUM 8.6* 05/14/2015 0542   PROT 6.2* 05/13/2015 0625   ALBUMIN 3.2* 05/13/2015 0625   AST 21 05/13/2015 0625   ALT 14* 05/13/2015 0625   ALKPHOS 28* 05/13/2015 0625   BILITOT 0.7 05/13/2015 0625   GFRNONAA >60 05/14/2015 0542   GFRAA >60 05/14/2015 0542       Problem List:  Patient Active Problem List   Diagnosis Date Noted  . Acute kidney injury (HCC) 05/13/2015  . Dehydration 05/13/2015  . FTT (failure to thrive) in adult 05/13/2015  . UTI (lower urinary tract infection)   . Dementia with behavioral disturbance   . Pressure ulcer 04/29/2015  . Hematuria 04/28/2015  . Dysphagia, idiopathic 04/24/2015  . Hematochezia 04/22/2015  . Lower GI bleed 04/22/2015  . Insulin dependent  diabetes mellitus (HCC)   . Altered mental status 04/12/2015  . Confusion 04/12/2015  . 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 Bradycardia 02/17/2015  . Hypothermia 02/17/2015  . Depression 01/23/2015  . Dementia   . Aortic stenosis, severe   . Severe aortic valve stenosis 12/18/2014  . Hypertensive heart disease 12/18/2014  . Hyperlipidemia LDL goal <70 12/18/2014  . Acute encephalopathy  09/17/2014  . Diabetes mellitus without complication (HCC)   . Aortic stenosis, severe   . GERD (gastroesophageal reflux disease) 04/21/2013  . Varicose vein 04/21/2013  . Essential hypertension 10/20/2012  . Hyperlipidemia 10/20/2012  . Stiffness of joints, not elsewhere classified, multiple sites 09/02/2012     Palliative Care Assessment & Plan    1.Code Status:  DNR    Code Status Orders        Start     Ordered   05/12/15 2256  Do not attempt resuscitation (DNR)   Continuous    Question Answer Comment  In the event of cardiac or respiratory ARREST Do not call a "code blue"   In the event of cardiac or respiratory ARREST Do not perform Intubation, CPR, defibrillation or ACLS   In the event of cardiac or respiratory ARREST Use medication by any route, position, wound care, and other measures to relive pain and suffering. May use oxygen, suction and manual treatment of airway obstruction as needed for comfort.      05/12/15 2255    Code Status History    Date Active Date Inactive Code Status Order ID Comments User Context   05/12/2015 10:10 PM 05/12/2015 10:55 PM DNR [id number]  Houston Siren, MD ED   04/28/2015  8:18 PM 04/30/2015  4:08 PM DNR [id number]  Osvaldo Shipper, MD Inpatient   04/22/2015  7:00 PM 04/25/2015  4:02 PM DNR [id number]  Briscoe Deutscher, MD ED   04/12/2015  3:58 PM 04/13/2015  5:02 PM DNR [id number]  Esperanza Sheets, MD Inpatient   02/17/2015  5:23 AM 02/18/2015  6:26 PM Full Code [id number]  Houston Siren, MD Inpatient   12/22/2014  3:11 PM 12/23/2014 12:19 AM Full Code [id number]  Lennette Bihari, MD Inpatient   09/17/2014  4:48 PM 09/18/2014  5:08 PM Full Code [id number]  Gwenyth Bender, NP Inpatient    Advance Directive Documentation        Most Recent Value   Type of Advance Directive  Healthcare Power of Attorney   Pre-existing out of facility DNR order (yellow form or pink MOST form)  Physician notified to receive inpatient order   "MOST" Form in Place?         2. Goals  of Care/Additional Recommendations:  Full comfort measures.   Limitations on Scope of Treatment: Full Comfort Care  Desire for further Chaplaincy support:Ongoing.   Psycho-social Needs: Grief/Bereavement Support  3. Symptom Management:      1. Per Hospice protocol.   4. Palliative Prophylaxis:   Bowel Regimen, Frequent Pain Assessment, Oral Care and Turn Reposition  5. Prognosis: < 4 weeks, with families desire to not treat the next infection, no IV fluids, no IV antibiotics and dc of medications that do not support comfort only.   6. Discharge Planning:  Hospice facility [facility] hospice requested.   Care plan was discussed with nursing staff, case manager, social worker, and Dr. Vanessa Barbara.   Thank you for allowing the Palliative Medicine Team to assist in the care of this patient.   Time In: 1100 Time Out: 1150  Total Time 50 minutre Prolonged Time Billed  yes         Katheran Awe, NP  05/16/2015, 1:29 PM  Please contact Palliative Medicine Team phone at [phone] for questions and concerns.

## 2015-05-16 NOTE — Progress Notes (Signed)
Hospice SN and CPoteat, BSW met with the pt's family to discuss hospice services and philosophy.  HMB was elected and consent forms were signed.  Family is aware that the pt will remain at [facility] until a bed becomes available at the [facility].  Family is aware that the pt will receive only comfort measures once transfer to the hospice home is complete.  IA completed.  S/S of approaching death booklet given to family.  On-call magnet given and reviewed with the family.  On-call sticker placed on the front of the chart.  Instructed hospital staff and family to notify [facility] for any concerns or questions.  SN spoke with Sabas Sous, NP for Palliative Care about the pt's condition.

## 2015-05-16 NOTE — Care Management Note (Signed)
Case Management Note  Patient Details  Name: Juan Pham MRN: [id number] Date of Birth: 10/27/1930   Expected Discharge Date:                  Expected Discharge Plan:  Home w Hospice Care  In-House Referral:     Discharge planning Services  CM Consult  Post Acute Care Choice:  Hospice Choice offered to:  Adult Children  DME Arranged:    DME Agency:     HH Arranged:    HH Agency:     Status of Service:  Completed, signed off  Medicare Important Message Given:  Yes Date Medicare IM Given:    Medicare IM give by:    Date Additional Medicare IM Given:    Additional Medicare Important Message give by:     If discussed at Long Length of Stay Meetings, dates discussed:    Additional Comments: Pt's family now requesting pt be discharged to hospice facility. Family has requested [facility]. Pt info faxed and CM called to verify info received. Hospice facility has no beds available at this time, pt made GIP until bed available. No further CM needs.   Malcolm Metro, RN 05/16/2015, 3:01 PM

## 2015-05-17 ENCOUNTER — Encounter (HOSPITAL_COMMUNITY): Payer: Self-pay | Admitting: *Deleted

## 2015-05-17 DIAGNOSIS — E860 Dehydration: Secondary | ICD-10-CM

## 2015-05-17 DIAGNOSIS — F0391 Unspecified dementia with behavioral disturbance: Secondary | ICD-10-CM

## 2015-05-17 DIAGNOSIS — R627 Adult failure to thrive: Secondary | ICD-10-CM

## 2015-05-17 DIAGNOSIS — Z515 Encounter for palliative care: Secondary | ICD-10-CM

## 2015-05-17 DIAGNOSIS — N179 Acute kidney failure, unspecified: Principal | ICD-10-CM

## 2015-05-17 LAB — GLUCOSE, CAPILLARY: Glucose-Capillary: 308 mg/dL — ABNORMAL HIGH (ref 65–99)

## 2015-05-17 LAB — CULTURE, BLOOD (ROUTINE X 2)
CULTURE: NO GROWTH
Culture: NO GROWTH

## 2015-05-17 MED ORDER — ATROPINE SULFATE 1 % OP SOLN
4.0000 [drp] | OPHTHALMIC | Status: AC | PRN
Start: 2015-05-17 — End: ?

## 2015-05-17 MED ORDER — MORPHINE SULFATE (PF) 2 MG/ML IV SOLN: 2.0000 mg | INTRAVENOUS | Status: DC | PRN

## 2015-05-17 MED ORDER — LORAZEPAM 2 MG/ML IJ SOLN: 1.0000 mg | INTRAMUSCULAR | Status: DC | PRN

## 2015-05-17 MED ORDER — HALOPERIDOL LACTATE 5 MG/ML IJ SOLN: 0.5000 mg | INTRAMUSCULAR | Status: DC | PRN

## 2015-05-17 MED ORDER — HALOPERIDOL 0.5 MG PO TABS
0.5000 mg | ORAL_TABLET | ORAL | Status: AC | PRN
Start: 2015-05-17 — End: ?

## 2015-05-17 NOTE — Progress Notes (Signed)
SN visit made.  Pt lying in bed in no apparent distress.  Alert to self.  Pt is calm and cooperative.  Pt states that he is having mild pain in his right lower leg.  No family at bedside at this time.  VS:  BP 128/59, 62, 14, 98.8, 98% RA.  Lungs diminished bilaterally.  Abdomen flat and nontender with hypoactive BS x 4.  Appetite poor.  Skin: WNL.  Left forearm PIV WNL.  Reviewed pt health status and medications with Val, LPN.  Instructed Val, LPN on discharge plan to move pt to hospice home as soon as a bed becomes available.  Val, LPN verbalized instructions.  Instructed Val, LPN on 09/8 hospice availability.  Val, LPN verbalized understanding.  Will continue to monitor.

## 2015-05-17 NOTE — H&P (Signed)
[facility] History and Physical  JAEKWON MCCLUNE NFA:[id number] DOB: 06/14/1930    PCP:   Cassell Smiles., MD   Chief Complaint: Altered mental status  HPI: Mr Vanwart is an 82 year old gentleman with a past medical history of end-stage dementia, diabetes mellitus, hypertension, having multiple previous hospitalizations for mental status changes who currently resides at home with his daughter. Admitted to medicine service on 05/12/2015 when he presented with acute encephalopathy/lethargy. Laboratory data revealed acute kidney injury with creatinine of 2.19. Urinalysis showed urinary tract infection. He was started on gentle IV fluid resuscitation along with empiric IV antibiotic therapy. Spoke with his daughter at bedside who is his primary caregiver. He voiced concerns for repeated hospitalizations and expressed interest and focusing his care on comfort. Palliative care was consulted. Family meeting held with palliative care on 05/13/2015. They wish to focus care of comfort while treating what can be treated. After having multiple family meetings, the goal is to pursue comfort care in the inpatient hospice setting. Initially family members wanted to take him home with home hospice services, however, his daughter reported feeling overwhelmed with his care and the burden of being the sole caregiver was beginning to take its toll on her. She stated feeling guilty about transitioning to skilled nursing facility with hospice following. Family members interested in hospice of [place]. He was made GIP until bed becomes available.   Rewiew of Systems: Unable.   Past Medical History  Diagnosis Date  . Hypertension   . Hyperlipidemia   . Bradycardia   . Diabetes mellitus without complication (HCC)     type II  . Aortic stenosis   . Diastolic dysfunction     grade 1  . Dementia   . Atrial fibrillation (HCC)   . 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 Cateract    . Retna    . Cardiac catheterization N/A 12/22/2014    Procedure: Right/Left Heart Cath and Coronary Angiography;  Surgeon: Lennette Bihari, MD;  Location: [facility] INVASIVE CV LAB;  Service: Cardiovascular;  Laterality: N/A;  . Esophagogastroduodenoscopy N/A 04/24/2015    Procedure: ESOPHAGOGASTRODUODENOSCOPY (EGD);  Surgeon: West Bali, MD;  Location: [facility] ENDO SUITE;  Service: Endoscopy;  Laterality: N/A;  . Esophageal dilation N/A 04/24/2015    Procedure: ESOPHAGEAL DILATION;  Surgeon: West Bali, MD;  Location: [facility] ENDO SUITE;  Service: Endoscopy;  Laterality: N/A;  . Flexible sigmoidoscopy N/A 04/24/2015    Procedure: FLEXIBLE SIGMOIDOSCOPY;  Surgeon: West Bali, MD;  Location: [facility] ENDO SUITE;  Service: Endoscopy;  Laterality: N/A;    Medications:  HOME MEDS: Prior to Admission medications   Medication Sig Start Date End Date Taking? Authorizing Provider  amLODipine (NORVASC) 10 MG tablet Take 10 mg by mouth daily.   Yes Historical Provider, MD  carvedilol (COREG) 12.5 MG tablet Take 0.5 tablets (6.25 mg total) by mouth 2 (two) times daily with a meal. 1/2 tablet 02/01/14  Yes Lennette Bihari, MD  donepezil (ARICEPT) 5 MG tablet Take 5 mg by mouth daily. 11/23/14  Yes Historical Provider, MD  fenofibrate micronized (LOFIBRA) 134 MG capsule TAKE 1 BY MOUTH DAILY 03/22/14  Yes Lennette Bihari, MD  hydrochlorothiazide (MICROZIDE) 12.5 MG capsule Take 1 capsule (12.5 mg total) by mouth daily. 12/16/14  Yes Lennette Bihari, MD  hydrocortisone (ANUSOL-HC) 2.5 % rectal cream Place 1 application rectally 3 (three) times daily. 04/25/15  Yes Micael Hampshire  Loreta Ave, MD  insulin NPH-regular Human (NOVOLIN 70/30) (70-30) 100 UNIT/ML injection Take 12 units 2 times daily. If after 3 days, his blood sugar is above 200 consistently, increase the units by 2 two times daily for 5 days; if his blood sugar is still above 200, increase the units by 2 again and  continue for 5 more days. Continue this regimen until his blood sugars are ranging between 120 and 160. 02/18/15  Yes Elliot Cousin, MD  memantine (NAMENDA) 5 MG tablet Take 1 tablet by mouth daily. 01/04/15  Yes Historical Provider, MD  pantoprazole (PROTONIX) 40 MG tablet Take 1 tablet (40 mg total) by mouth 2 (two) times daily before a meal. 04/25/15  Yes Estela Isaiah Blakes, MD  tamsulosin (FLOMAX) 0.4 MG CAPS capsule Take 1 capsule by mouth daily. 12/28/14  Yes Historical Provider, MD  HYDROcodone-acetaminophen (NORCO) 10-325 MG tablet Take 1 tablet by mouth every 6 (six) hours as needed for moderate pain or severe pain.  01/04/15   Historical Provider, MD    Social History:   reports that he quit smoking about 32 years ago. His smoking use included Pipe. He has never used smokeless tobacco. He reports that he does not drink alcohol or use illicit drugs.  Family History: Family History  Problem Relation Age of Onset  . Heart disease Mother   . Dementia Neg Hx      Physical Exam: Filed Vitals:   05/16/15 2000 05/17/15 1500  BP:  132/40  Pulse:  72  Temp:  97.4 F (36.3 C)  TempSrc:  Oral  Height:  (1.778 m)   Weight: 48.5 kg (106 lb 14.8 oz)   SpO2:  97%   Blood pressure 132/40, pulse 72, temperature 97.4 F (36.3 C), temperature source Oral, height  (1.778 m), weight 48.5 kg (106 lb 14.8 oz), SpO2 97 %.    General: He is resting comfortably, in no acute distress. Chronically ill appearing.   Cardiovascular: 3-6 systolic ejection murmur  Respiratory: Normal respiratory effort lungs are clear  Abdomen: Soft nontender nondistended Musculoskeletal: Sarcopenic, Labs on Admission:  Basic Metabolic Panel:  Recent Labs Lab 05/12/15 1900 05/13/15 0625 05/14/15 0542  NA 139 140 137  K 4.1 4.2 4.2  CL 98* 105 101  CO2 GLUCOSE 136* 194* 308*  BUN 41* 31* 20  CREATININE 2.19* 1.30* 1.09  CALCIUM 10.0 8.7* 8.6*   Liver Function  Tests:  Recent Labs Lab 05/12/15 1900 05/13/15 0625  AST 22 21  ALT 16* 14*  ALKPHOS 34* 28*  BILITOT 1.1 0.7  PROT 7.4 6.2*  ALBUMIN 4.0 3.2*    Recent Labs Lab 05/12/15 1900  AMMONIA 19   CBC:  Recent Labs Lab 05/12/15 1900 05/13/15 0625 05/14/15 0542  WBC 12.6* 9.7 7.0  NEUTROABS 8.8*  --   --   HGB 12.7* 11.0* 11.4*  HCT 38.1* 33.3* 33.9*  MCV 90.9 91.7 91.6  PLT 348 292 314   Cardiac Enzymes:  Recent Labs Lab 05/12/15 1900  CKTOTAL 114    CBG:  Recent Labs Lab 05/15/15 1143 05/15/15 1555 05/15/15 2128 05/16/15 0811 05/17/15 1403  GLUCAP 191* 199* 293* 281* 308*     Radiological Exams on Admission: No results found. Assessment/Plan  Acute encephalopathy -Mr. Radel is a pleasant 82 year old gentleman with a history of advanced dementia having repeated hospitalizations, admitted overnight for mental status changes. Symptoms likely secondary to urinary tract infection and dehydration from minimal by mouth intake.  2. Acute kidney injury  3. Urinary tract infection. -Urinalysis showed the presence of many bacteria with nitrates.  4. Goals of care -After having multiple family meetings, the goal is to pursue comfort care in the inpatient hospice setting. Initially family members wanted to take him home with home hospice services, however, his daughter reported feeling overwhelmed with his care and the burden of being the sole caregiver was beginning to take its toll on her. She stated feeling guilty about transitioning to skilled nursing facility with hospice following. -After multiple meetings family members have agreed to pursue inpatient hospice placement. You're interested in hospice of [facility]. -He was made GIP until bed becomes available.  -I evaluated patient on 05/17/2015 and spoke with his daughter who was present at bedside. They are hoping for transfer to inpatient hospice today. Will touch base with SW. Otherwise he appears  to be resting comfortably.   Other plans as per orders.  Code Status: DNR DVT Prophylaxis: SCD's Family Communication:  Spoke with daughter at bedside Disposition Plan: Admit to GIP    Mahala Menghini, MD [facility] Pager [phone] 7pm to 7am.

## 2015-05-17 NOTE — Progress Notes (Addendum)
[facility] PROGRESS NOTE  Juan Pham XLK:[id number] DOB: 08/27/1930 DOA: 05/16/2015 PCP: Cassell Smiles., MD  Assessment/Plan: 1. Acute encephalopathy -Juan Pham is a pleasant 82 year old gentleman with a history of advanced dementia having repeated hospitalizations, admitted overnight for mental status changes. Symptoms likely secondary to urinary tract infection and dehydration from minimal by mouth intake.  2.  Acute kidney injury  3.  Urinary tract infection. -Urinalysis showed the presence of many bacteria with nitrates.  4.  Goals of care -After having multiple family meetings, the goal is to pursue comfort care in the inpatient hospice setting. Initially family members wanted to take him home with home hospice services, however, his daughter reported feeling overwhelmed with his care and the burden of being the sole caregiver was beginning to take its toll on her. She stated feeling guilty about transitioning to skilled nursing facility with hospice following. -After multiple meetings family members have agreed to pursue inpatient hospice placement. You're interested in hospice of [facility]. -He was made GIP until bed becomes available.  -I evaluated patient on 05/17/2015 and spoke with his daughter who was present at bedside. They are hoping for transfer to inpatient hospice today. Will touch base with SW. Otherwise he appears to be resting comfortably.   Code Status: DNR Family Communication: I spoke with his daughter at bedside Disposition Plan: Plan for inpatient hospice facility placement   Consultants:  Palliative Care  HPI/Subjective: Juan Pham is an 82 year old gentleman with a past medical history of advanced dementia, diabetes mellitus, hypertension, having multiple previous hospitalizations for mental status changes who currently resides at home with his daughter. Admitted to medicine service on 05/12/2015 when he presented with acute  encephalopathy/lethargy. Laboratory data revealed acute kidney injury with creatinine of 2.19. Urinalysis showed urinary tract infection. He was started on gentle IV fluid resuscitation along with empiric IV antibiotic therapy. Spoke with his daughter at bedside who is his primary caregiver. He voiced concerns for repeated hospitalizations and expressed interest and focusing his care on comfort. Palliative care was consulted.  Objective: There were no vitals filed for this visit. No intake or output data in the 24 hours ending 05/17/15 0807 Filed Weights   05/16/15 2000  Weight: 48.5 kg (106 lb 14.8 oz)    Exam:   General:  He is resting comfortably, in no acute distress. Chronically ill appearing.   Cardiovascular: 3-6 systolic ejection murmur  Respiratory: Normal respiratory effort lungs are clear  Abdomen: Soft nontender nondistended  Musculoskeletal: Sarcopenic,   Data Reviewed: Basic Metabolic Panel:  Recent Labs Lab 05/12/15 1900 05/13/15 0625 05/14/15 0542  NA 139 140 137  K 4.1 4.2 4.2  CL 98* 105 101  CO2 GLUCOSE 136* 194* 308*  BUN 41* 31* 20  CREATININE 2.19* 1.30* 1.09  CALCIUM 10.0 8.7* 8.6*   Liver Function Tests:  Recent Labs Lab 05/12/15 1900 05/13/15 0625  AST 22 21  ALT 16* 14*  ALKPHOS 34* 28*  BILITOT 1.1 0.7  PROT 7.4 6.2*  ALBUMIN 4.0 3.2*   No results for input(s): LIPASE, AMYLASE in the last 168 hours.  Recent Labs Lab 05/12/15 1900  AMMONIA 19   CBC:  Recent Labs Lab 05/12/15 1900 05/13/15 0625 05/14/15 0542  WBC 12.6* 9.7 7.0  NEUTROABS 8.8*  --   --   HGB 12.7* 11.0* 11.4*  HCT 38.1* 33.3* 33.9*  MCV 90.9 91.7 91.6  PLT 348 292 314   Cardiac Enzymes:  Recent Labs Lab  05/12/15 1900  CKTOTAL 114   BNP (last 3 results)  Recent Labs  04/22/15 1700  BNP 79.0    ProBNP (last 3 results) No results for input(s): PROBNP in the last 8760 hours.  CBG:  Recent Labs Lab 05/15/15 0914 05/15/15 1143  05/15/15 1555 05/15/15 2128 05/16/15 0811  GLUCAP 209* 191* 199* 293* 281*    Recent Results (from the past 240 hour(s))  Urine culture     Status: None   Collection Time: 05/12/15  7:00 PM  Result Value Ref Range Status   Specimen Description URINE, CATHETERIZED  Final   Special Requests NONE  Final   Culture   Final    NO GROWTH 1 DAY Performed at [facility]    Report Status 05/14/2015 FINAL  Final  Blood Culture (routine x 2)     Status: None (Preliminary result)   Collection Time: 05/12/15  8:02 PM  Result Value Ref Range Status   Specimen Description BLOOD LEFT FOREARM  Final   Special Requests BOTTLES DRAWN AEROBIC ONLY 6CC  Final   Culture NO GROWTH 4 DAYS  Final   Report Status PENDING  Incomplete  Blood Culture (routine x 2)     Status: None (Preliminary result)   Collection Time: 05/12/15  8:12 PM  Result Value Ref Range Status   Specimen Description BLOOD LEFT HAND  Final   Special Requests BOTTLES DRAWN AEROBIC ONLY 6CC  Final   Culture NO GROWTH 4 DAYS  Final   Report Status PENDING  Incomplete     Studies: No results found.  Scheduled Meds: . amLODipine  10 mg Oral Daily  . insulin NPH Human  12 Units Subcutaneous BID AC & HS   Continuous Infusions:    Active Problems:   End of life care    Time spent: 15 min    Jeralyn Bennett  [facility] Pager [phone]. If 7PM-7AM, please contact night-coverage at [email or web address], password [facility] 05/17/2015, 8:07 AM  LOS: 1 day

## 2015-05-17 NOTE — Discharge Summary (Signed)
Physician Discharge Summary  DARRALL STREY WUJ:[id number] DOB: December 23, 1930 DOA: 05/16/2015  PCP: Cassell Smiles., MD  Admit date: 05/16/2015 Discharge date: 05/17/2015  Time spent: 35 minutes  Recommendations for Outpatient Follow-up:  1. Juan Pham discharged to inpatient hospice, focus of his care on comfort   Discharge Diagnoses:  Active Problems:   Acute kidney injury (HCC)   Dehydration   FTT (failure to thrive) in adult   UTI (lower urinary tract infection)   Dementia with behavioral disturbance   End of life care   Discharge Condition: Stable for transport  Diet recommendation: As tolerated  Filed Weights   05/16/15 2000  Weight: 48.5 kg (106 lb 14.8 oz)    History of present illness:  Juan Pham is an 82 y.o. male With a hx of HTN, HLD, DM type 2, dementia, presents with complaints of AMS. I had the opportunity to care for him on previous admission for hematuria. During that time, his family did not want any aggressive measures done upon him, and consideration for hospice or palliative care was strongly considered. Today, workup showed WBC 12.6 and elevated creatinine 2.19. UA was positive for a UTI. Patient was lethargic and was not able to converse at all today. Hospitalist was asked to admit him for UTI, AMS, and AKI with Cr of 2.19. While in ED, patient had reported an increase in abnormal and frequent falling but denied hitting his head. He was able to speak earlier, but has since become less responsive and was having abnormal shaking. Patient has had no hx of Sz in the past.   Hospital Course:  Mr Juan Pham is an 82 year old gentleman with a past medical history of end-stage dementia, diabetes mellitus, hypertension, having multiple previous hospitalizations for mental status changes who currently resides at home with his daughter. Admitted to medicine service on 05/12/2015 when he presented with acute encephalopathy/lethargy. Laboratory data revealed acute kidney  injury with creatinine of 2.19. Urinalysis showed urinary tract infection. He was started on gentle IV fluid resuscitation along with empiric IV antibiotic therapy. Spoke with his daughter at bedside who is his primary caregiver. He voiced concerns for repeated hospitalizations and expressed interest and focusing his care on comfort. Palliative care was consulted. Family meeting held with palliative care on 05/13/2015. They wish to focus care of comfort while treating what can be treated.  After having multiple family meetings, the goal is to pursue comfort care in the inpatient hospice setting. Initially family members wanted to take him home with home hospice services, however, his daughter reported feeling overwhelmed with his care and the burden of being the sole caregiver was beginning to take its toll on her. She stated feeling guilty about transitioning to skilled nursing facility with hospice following. Family members interested in hospice of [place]. He was made GIP until bed becomes available.   Consultations:  Palliative care  Discharge Exam: There were no vitals filed for this visit.   General: He is resting comfortably, in no acute distress. Chronically ill appearing.   Cardiovascular: 3-6 systolic ejection murmur  Respiratory: Normal respiratory effort lungs are clear  Abdomen: Soft nontender nondistended  Musculoskeletal: Sarcopenic,  Discharge Instructions   Discharge Instructions    Call MD for:  severe uncontrolled pain    Complete by:  As directed      Diet - low sodium heart healthy    Complete by:  As directed      Increase activity slowly    Complete by:  As directed           Current Discharge Medication List    START taking these medications   Details  atropine 1 % ophthalmic solution Place 4 drops under the tongue every 4 (four) hours as needed (excessive secretions). Qty: 2 mL, Refills: 12    haloperidol (HALDOL) 0.5 MG tablet Take 1 tablet  (0.5 mg total) by mouth every 4 (four) hours as needed for agitation (or delirium). Qty: 30 tablet, Refills: 0    haloperidol lactate (HALDOL) 5 MG/ML injection Inject 0.1 mLs (0.5 mg total) into the vein every 4 (four) hours as needed (or delirium). Qty: 1 mL, Refills: 0    LORazepam (ATIVAN) 2 MG/ML injection Inject 0.5 mLs (1 mg total) into the vein every 4 (four) hours as needed for sedation. Qty: 1 mL, Refills: 0    morphine 2 MG/ML injection Inject 1 mL (2 mg total) into the vein every 2 (two) hours as needed. Qty: 1 mL, Refills: 0      CONTINUE these medications which have NOT CHANGED   Details  amLODipine (NORVASC) 10 MG tablet Take 10 mg by mouth daily.    carvedilol (COREG) 12.5 MG tablet Take 0.5 tablets (6.25 mg total) by mouth 2 (two) times daily with a meal. 1/2 tablet Qty: 90 tablet, Refills: 3    hydrocortisone (ANUSOL-HC) 2.5 % rectal cream Place 1 application rectally 3 (three) times daily. Qty: 30 g, Refills: 0    insulin NPH-regular Human (NOVOLIN 70/30) (70-30) 100 UNIT/ML injection Take 12 units 2 times daily. If after 3 days, his blood sugar is above 200 consistently, increase the units by 2 two times daily for 5 days; if his blood sugar is still above 200, increase the units by 2 again and continue for 5 more days. Continue this regimen until his blood sugars are ranging between 120 and 160. Qty: 10 mL, Refills: 11    tamsulosin (FLOMAX) 0.4 MG CAPS capsule Take 1 capsule by mouth daily.      STOP taking these medications     HYDROcodone-acetaminophen (NORCO) 10-325 MG tablet      LORazepam (ATIVAN) 0.5 MG tablet      morphine 20 MG/5ML solution        No Known Allergies    The results of significant diagnostics from this hospitalization (including imaging, microbiology, ancillary and laboratory) are listed below for reference.    Significant Diagnostic Studies: Dg Chest 1 View  05/12/2015  CLINICAL DATA:  82 year old with Alzheimer's dementia  found unresponsive by his family earlier today. EMS found patient with pinpoint pupils and respirations at 6 per minute. Patient responded to an intravenous dose of Narcan. EXAM: CHEST 1 VIEW COMPARISON:  04/28/2015 and earlier. FINDINGS: AP erect image was obtained. Suboptimal inspiration accounts for crowded bronchovascular markings, especially in the bases, and accentuates the cardiac silhouette. Taking this into account, cardiac silhouette upper normal in size. Lungs clear. Bronchovascular markings normal. Pulmonary vascularity normal. No visible pleural effusions. No pneumothorax. IMPRESSION: Suboptimal inspiration.  No acute cardiopulmonary disease. Electronically Signed   By: Hulan Saas M.D.   On: 05/12/2015 19:41   Dg Chest 2 View  04/28/2015  CLINICAL DATA:  Gross hematuria, generalized abdominal pain. EXAM: CHEST  2 VIEW COMPARISON:  April 12, 2015. FINDINGS: The heart size and mediastinal contours are within normal limits. No pneumothorax or pleural effusion is noted. Minimal bibasilar subsegmental atelectasis is noted secondary to hypoinflation of the lungs. The visualized skeletal structures are unremarkable.  IMPRESSION: Minimal bibasilar subsegmental atelectasis secondary to hypoinflation of the lungs. Electronically Signed   By: Lupita Raider, M.D.   On: 04/28/2015 14:31   Ct Head Wo Contrast  05/12/2015  CLINICAL DATA:  Patient found unresponsive tonight. Initial encounter. EXAM: CT HEAD WITHOUT CONTRAST TECHNIQUE: Contiguous axial images were obtained from the base of the skull through the vertex without intravenous contrast. COMPARISON:  Head CT scan 04/26/2015. FINDINGS: There is atrophy and chronic microvascular ischemic change. No evidence of acute intracranial abnormality including hemorrhage, infarct, mass lesion, mass effect, midline shift or abnormal extra-axial fluid collection is seen. No hydrocephalus or pneumocephalus. The calvarium is intact. Imaged paranasal sinuses and  mastoid air cells are clear. IMPRESSION: No acute abnormality. Atrophy and chronic microvascular ischemic change. Electronically Signed   By: Drusilla Kanner M.D.   On: 05/12/2015 19:39   Ct Head Wo Contrast  04/26/2015  CLINICAL DATA:  Vomiting for 3 days with severe headaches after vomiting. Recent esophageal dilatation and hemorrhoid resection yesterday. EXAM: CT HEAD WITHOUT CONTRAST TECHNIQUE: Contiguous axial images were obtained from the base of the skull through the vertex without intravenous contrast. COMPARISON:  April 12, 2015 FINDINGS: Paranasal sinuses, mastoid air cells, and bones are within normal limits. The extracranial soft tissues are unremarkable. No subdural, epidural, or subarachnoid hemorrhage. No mass, mass effect, or midline shift. Ventricles and sulci are prominent but stable. Cerebellum, brainstem, and basal cisterns are unchanged as well. No acute cortical ischemia or infarct. IMPRESSION: No acute intracranial process identified to explain the patient's headaches. Electronically Signed   By: Gerome Sam III M.D   On: 04/26/2015 11:49   Ct Abdomen Pelvis W Contrast  04/26/2015  CLINICAL DATA:  Right-sided abdominal pain and vomiting EXAM: CT ABDOMEN AND PELVIS WITH CONTRAST TECHNIQUE: Multidetector CT imaging of the abdomen and pelvis was performed using the standard protocol following bolus administration of intravenous contrast. CONTRAST:  OMNIPAQUE IOHEXOL 300 MG/ML SOLN, 25mL OMNIPAQUE IOHEXOL 300 MG/ML SOLN COMPARISON:  07/18/2009 FINDINGS: Lower chest and abdominal wall: Bulky aortic valve calcification with thickened appearance of left ventricular walls. Left inguinal hernia which chronically contains nonobstructed or inflamed sigmoid colon. Hepatobiliary: No focal liver abnormality.No evidence of biliary obstruction or stone. Pancreas: Atrophy without inflammation or duct enlargement. Spleen: Unremarkable. Adrenals/Urinary Tract: Mildly lobulated adrenal glands  without measurable nodule. Punctate stone in the lower pole left kidney. No hydronephrosis or ureteral calculus. Bladder is moderately full with chronic wall thickening. Reproductive:Chronic marked enlargement of the prostate gland deforming the bladder base. Stomach/Bowel:  No obstruction. No appendicitis. Vascular/Lymphatic: Extensive atheromatous wall thickening and calcification of the aorta and branch vessels with celiac and SMA origin stenosis. There is also prominent plaque at the celiac bifurcation. No mass or adenopathy. Peritoneal: No ascites or pneumoperitoneum. Musculoskeletal: No acute abnormalities. IMPRESSION: 1. No acute finding. 2. Chronic left inguinal hernia containing nonobstructed sigmoid colon. 3. Bulky aortic valve calcification. If no previous echocardiography suggest outpatient follow-up to evaluate for stenosis. 4. Marked prostatomegaly with chronic outlet obstruction. The bladder is moderately distended currently. Electronically Signed   By: Marnee Spring M.D.   On: 04/26/2015 11:47    Microbiology: Recent Results (from the past 240 hour(s))  Urine culture     Status: None   Collection Time: 05/12/15  7:00 PM  Result Value Ref Range Status   Specimen Description URINE, CATHETERIZED  Final   Special Requests NONE  Final   Culture   Final    NO GROWTH 1 DAY  Performed at [facility]    Report Status 05/14/2015 FINAL  Final  Blood Culture (routine x 2)     Status: None (Preliminary result)   Collection Time: 05/12/15  8:02 PM  Result Value Ref Range Status   Specimen Description BLOOD LEFT FOREARM  Final   Special Requests BOTTLES DRAWN AEROBIC ONLY 6CC  Final   Culture NO GROWTH 4 DAYS  Final   Report Status PENDING  Incomplete  Blood Culture (routine x 2)     Status: None (Preliminary result)   Collection Time: 05/12/15  8:12 PM  Result Value Ref Range Status   Specimen Description BLOOD LEFT HAND  Final   Special Requests BOTTLES DRAWN AEROBIC ONLY 6CC   Final   Culture NO GROWTH 4 DAYS  Final   Report Status PENDING  Incomplete     Labs: Basic Metabolic Panel:  Recent Labs Lab 05/12/15 1900 05/13/15 0625 05/14/15 0542  NA 139 140 137  K 4.1 4.2 4.2  CL 98* 105 101  CO2 30 26 27   GLUCOSE 136* 194* 308*  BUN 41* 31* 20  CREATININE 2.19* 1.30* 1.09  CALCIUM 10.0 8.7* 8.6*   Liver Function Tests:  Recent Labs Lab 05/12/15 1900 05/13/15 0625  AST 22 21  ALT 16* 14*  ALKPHOS 34* 28*  BILITOT 1.1 0.7  PROT 7.4 6.2*  ALBUMIN 4.0 3.2*   No results for input(s): LIPASE, AMYLASE in the last 168 hours.  Recent Labs Lab 05/12/15 1900  AMMONIA 19   CBC:  Recent Labs Lab 05/12/15 1900 05/13/15 0625 05/14/15 0542  WBC 12.6* 9.7 7.0  NEUTROABS 8.8*  --   --   HGB 12.7* 11.0* 11.4*  HCT 38.1* 33.3* 33.9*  MCV 90.9 91.7 91.6  PLT 348 292 314   Cardiac Enzymes:  Recent Labs Lab 05/12/15 1900  CKTOTAL 114   BNP: BNP (last 3 results)  Recent Labs  04/22/15 1700  BNP 79.0    ProBNP (last 3 results) No results for input(s): PROBNP in the last 8760 hours.  CBG:  Recent Labs Lab 05/15/15 0914 05/15/15 1143 05/15/15 1555 05/15/15 2128 05/16/15 0811  GLUCAP 209* 191* 199* 293* 281*       Signed:  Jeralyn Bennett MD.  [facility] 05/17/2015, 8:24 AM

## 2015-05-18 DIAGNOSIS — N390 Urinary tract infection, site not specified: Secondary | ICD-10-CM

## 2015-05-18 NOTE — Progress Notes (Signed)
Hospice nurse, Heidi Dach, to room to evalulate patient, had requested via phone prior to arriving not to give pain medication prior to their evaluation.  After evaluation, Heidi Dach informed me patient does not meet criteria for Hospice Home.

## 2015-05-18 NOTE — Progress Notes (Signed)
Daughter in room, requested pain medication for patient, found patient resting calmly in bed, daughter reported patient complaining to her of back pain, medication given.  Will continue to monitor patient for pain.  Daughter came to desk stating patient needed something stronger for pain.

## 2015-05-18 NOTE — Progress Notes (Signed)
CSW contacted Arline Asp at [facility]- no beds at this time but they do anticipate something soon- MD and family updated.   Reece Levy, MSW, Theresia Majors  [phone]

## 2015-05-18 NOTE — Progress Notes (Signed)
[facility] PROGRESS NOTE  Juan Pham ZOX:[id number] DOB: 1931/03/16 DOA: 05/16/2015 PCP: Cassell Smiles., MD  Assessment/Plan: 1. Acute encephalopathy -Juan Pham is a pleasant 82 year old gentleman with a history of advanced dementia having repeated hospitalizations, admitted overnight for mental status changes. Symptoms likely secondary to urinary tract infection and AKI due to dehydration from minimal by mouth intake. -mentation still fluctuating according to family members -poor intake overall  -will monitor and treat symptomatically  2.  Acute kidney injury -due to dehydration and UTI -improved with IVF's and abx's -now following decision from [facility] meeting will not check further labs  3.  Urinary tract infection. -Urinalysis showed the presence of many bacteria with nitrites  -treated initially with empiric antibiotics and subsequently discontinued following GOC meeting -patient is afebrile and last WBC's WNL  4.  Goals of care -After having multiple family meetings, the goal is to pursue comfort care. Patient was evaluated by Hospice representative and found patient not to be elegible for inpatient hospice at this time. -will pursuit SNF with hospice follow up -continue symptomatic management and comfort feeding.  5. Advance dementia -continue supportive care -no behavioral components appreciated currently  -PRN haldol available  6. HTN and Diabetes -medications for this conditions discontinued as plan is to focus on comfort care and symptomatic management -will minimize taking meds and also CBG's checks/injections  -overall stable and no causing any discomfort   Code Status: DNR Family Communication: I spoke with his daughter at bedside Disposition Plan: Plan for hospice facility vs SNF with hospice    Consultants:  Palliative Care  HPI/Subjective: Afebrile, in no distress, hemodynamically stable and overall comfortable.   Objective: Filed Vitals:   05/17/15 1500 05/18/15 0928  BP: 132/40 144/55  Pulse: 72 62  Temp: 97.4 F (36.3 C)     Intake/Output Summary (Last 24 hours) at 05/18/15 1855 Last data filed at 05/18/15 1800  Gross per 24 hour  Intake    480 ml  Output   1650 ml  Net  -1170 ml   Filed Weights   05/16/15 2000  Weight: 48.5 kg (106 lb 14.8 oz)    Exam:   General:  He is resting comfortably, in no acute distress. Chronically ill appearing and oriented only to person.   Cardiovascular: 3-6 systolic ejection murmur, no rubs, no gallops  Respiratory: Normal respiratory effort, lungs are clear  Abdomen: Soft, positive BS, non-tender and  nondistended  Musculoskeletal: mild to moderate muscular atrophy appreciated, no cyanosis, no swelling   Data Reviewed: Basic Metabolic Panel:  Recent Labs Lab 05/12/15 1900 05/13/15 0625 05/14/15 0542  NA 139 140 137  K 4.1 4.2 4.2  CL 98* 105 101  CO2 GLUCOSE 136* 194* 308*  BUN 41* 31* 20  CREATININE 2.19* 1.30* 1.09  CALCIUM 10.0 8.7* 8.6*   Liver Function Tests:  Recent Labs Lab 05/12/15 1900 05/13/15 0625  AST 22 21  ALT 16* 14*  ALKPHOS 34* 28*  BILITOT 1.1 0.7  PROT 7.4 6.2*  ALBUMIN 4.0 3.2*    Recent Labs Lab 05/12/15 1900  AMMONIA 19   CBC:  Recent Labs Lab 05/12/15 1900 05/13/15 0625 05/14/15 0542  WBC 12.6* 9.7 7.0  NEUTROABS 8.8*  --   --   HGB 12.7* 11.0* 11.4*  HCT 38.1* 33.3* 33.9*  MCV 90.9 91.7 91.6  PLT 348 292 314   Cardiac Enzymes:  Recent Labs Lab 05/12/15 1900  CKTOTAL 114   BNP (last 3 results)  Recent Labs  04/22/15 1700  BNP 79.0   CBG:  Recent Labs Lab 05/15/15 1143 05/15/15 1555 05/15/15 2128 05/16/15 0811 05/17/15 1403  GLUCAP 191* 199* 293* 281* 308*    Recent Results (from the past 240 hour(s))  Urine culture     Status: None   Collection Time: 05/12/15  7:00 PM  Result Value Ref Range Status   Specimen Description URINE, CATHETERIZED  Final   Special Requests NONE   Final   Culture   Final    NO GROWTH 1 DAY Performed at [facility]    Report Status 05/14/2015 FINAL  Final  Blood Culture (routine x 2)     Status: None   Collection Time: 05/12/15  8:02 PM  Result Value Ref Range Status   Specimen Description BLOOD LEFT FOREARM  Final   Special Requests BOTTLES DRAWN AEROBIC ONLY 6CC  Final   Culture NO GROWTH 5 DAYS  Final   Report Status 05/17/2015 FINAL  Final  Blood Culture (routine x 2)     Status: None   Collection Time: 05/12/15  8:12 PM  Result Value Ref Range Status   Specimen Description BLOOD LEFT HAND  Final   Special Requests BOTTLES DRAWN AEROBIC ONLY 6CC  Final   Culture NO GROWTH 5 DAYS  Final   Report Status 05/17/2015 FINAL  Final     Studies: No results found.   Active Problems:   Acute kidney injury (HCC)   Dehydration   FTT (failure to thrive) in adult   UTI (lower urinary tract infection)   Dementia with behavioral disturbance   End of life care    Time spent: 15 min    Vassie Loll MD  [facility] Pager [phone]. If 7PM-7AM, please contact night-coverage at [email or web address], password [facility] 05/18/2015, 6:55 PM  LOS: 2 days

## 2015-05-19 ENCOUNTER — Inpatient Hospital Stay
Admission: RE | Admit: 2015-05-19 | Discharge: 2015-05-23 | Disposition: A | Discharge status: Hospice | Payer: PPO | Source: Ambulatory Visit | Attending: Internal Medicine | Admitting: Internal Medicine

## 2015-05-19 DIAGNOSIS — N400 Enlarged prostate without lower urinary tract symptoms: Secondary | ICD-10-CM | POA: Insufficient documentation

## 2015-05-19 MED ORDER — MORPHINE SULFATE 20 MG/5ML PO SOLN: 5.0000 mg | ORAL | Status: AC | PRN

## 2015-05-19 MED ORDER — LORAZEPAM 0.5 MG PO TABS: 0.5000 mg | ORAL_TABLET | Freq: Four times a day (QID) | ORAL | Status: AC | PRN

## 2015-05-19 NOTE — Plan of Care (Signed)
Problem: Acute Rehab PT Goals(only PT should resolve) Goal: Pt Will Go Supine/Side To Sit Pt will demonstrate MinA bed mobility supine to sitting edge-of-bed to return to PLOF and to decrease caregiver burden.     Goal: Patient Will Transfer Sit To/From Stand Pt will transfer sit to/from-stand with RW at [facility] without loss-of-balance to demonstrate good safety awareness for independent mobility in home.     Goal: Pt Will Ambulate Pt will ambulate with RW at Supervision using a step-through pattern and equal step length for a distance greater than 241ft to demonstrate the ability to perform safe household distance ambulation at discharge.

## 2015-05-19 NOTE — Clinical Social Work Placement (Signed)
   CLINICAL SOCIAL WORK PLACEMENT  NOTE  Date:  05/19/2015  Patient Details  Name: Juan Pham MRN: [id number] Date of Birth: Aug 21, 1930  Clinical Social Work is seeking post-discharge placement for this patient at the Skilled  Nursing Facility level of care (*CSW will initial, date and re-position this form in  chart as items are completed):  Yes   Patient/family provided with [facility] Clinical Social Work Department's list of facilities offering this level of care within the geographic area requested by the patient (or if unable, by the patient's family).  Yes   Patient/family informed of their freedom to choose among providers that offer the needed level of care, that participate in Medicare, Medicaid or managed care program needed by the patient, have an available bed and are willing to accept the patient.  Yes   Patient/family informed of [facility]'s ownership interest in [facility] and [facility], as well as of the fact that they are under no obligation to receive care at these facilities.  PASRR submitted to EDS on 05/19/15     PASRR number received on 05/19/15     Existing PASRR number confirmed on       FL2 transmitted to all facilities in geographic area requested by pt/family on 05/19/15     FL2 transmitted to all facilities within larger geographic area on       Patient informed that his/her managed care company has contracts with or will negotiate with certain facilities, including the following:        Yes   Patient/family informed of bed offers received.  Patient chooses bed at [facility]     Physician recommends and patient chooses bed at      Patient to be transferred to [facility] on 05/19/15.  Patient to be transferred to facility by [facility] hospital staff      Patient family notified on 05/19/15 of transfer.  Name of family member notified:  CSW notified all three daughters who were at bedside.      PHYSICIAN        Additional Comment:    _______________________________________________ Annice Needy, LCSW 05/19/2015, 3:50 PM

## 2015-05-19 NOTE — Evaluation (Signed)
Physical Therapy Evaluation Patient Details Name: Juan Pham MRN: [id number] DOB: [birth year] Today's Date: 05/19/2015   History of Present Illness  82yo white M c multiple admissions over the last 45 days returns to [facility] after worsening AMS/weakness. Pt found to have UTI. Pt was here 4 weeks ago for AMS, 2 weeks ago for rectal bleeding, and at home c N/V on 1/17. PMH: DM, HTN, DM, and aortic stenosis. At baseline pt is oriented to person only.  Pt is familiar to this PT from prior admissions,   Clinical Impression  Pt is received semirecumbent in bed upon entry, awake, alert, and willing to participate. No acute distress noted. Pt is presenting with flat affect and continuous mumbling that is difficult to understand at times, but remains conversational when engaged. Pt requires MaxA for all bed mobility for heavy physical assistance, but is motivated and participates well. Several attempts are made to transfer, but patient is limited by profound weakness in trunk and hip extensors which translate into some fears anxiety as well. At baseline a month ago, pt was able to perform all functional mobility with supervision, requiring no physical assistance, hence he is very deconditioned, likely since he has had little time to regain strength between frequent admissions. Pt demonstrating impaired strength, gross coordination, balance, and postural strength, limiting ability to perform ADL and limited community mobility at baseline level, and will benefit from skilled PT intervention to restore to baseline level of functioning.         Follow Up Recommendations SNF;Supervision/Assistance - 24 hour;Supervision for mobility/OOB    Equipment Recommendations  None recommended by PT    Recommendations for Other Services       Precautions / Restrictions Precautions Precautions: Fall Restrictions Weight Bearing Restrictions: No      Mobility  Bed Mobility Overal bed mobility: Needs Assistance Bed  Mobility: Supine to Sit;Sit to Supine     Supine to sit: Max assist;+2 for physical assistance Sit to supine: Max assist;+2 for physical assistance      Transfers Overall transfer level: Needs assistance     Sit to Stand: Total assist;+2 physical assistance;From elevated surface         General transfer comment: Attempted multiple times c and s AD, dependent, assisted, and elevated surface. Pt is motivated and attempts to perform, but is limited by impaired gross coordination, hip extension weakness, and trunk weakness.   Ambulation/Gait                Stairs            Wheelchair Mobility    Modified Rankin (Stroke Patients Only)       Balance Overall balance assessment: Needs assistance Sitting-balance support: Bilateral upper extremity supported;Feet supported Sitting balance-Leahy Scale: Poor Sitting balance - Comments: unassisted for short periods, but very fearful of falling forward off bed, with habitual posterior trunk lean intermittently.                                      Pertinent Vitals/Pain Pain Assessment: No/denies pain    Home Living Family/patient expects to be discharged to:: Skilled nursing facility Living Arrangements: Children Available Help at Discharge: Family Type of Home: House Home Access: Stairs to enter Entrance Stairs-Rails: Can reach both;Left;Right Entrance Stairs-Number of Steps: 2 Home Layout: One level Home Equipment: Walker - 2 wheels;Bedside commode Additional Comments: Pt is very confused at evaluation, near  cognitive baseline, answers questions appropriately 50% of time, and follows simple commands well.     Prior Function Level of Independence: Independent with assistive device(s)         Comments: 1 month ago, slow, steady community distance AMB with RW and supervision. Indep in all ADL per daughtr; does not drive or prepare meals.      Hand Dominance   Dominant Hand: Right     Extremity/Trunk Assessment   Upper Extremity Assessment: Generalized weakness           Lower Extremity Assessment: Generalized weakness (very strong, but lacks coordination and strength to come to standing. )      Cervical / Trunk Assessment: Kyphotic  Communication   Communication: Expressive difficulties  Cognition Arousal/Alertness: Awake/alert Behavior During Therapy: Anxious;Flat affect Overall Cognitive Status: History of cognitive impairments - at baseline Area of Impairment: Orientation Orientation Level: Person   Memory: Decreased recall of precautions;Decreased short-term memory              General Comments      Exercises        Assessment/Plan    PT Assessment Patient needs continued PT services  PT Diagnosis Generalized weakness;Difficulty walking;Altered mental status   PT Problem List Decreased strength;Decreased knowledge of use of DME;Decreased range of motion;Decreased safety awareness;Decreased knowledge of precautions;Decreased balance;Decreased cognition;Decreased mobility;Decreased coordination  PT Treatment Interventions Gait training;Stair training;Functional mobility training;Therapeutic activities;Therapeutic exercise;Balance training   PT Goals (Current goals can be found in the Care Plan section) Acute Rehab PT Goals Patient Stated Goal: Regain strength and indep in mobility.  PT Goal Formulation: With family Time For Goal Achievement: 06/02/15 Potential to Achieve Goals: Fair    Frequency Min 3X/week   Barriers to discharge Inaccessible home environment      Co-evaluation               End of Session   Activity Tolerance: Patient limited by fatigue Patient left: in bed;with call bell/phone within reach;with bed alarm set           Time: 1355-1407 PT Time Calculation (min) (ACUTE ONLY): 12 min   Charges:   PT Evaluation $PT Eval Moderate Complexity: 1 Procedure     PT G Codes:        2:34 PM,  May 24, 2015 Rosamaria Lints, PT, DPT PRN Physical Therapist at [facility] License # [id number] [phone] (wireless)  [phone] (mobile)

## 2015-05-19 NOTE — Discharge Summary (Signed)
Physician Discharge Summary  Juan Pham QIH:[id number] DOB: 1930/05/15 DOA: 05/16/2015  PCP: Cassell Smiles., MD  Admit date: 05/16/2015 Discharge date: 05/19/2015  Time spent: 30 minutes  Recommendations for Outpatient Follow-up:  1. Comfort feeding 2. Symptomatic management and comfort care   Discharge Diagnoses:  Active Problems:   Acute kidney injury (HCC)   Dehydration   FTT (failure to thrive) in adult   UTI (lower urinary tract infection)   Dementia with behavioral disturbance   End of life care   Discharge Condition: will discharge home with home hospice. No further readmissions, no meds for HTN or diabetes.  Diet recommendation: comfort feeding   Filed Weights   05/16/15 2000  Weight: 48.5 kg (106 lb 14.8 oz)    History of present illness:  82 y.o. male With a hx of HTN, HLD, DM type 2, dementia, presents with complaints of AMS. I had the opportunity to care for him on previous admission for hematuria. During that time, his family did not want any aggressive measures done upon him, and consideration for hospice or palliative care was strongly considered. Today, workup showed WBC 12.6 and elevated creatinine 2.19. UA was positive for a UTI. Patient was lethargic and was not able to converse at all. Hospitalist was asked to admit him for UTI, AMS, and AKI with Cr of 2.19. While in ED, patient had reported an increase in abnormal and frequent falling but denied hitting his head.  Hospital Course:  1. Acute encephalopathy -Juan Pham is a pleasant 82 year old gentleman with a history of advanced dementia having repeated hospitalizations, admitted overnight for mental status changes. Symptoms likely secondary to urinary tract infection and AKI due to dehydration from minimal by mouth intake. -mentation still fluctuating according to family members -poor intake overall  -will monitor and treat symptomatically  2. Acute kidney injury -due to dehydration and  UTI -improved with IVF's and abx's -now following decision from [facility] meeting will not check further labs  3. Urinary tract infection. -Urinalysis showed the presence of many bacteria with nitrites  -treated initially with empiric antibiotics and subsequently discontinued following GOC meeting -patient is afebrile, no complains of dysuria or suprapubic pain and last WBC's WNL  4. Goals of care -After having multiple family meetings, the goal is to pursue comfort care. Patient was evaluated by Hospice representative and found patient not to be elegible for inpatient hospice at this time. -after discussion with family, plan is to go home with hospice -continue symptomatic management and comfort feeding.  5. Advance dementia -continue supportive care -no behavioral components appreciated currently  -PRN haldol will be prescribe  6. HTN and Diabetes -medications for these conditions discontinued as plan is to focus on comfort care and symptomatic management. -will minimize taking meds and also CBG's checks/injections  -overall stable and no causing any discomfort   7. BPH:  -ok to continue flomax, which would prevent urinary retention and will help some with BP  Procedures:  See below for x-ray reports   Consultations:  Palliative Care   Discharge Exam: Filed Vitals:   05/18/15 0928 05/18/15 2054  BP: 144/55 133/58  Pulse: 62 72  Temp:  98.4 F (36.9 C)  Resp:  20   2. General: Juan Pham is resting comfortably, in no acute distress. Chronically ill appearing, cachetic and oriented only to person.  3. Cardiovascular: 3-6 systolic ejection murmur, no rubs, no gallops 4. Respiratory: Normal respiratory effort, lungs are clear 5. Abdomen: Soft, positive BS, non-tender and nondistended 6.  Musculoskeletal: mild to moderate muscular atrophy appreciated, no cyanosis, no swelling  Discharge Instructions   Discharge Instructions    Discharge instructions    Complete by:  As  directed   Comfort feeding Follow instructions and recommendations from Hospice          Current Discharge Medication List    START taking these medications   Details  atropine 1 % ophthalmic solution Place 4 drops under the tongue every 4 (four) hours as needed (excessive secretions). Qty: 2 mL, Refills: 12    haloperidol (HALDOL) 0.5 MG tablet Take 1 tablet (0.5 mg total) by mouth every 4 (four) hours as needed for agitation (or delirium). Qty: 30 tablet, Refills: 0      CONTINUE these medications which have CHANGED   Details  LORazepam (ATIVAN) 0.5 MG tablet Take 1 tablet (0.5 mg total) by mouth every 6 (six) hours as needed for anxiety or sedation. Qty: 30 tablet, Refills: 0    morphine 20 MG/5ML solution Take 1.3 mLs (5.2 mg total) by mouth every 4 (four) hours as needed for pain. Qty: 30 mL, Refills: 0      CONTINUE these medications which have NOT CHANGED   Details  hydrocortisone (ANUSOL-HC) 2.5 % rectal cream Place 1 application rectally 3 (three) times daily. Qty: 30 g, Refills: 0    tamsulosin (FLOMAX) 0.4 MG CAPS capsule Take 1 capsule by mouth daily.      STOP taking these medications     amLODipine (NORVASC) 10 MG tablet      carvedilol (COREG) 12.5 MG tablet      HYDROcodone-acetaminophen (NORCO) 10-325 MG tablet      insulin NPH-regular Human (NOVOLIN 70/30) (70-30) 100 UNIT/ML injection        No Known Allergies   The results of significant diagnostics from this hospitalization (including imaging, microbiology, ancillary and laboratory) are listed below for reference.    Significant Diagnostic Studies: Dg Chest 1 View  05/12/2015  CLINICAL DATA:  82 year old with Alzheimer's dementia found unresponsive by his family earlier today. EMS found patient with pinpoint pupils and respirations at 6 per minute. Patient responded to an intravenous dose of Narcan. EXAM: CHEST 1 VIEW COMPARISON:  04/28/2015 and earlier. FINDINGS: AP erect image was obtained.  Suboptimal inspiration accounts for crowded bronchovascular markings, especially in the bases, and accentuates the cardiac silhouette. Taking this into account, cardiac silhouette upper normal in size. Lungs clear. Bronchovascular markings normal. Pulmonary vascularity normal. No visible pleural effusions. No pneumothorax. IMPRESSION: Suboptimal inspiration.  No acute cardiopulmonary disease. Electronically Signed   By: Hulan Saas M.D.   On: 05/12/2015 19:41   Dg Chest 2 View  04/28/2015  CLINICAL DATA:  Gross hematuria, generalized abdominal pain. EXAM: CHEST  2 VIEW COMPARISON:  April 12, 2015. FINDINGS: The heart size and mediastinal contours are within normal limits. No pneumothorax or pleural effusion is noted. Minimal bibasilar subsegmental atelectasis is noted secondary to hypoinflation of the lungs. The visualized skeletal structures are unremarkable. IMPRESSION: Minimal bibasilar subsegmental atelectasis secondary to hypoinflation of the lungs. Electronically Signed   By: Lupita Raider, M.D.   On: 04/28/2015 14:31   Ct Head Wo Contrast  05/12/2015  CLINICAL DATA:  Patient found unresponsive tonight. Initial encounter. EXAM: CT HEAD WITHOUT CONTRAST TECHNIQUE: Contiguous axial images were obtained from the base of the skull through the vertex without intravenous contrast. COMPARISON:  Head CT scan 04/26/2015. FINDINGS: There is atrophy and chronic microvascular ischemic change. No evidence of acute intracranial  abnormality including hemorrhage, infarct, mass lesion, mass effect, midline shift or abnormal extra-axial fluid collection is seen. No hydrocephalus or pneumocephalus. The calvarium is intact. Imaged paranasal sinuses and mastoid air cells are clear. IMPRESSION: No acute abnormality. Atrophy and chronic microvascular ischemic change. Electronically Signed   By: Drusilla Kanner M.D.   On: 05/12/2015 19:39   Ct Head Wo Contrast  04/26/2015  CLINICAL DATA:  Vomiting for 3 days with  severe headaches after vomiting. Recent esophageal dilatation and hemorrhoid resection yesterday. EXAM: CT HEAD WITHOUT CONTRAST TECHNIQUE: Contiguous axial images were obtained from the base of the skull through the vertex without intravenous contrast. COMPARISON:  April 12, 2015 FINDINGS: Paranasal sinuses, mastoid air cells, and bones are within normal limits. The extracranial soft tissues are unremarkable. No subdural, epidural, or subarachnoid hemorrhage. No mass, mass effect, or midline shift. Ventricles and sulci are prominent but stable. Cerebellum, brainstem, and basal cisterns are unchanged as well. No acute cortical ischemia or infarct. IMPRESSION: No acute intracranial process identified to explain the patient's headaches. Electronically Signed   By: Gerome Sam III M.D   On: 04/26/2015 11:49   Ct Abdomen Pelvis W Contrast  04/26/2015  CLINICAL DATA:  Right-sided abdominal pain and vomiting EXAM: CT ABDOMEN AND PELVIS WITH CONTRAST TECHNIQUE: Multidetector CT imaging of the abdomen and pelvis was performed using the standard protocol following bolus administration of intravenous contrast. CONTRAST:  OMNIPAQUE IOHEXOL 300 MG/ML SOLN, 25mL OMNIPAQUE IOHEXOL 300 MG/ML SOLN COMPARISON:  07/18/2009 FINDINGS: Lower chest and abdominal wall: Bulky aortic valve calcification with thickened appearance of left ventricular walls. Left inguinal hernia which chronically contains nonobstructed or inflamed sigmoid colon. Hepatobiliary: No focal liver abnormality.No evidence of biliary obstruction or stone. Pancreas: Atrophy without inflammation or duct enlargement. Spleen: Unremarkable. Adrenals/Urinary Tract: Mildly lobulated adrenal glands without measurable nodule. Punctate stone in the lower pole left kidney. No hydronephrosis or ureteral calculus. Bladder is moderately full with chronic wall thickening. Reproductive:Chronic marked enlargement of the prostate gland deforming the bladder base.  Stomach/Bowel:  No obstruction. No appendicitis. Vascular/Lymphatic: Extensive atheromatous wall thickening and calcification of the aorta and branch vessels with celiac and SMA origin stenosis. There is also prominent plaque at the celiac bifurcation. No mass or adenopathy. Peritoneal: No ascites or pneumoperitoneum. Musculoskeletal: No acute abnormalities. IMPRESSION: 1. No acute finding. 2. Chronic left inguinal hernia containing nonobstructed sigmoid colon. 3. Bulky aortic valve calcification. If no previous echocardiography suggest outpatient follow-up to evaluate for stenosis. 4. Marked prostatomegaly with chronic outlet obstruction. The bladder is moderately distended currently. Electronically Signed   By: Marnee Spring M.D.   On: 04/26/2015 11:47    Microbiology: Recent Results (from the past 240 hour(s))  Urine culture     Status: None   Collection Time: 05/12/15  7:00 PM  Result Value Ref Range Status   Specimen Description URINE, CATHETERIZED  Final   Special Requests NONE  Final   Culture   Final    NO GROWTH 1 DAY Performed at [facility]    Report Status 05/14/2015 FINAL  Final  Blood Culture (routine x 2)     Status: None   Collection Time: 05/12/15  8:02 PM  Result Value Ref Range Status   Specimen Description BLOOD LEFT FOREARM  Final   Special Requests BOTTLES DRAWN AEROBIC ONLY 6CC  Final   Culture NO GROWTH 5 DAYS  Final   Report Status 05/17/2015 FINAL  Final  Blood Culture (routine x 2)  Status: None   Collection Time: 05/12/15  8:12 PM  Result Value Ref Range Status   Specimen Description BLOOD LEFT HAND  Final   Special Requests BOTTLES DRAWN AEROBIC ONLY [facility]  Final   Culture NO GROWTH 5 DAYS  Final   Report Status 05/17/2015 FINAL  Final     Labs: Basic Metabolic Panel:  Recent Labs Lab 05/12/15 1900 05/13/15 0625 05/14/15 0542  NA 139 140 137  K 4.1 4.2 4.2  CL 98* 105 101  CO2 GLUCOSE 136* 194* 308*  BUN 41* 31* 20   CREATININE 2.19* 1.30* 1.09  CALCIUM 10.0 8.7* 8.6*   Liver Function Tests:  Recent Labs Lab 05/12/15 1900 05/13/15 0625  AST 22 21  ALT 16* 14*  ALKPHOS 34* 28*  BILITOT 1.1 0.7  PROT 7.4 6.2*  ALBUMIN 4.0 3.2*    Recent Labs Lab 05/12/15 1900  AMMONIA 19   CBC:  Recent Labs Lab 05/12/15 1900 05/13/15 0625 05/14/15 0542  WBC 12.6* 9.7 7.0  NEUTROABS 8.8*  --   --   HGB 12.7* 11.0* 11.4*  HCT 38.1* 33.3* 33.9*  MCV 90.9 91.7 91.6  PLT 348 292 314   Cardiac Enzymes:  Recent Labs Lab 05/12/15 1900  CKTOTAL 114   BNP: BNP (last 3 results)  Recent Labs  04/22/15 1700  BNP 79.0   CBG:  Recent Labs Lab 05/15/15 1143 05/15/15 1555 05/15/15 2128 05/16/15 0811 05/17/15 1403  GLUCAP 191* 199* 293* 281* 308*    Signed:  Vassie Loll MD.  [facility] 05/19/2015, 9:13 AM

## 2015-05-19 NOTE — Clinical Social Work Note (Signed)
Marcelino Duster at [facility] contacted CSW and provided authorization for patient to go to [facility]. Auth number is [id number].  CSW facilitated discharge.  CSW notified patient's daughter's who were at bedside that patient was discharging today and would be transported to [facility] via [facility] staff.  CSW notified Keri at [facility] of patietn's discharged and provided authorization number.  Keri advised that patient was going to room 135.  CSW signing off.   Syrena Burges, Juleen China, LCSW

## 2015-05-19 NOTE — [place] FL2 (Signed)
[place] MEDICAID FL2 LEVEL OF CARE SCREENING TOOL     IDENTIFICATION  Patient Name: Juan Pham Birthdate: January 03, 1931 Sex: male Admission Date (Current Location): 05/16/2015  [facility] and [place] Number:  [employer] and Address:  [facility],  [address] [place] [address]      Provider Number: [id number]  Attending Physician Name and Address:  Vassie Loll, MD  Relative Name and Phone Number:       Current Level of Care: Hospital Recommended Level of Care: Skilled Nursing Facility Prior Approval Number:  [id number])  Date Approved/Denied:   PASRR Number:    Discharge Plan: SNF    Current Diagnoses: Patient Active Problem List   Diagnosis Date Noted  . BPH (benign prostatic hyperplasia)   . Palliative care encounter   . End of life care   . Acute kidney injury (HCC) 05/13/2015  . Dehydration 05/13/2015  . FTT (failure to thrive) in adult 05/13/2015  . UTI (lower urinary tract infection)   . Dementia with behavioral disturbance   . Pressure ulcer 04/29/2015  . Hematuria 04/28/2015  . Dysphagia, idiopathic 04/24/2015  . Hematochezia 04/22/2015  . Lower GI bleed 04/22/2015  . Insulin dependent diabetes mellitus (HCC)   . Altered mental status 04/12/2015  . Confusion 04/12/2015  . 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 Bradycardia 02/17/2015  . Hypothermia 02/17/2015  . Depression 01/23/2015  . Dementia   . Aortic stenosis, severe   . Severe aortic valve stenosis 12/18/2014  . Hypertensive heart disease 12/18/2014  . Hyperlipidemia LDL goal <70 12/18/2014  . Acute encephalopathy 09/17/2014  . Diabetes mellitus without complication (HCC)   . Aortic stenosis, severe   . GERD (gastroesophageal reflux disease) 04/21/2013  . Varicose vein 04/21/2013  . Essential hypertension 10/20/2012  . Hyperlipidemia 10/20/2012  . Stiffness of joints, not elsewhere classified, multiple sites 09/02/2012    Orientation  RESPIRATION BLADDER Height & Weight     Self, Place  Normal Incontinent Weight: 106 lb 14.8 oz (48.5 kg) Height:   (177.8 cm)  BEHAVIORAL SYMPTOMS/MOOD NEUROLOGICAL BOWEL NUTRITION STATUS      Incontinent Diet  AMBULATORY STATUS COMMUNICATION OF NEEDS Skin   Extensive Assist Verbally PU Stage and Appropriate Care   PU Stage 2 Dressing: Daily                   Personal Care Assistance Level of Assistance  Bathing, Dressing Bathing Assistance: Maximum assistance   Dressing Assistance: Maximum assistance     Functional Limitations Info             SPECIAL CARE FACTORS FREQUENCY                       Contractures      Additional Factors Info   (DNR)               Current Medications (05/19/2015):  This is the current hospital active medication list Current Facility-Administered Medications  Medication Dose Route Frequency Provider Last Rate Last Dose  . atropine 1 % ophthalmic solution 4 drop  4 drop Sublingual Q4H PRN Jeralyn Bennett, MD   4 drop at 05/17/15 1737  . diphenhydrAMINE (BENADRYL) injection 12.5 mg  12.5 mg Intravenous Q4H PRN Jeralyn Bennett, MD      . haloperidol (HALDOL) tablet 0.5 mg  0.5 mg Oral Q4H PRN Jeralyn Bennett, MD       Or  . haloperidol lactate (HALDOL) injection  0.5 mg  0.5 mg Intravenous Q4H PRN Jeralyn Bennett, MD      . LORazepam (ATIVAN) injection 1 mg  1 mg Intravenous Q4H PRN Jeralyn Bennett, MD   1 mg at 05/18/15 2109  . morphine 2 MG/ML injection 2 mg  2 mg Intravenous Q2H PRN Jeralyn Bennett, MD   2 mg at 05/18/15 1743  . ondansetron (ZOFRAN-ODT) disintegrating tablet 4 mg  4 mg Oral Q6H PRN Jeralyn Bennett, MD       Or  . ondansetron (ZOFRAN) injection 4 mg  4 mg Intravenous Q6H PRN Jeralyn Bennett, MD         Discharge Medications: Please see discharge summary for a list of discharge medications.  Relevant Imaging Results:  Relevant Lab Results:   Additional Information    Roel Douthat, Juleen China,  LCSW

## 2015-05-20 ENCOUNTER — Other Ambulatory Visit: Payer: Self-pay | Admitting: *Deleted

## 2015-05-20 ENCOUNTER — Non-Acute Institutional Stay (SKILLED_NURSING_FACILITY): Payer: PPO | Admitting: Internal Medicine

## 2015-05-20 ENCOUNTER — Encounter: Payer: Self-pay | Admitting: Internal Medicine

## 2015-05-20 DIAGNOSIS — E119 Type 2 diabetes mellitus without complications: Secondary | ICD-10-CM | POA: Diagnosis not present

## 2015-05-20 DIAGNOSIS — F0391 Unspecified dementia with behavioral disturbance: Secondary | ICD-10-CM

## 2015-05-20 DIAGNOSIS — I10 Essential (primary) hypertension: Secondary | ICD-10-CM

## 2015-05-20 DIAGNOSIS — R627 Adult failure to thrive: Secondary | ICD-10-CM | POA: Diagnosis not present

## 2015-05-20 DIAGNOSIS — F03918 Unspecified dementia, unspecified severity, with other behavioral disturbance: Secondary | ICD-10-CM

## 2015-05-20 MED ORDER — MORPHINE SULFATE (CONCENTRATE) 10 MG /0.5 ML PO SOLN: ORAL | Status: AC

## 2015-05-20 NOTE — Telephone Encounter (Signed)
[facility] 

## 2015-05-20 NOTE — Progress Notes (Signed)
Patient ID: Juan Pham, male   DOB: 22-May-1930, 82 y.o.   MRN: [id number]   This is an acute visit.  Level care skilled.  Facility [facility].  Chief complaint-acute visit status post hospitalization for altered mental status with failure to thrive.  History of results.  Patient is an 82 year old male with a history of hypertension diabetes type 2 and dementia who presented to the hospital with altered mental status.  Apparently he has had numerous recent admissions-per hospital discharge family did not want really aggressive measures with consideration for hospice or palliative care.  On this admission he was hospitalized overnight for sister urine 3 infection and a KI due to dehydration from minimal by mouth intake creatinine was apparently 2.19.  This improved with IV fluids and antibiotics-there was a decision not to follow further labs however secondary to apparently wishes for hospice or palliative care.  Apparently there were multiple family meetings with goal to pursue comfort care however when patient arrived here his daughter was concerned and was of the thought that possibly if his dementia medications Aricept and Namenda were restarted patient may improve clinically.  He is also diabetic but medications were discontinued as a plan to focus on comfort care in the hospital apparently.  Patient's blood sugars have been running in the 300 range.  I did have an extensive discussion with his daughter as well as nursing at bedside-at this point it was decided to essentially pursue hospice care with goal comfort-I did explained to daughter that her father's prognosis is quite poor and the chances of him rebounding is quite remote although we will restart his dementia meds at this point per daughter's wishes although she is aware this may have minimal impact.  We also will follow his blood sugars and give NovoLog insulin 5 units if CBG is greater than 300.  We also discuss  hospitalization antibiotics and IVs and labs--his daughter does not want any of these so that we'll be orders for no hospitalization antibiotics IV for labs.  Currently patient appears to be very frail weak with open-mouth breathing-he is on atropine under time when necessary he also has an order for Ativan every 6 hours needed for anxiety morphine has also been ordered.  Previous medical history includes a acute kidney injury dehydration.  Failure to thrive.  UTI.  Dementia.  Diabetes type 2.  Hypertension.  Social history patient quit smoking 32 years ago this included a pipe no use of smokeless tobacco alcohol or illicit drug use.  Family history significant for heart disease in his mother apparently there is not a family history of dementia.  Review of systems essentially unobtainable as noted above.  Physical exam.  Temp is 98.7 pulse 92 respirations 22 blood pressure 140/70 O2 saturation is 94%.  In general this is a very frail-appearing elderly male he does not appear to be in any distress but extremely weak he does have somewhat open-mouth breathing.  Skin is warm and dry.  Eyes she does have some slight irritated appearance to his eyes I do not see any exudate pupils appear reactive to light.  Oropharynx is clear mucous membranes appear dry.  Chest he does not really respond to verbal commands there is shallow air entry I could not appreciate any overt congestion.  Heart is regular rate and rhythm with a 2 out of 3 systolic ejection murmur he does not have significant lower extremity edema.  His abdomen soft nontender there are positive bowel sounds.  Muscle skeletal appears to have somewhat of a generalized fidgeting type presentation with some mild jerking at times although he does not appear to be in any distress extremely frail however.  Neurologic as noted above he does not really respond to verbal commands or make eye contact.  Psych as noted above appears  to have significant dementia.  Labs.  05/14/2015.  Sodium 137 potassium 4.2 BUN 20 creatinine 1.09.  WBC 7.0 hemoglobin 11.4 platelets 314.  Assessment plan.  #1 failure to thrive as noted above there was an extensive discussion with his daughter at bedside-at this point will pursue essentially comfort care will write for a hospice consult as soon as possible-orders for no hospitalizations no antibiotics no IVs in no labs.  Regards to diabetes type 2 blood sugars are running somewhat high will write an order to give NovoLog 5 units if CBG is greater than 303 times a day.  In regards dementia this appears end-stage but per discussion with daughter will try to titrate back the Aricept and Namenda although her daughter is aware of this may have very limited utility.  Hypertension axis appears to be stable at this point again will pursue very conservative measures in regards to this no longer on any medication for this.  UTI-urinalysis in hospital showed many bacteria with nitrites treated initially with antibiotics and subsequently discontinued following wishes for comfort care.  In regards to comfort he is on atropine as needed as well as Haldol for any anxiety 0.5 mg every 4 hours when necessary for delirium morphine has been started every 4 hours when necessary for pain he is also on Ativan every 6 hours for anxiety or depression  His Norvasc Coreg and Norco have been discontinued as well as Novolin 70/30 again we will start the Humalog as needed.  Again patient's status will have to be monitored closely emphasis will be on comfort-.  [id number] note greater than 35 minutes spent assessing patient-reviewing his chart-discussing with his daughter at bedside-and coordinating and formulating a plan of care-of note greater than 50% time spent corning plan of care with daughter's input

## 2015-05-22 ENCOUNTER — Non-Acute Institutional Stay (SKILLED_NURSING_FACILITY): Payer: PPO | Admitting: Internal Medicine

## 2015-05-22 DIAGNOSIS — F0391 Unspecified dementia with behavioral disturbance: Secondary | ICD-10-CM | POA: Diagnosis not present

## 2015-05-22 DIAGNOSIS — G253 Myoclonus: Secondary | ICD-10-CM

## 2015-05-22 DIAGNOSIS — F05 Delirium due to known physiological condition: Secondary | ICD-10-CM

## 2015-05-22 DIAGNOSIS — E119 Type 2 diabetes mellitus without complications: Secondary | ICD-10-CM

## 2015-05-22 DIAGNOSIS — E860 Dehydration: Secondary | ICD-10-CM

## 2015-05-22 DIAGNOSIS — F03918 Unspecified dementia, unspecified severity, with other behavioral disturbance: Secondary | ICD-10-CM

## 2015-05-22 DIAGNOSIS — R410 Disorientation, unspecified: Secondary | ICD-10-CM

## 2015-05-22 NOTE — Progress Notes (Signed)
Patient ID: Juan Pham, male   DOB: 1930/08/14, 82 y.o.   MRN: [id number]   Facility [facility] Chief complaint; admission to SNF post stay at [facility] 2/617 through 05/19/15 History; this is a patient who has undergone now 6 hospitalizations since the beginning of the year. This is mostly secondary to acute on chronic confusion [delirium on top of dementia] Acute renal failure, failure to thrive, consideration for end-of-life care. There is been numerous comments about UTIs although I don't see a single positive culture nor positive blood cultures. On this occasion he was felt to be dehydrated based on a creatinine of 2.19. He had a CT scan of the head which was negative for an acute change. He received IV fluids and empiric antibiotics. There was some discussion about whether or not this patient was hospice and/or a rehabilitation candidate. In the hospital he was apparently decided to be a hospice patient and then right at the end of the Stay was said that he was a rehabilitation candidate. Most of what I see on the current skilled records suggest that he is full comfort care and nothing else. He has not had follow-up lab work. The nursing home staff State that his appetite is poor and his intake extremely variable. Apparently he was seen by hospice of [facility] on one occasion year and they found him in a "lucid moment" and stated that he needed rehabilitation And was not necessarily a hospice candidate. He has a diabetic he is only on a loose sliding scale insulin most of his medications are comfort measures.   I have reviewed his imaging studies and echocardiogram. Notable for the fact that he has had 5 he scans of the head in the last year and 2 MRIs. Echocardiogram as previously shown aortic stenosis  CMP Latest Ref Rng 05/14/2015 05/13/2015 05/12/2015  Glucose 65 - 99 mg/dL 409(W) 119(J) 478(G)  BUN 6 - 20 mg/dL 20 95(A) 21(H)  Creatinine 0.61 - 1.24 mg/dL 0.86 5.78(I) 6.96(E)  Sodium  135 - 145 mmol/L 137 140 139  Potassium 3.5 - 5.1 mmol/L 4.2 4.2 4.1  Chloride 101 - 111 mmol/L 101 105 98(L)  CO2 22 - 32 mmol/L Calcium 8.9 - 10.3 mg/dL 9.5(M) 8.4(X) 32.4  Total Protein 6.5 - 8.1 g/dL - 6.2(L) 7.4  Total Bilirubin 0.3 - 1.2 mg/dL - 0.7 1.1  Alkaline Phos 38 - 126 U/L - 28(L) 34(L)  AST 15 - 41 U/L - 21 22  ALT 17 - 63 U/L - 14(L) 16(L)    CBC Latest Ref Rng 05/14/2015 05/13/2015 05/12/2015  WBC 4.0 - 10.5 K/uL 7.0 9.7 12.6(H)  Hemoglobin 13.0 - 17.0 g/dL 11.4(L) 11.0(L) 12.7(L)  Hematocrit 39.0 - 52.0 % 33.9(L) 33.3(L) 38.1(L)  Platelets 150 - 400 K/uL 314 292 348     Nl ef with Gr 2 dd;  mild-mod MS;AV sclerosis with mild - mod AR; LA severely dilated     PACS Images     Show images for Echocardiogram     Narrative                              [facility]                        [address]  [place], [place] [address]                            [phone]  ------------------------------------------------------------------- Transthoracic Echocardiography  Patient:    Juan Pham, Juan Pham MR #:       [id number] Study Date: 11/19/2014 Gender:     M Age:        6 Height:     157.5 cm Weight:     66.7 kg BSA:        1.73 m^2 Pt. Status: Room:   ATTENDING    Nicki Guadalajara, M.D.  ORDERING     Nicki Guadalajara, M.D.  REFERRING    Nicki Guadalajara, M.D.  PERFORMING   [facility], Outpatient  SONOGRAPHER  [facility], RD[facility]  cc:  ------------------------------------------------------------------- LV EF: 60% -   65%  ------------------------------------------------------------------- Indications:      Aortic Stenosis (I35.0).  ------------------------------------------------------------------- History:   Risk factors:  Bradycardia  Family history of coronary artery disease. Former tobacco use. Hypertension. Diabetes mellitus. Dyslipidemia.  ------------------------------------------------------------------- Study  Conclusions  - Left ventricle: The cavity size was normal. Systolic function was   normal. The estimated ejection fraction was in the range of 60%   to 65%. Wall motion was normal; there were no regional wall   motion abnormalities. Features are consistent with a pseudonormal   left ventricular filling pattern, with concomitant abnormal   relaxation and increased filling pressure (grade 2 diastolic   dysfunction). Doppler parameters are consistent with high   ventricular filling pressure. - Aortic valve: Severe diffuse thickening and calcification. Valve   mobility was restricted. There was severe stenosis. There was   mild to moderate regurgitation. Valve area (VTI): 1.26 cm^2.   Valve area (Vmax): 1.07 cm^2. Valve area (Vmean): 1.14 cm^2. - Mitral valve: Calcified annulus. Moderate diffuse thickening and   calcification. The findings are consistent with mild to moderate   stenosis. There was trivial regurgitation. Valve area by pressure   half-time: 1.54 cm^2. Valve area by continuity equation (using   LVOT flow): 1.92 cm^2. - Left atrium: The atrium was severely dilated.  Transthoracic echocardiography.  M-mode, complete 2D, spectral Doppler, and color Doppler.  Birthdate:  Patient birthdate: 10/01/1930.  Age:  Patient is 82 yr old.  Sex:  Gender: male. BMI: 26.9 kg/m^2.  Blood pressure:     156/50  Patient status: Outpatient.  Study date:  Study date: 11/19/2014. Study time: 02:03 PM.  Location:  Echo laboratory.  -------------------------------------------------------------------  ------------------------------------------------------------------- Left ventricle:  The cavity size was normal. Systolic function was normal. The estimated ejection fraction was in the range of 60% to 65%. Wall motion was normal; there were no regional wall motion abnormalities. Features are consistent with a pseudonormal left ventricular filling pattern, with concomitant abnormal relaxation and  increased filling pressure (grade 2 diastolic dysfunction). Doppler parameters are consistent with high ventricular filling pressure.  ------------------------------------------------------------------- Aortic valve:   Trileaflet.  Severe diffuse thickening and calcification. Valve mobility was restricted.  Doppler:   There was severe stenosis.   There was mild to moderate regurgitation.    VTI ratio of LVOT to aortic valve: 0.3. Valve area (VTI): 1.26 cm^2. Indexed valve area (VTI): 0.73 cm^2/m^2. Peak velocity ratio of LVOT to aortic valve: 0.26. Valve area (Vmax): 1.07 cm^2. Indexed valve area (Vmax): 0.62 cm^2/m^2. Mean velocity ratio of LVOT to aortic valve: 0.27. Valve area (Vmean): 1.14 cm^2. Indexed valve area (Vmean): 0.66 cm^2/m^2.    Mean gradient (S): 40  mm Hg. Peak gradient (S): 81 mm Hg.  ------------------------------------------------------------------- Aorta:  Aortic root: The aortic root was normal in size.  ------------------------------------------------------------------- Mitral valve:   Calcified annulus.  Moderate diffuse thickening and calcification. Mobility was not restricted.  Doppler:   The findings are consistent with mild to moderate stenosis.   There was trivial regurgitation.    Valve area by pressure half-time: 1.54 cm^2. Indexed valve area by pressure half-time: 0.89 cm^2/m^2. Valve area by continuity equation (using LVOT flow): 1.92 cm^2. Indexed valve area by continuity equation (using LVOT flow): 1.11 cm^2/m^2.    Mean gradient (D): 3 mm Hg. Peak gradient (D): 9 mm Hg.  ------------------------------------------------------------------- Left atrium:  The atrium was severely dilated.  ------------------------------------------------------------------- Right ventricle:  The cavity size was normal. Wall thickness was normal. Systolic function was normal.  ------------------------------------------------------------------- Pulmonic valve:     Structurally normal valve.   Cusp separation was normal.  Doppler:  Transvalvular velocity was within the normal range. There was no evidence for stenosis. There was no regurgitation.  ------------------------------------------------------------------- Tricuspid valve:   Structurally normal valve.    Doppler: Transvalvular velocity was within the normal range. There was trivial regurgitation.  ------------------------------------------------------------------- Pulmonary artery:   The main pulmonary artery was normal-sized. Systolic pressure was within the normal range.  ------------------------------------------------------------------- Right atrium:  The atrium was normal in size.  ------------------------------------------------------------------- Pericardium:  There was no pericardial effusion.  ------------------------------------------------------------------- Systemic veins: Inferior vena cava: The vessel was dilated. The respirophasic diameter changes were blunted (< 50%), consistent with elevated central venous pressure. Diameter: 21.2 mm.         Study Result       CLINICAL DATA:  Patient found unresponsive tonight. Initial encounter.   EXAM: CT HEAD WITHOUT CONTRAST   TECHNIQUE: Contiguous axial images were obtained from the base of the skull through the vertex without intravenous contrast.   COMPARISON:  Head CT scan 04/26/2015.   FINDINGS: There is atrophy and chronic microvascular ischemic change. No evidence of acute intracranial abnormality including hemorrhage, infarct, mass lesion, mass effect, midline shift or abnormal extra-axial fluid collection is seen. No hydrocephalus or pneumocephalus. The calvarium is intact. Imaged paranasal sinuses and mastoid air cells are clear.   IMPRESSION: No acute abnormality.   Atrophy and chronic microvascular ischemic change.     Electronically Signed   By: Drusilla Kanner M.D.   On: 05/12/2015 19:39                 Vitals          CLINICAL DATA:  Right-sided abdominal pain and vomiting   EXAM: CT ABDOMEN AND PELVIS WITH CONTRAST   TECHNIQUE: Multidetector CT imaging of the abdomen and pelvis was performed using the standard protocol following bolus administration of intravenous contrast.   CONTRAST:  OMNIPAQUE IOHEXOL 300 MG/ML SOLN, 25mL OMNIPAQUE IOHEXOL 300 MG/ML SOLN   COMPARISON:  07/18/2009   FINDINGS: Lower chest and abdominal wall: Bulky aortic valve calcification with thickened appearance of left ventricular walls.   Left inguinal hernia which chronically contains nonobstructed or inflamed sigmoid colon.   Hepatobiliary: No focal liver abnormality.No evidence of biliary obstruction or stone.   Pancreas: Atrophy without inflammation or duct enlargement.   Spleen: Unremarkable.   Adrenals/Urinary Tract: Mildly lobulated adrenal glands without measurable nodule. Punctate stone in the lower pole left kidney. No hydronephrosis or ureteral calculus. Bladder is moderately full with chronic wall thickening.   Reproductive:Chronic marked enlargement of the prostate gland deforming the bladder base.  Stomach/Bowel:  No obstruction. No appendicitis.   Vascular/Lymphatic: Extensive atheromatous wall thickening and calcification of the aorta and branch vessels with celiac and SMA origin stenosis. There is also prominent plaque at the celiac bifurcation. No mass or adenopathy.   Peritoneal: No ascites or pneumoperitoneum.   Musculoskeletal: No acute abnormalities.   IMPRESSION: 1. No acute finding. 2. Chronic left inguinal hernia containing nonobstructed sigmoid colon. 3. Bulky aortic valve calcification. If no previous echocardiography suggest outpatient follow-up to evaluate for stenosis. 4. Marked prostatomegaly with chronic outlet obstruction. The bladder is moderately distended currently.     Electronically Signed   By: Marnee Spring M.D.    On: 04/26/2015 11:47    Past Medical History  Diagnosis Date  . Hypertension   . Hyperlipidemia   . Bradycardia   . Diabetes mellitus without complication (HCC)     type II  . Aortic stenosis   . Diastolic dysfunction     grade 1  . Dementia   . Atrial fibrillation (HCC)   . 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 Cateract    . Retna    . Cardiac catheterization N/A 12/22/2014    Procedure: Right/Left Heart Cath and Coronary Angiography;  Surgeon: Lennette Bihari, MD;  Location: [facility] INVASIVE CV LAB;  Service: Cardiovascular;  Laterality: N/A;  . Esophagogastroduodenoscopy N/A 04/24/2015    Procedure: ESOPHAGOGASTRODUODENOSCOPY (EGD);  Surgeon: West Bali, MD;  Location: [facility] ENDO SUITE;  Service: Endoscopy;  Laterality: N/A;  . Esophageal dilation N/A 04/24/2015    Procedure: ESOPHAGEAL DILATION;  Surgeon: West Bali, MD;  Location: [facility] END[facility] SU[facility];  Service: Endoscopy;  Laterality: N/A;  . Flexible sigmoidoscopy N/A 04/24/2015    Procedure: FLEXIBLE SIGMOIDOSCOPY;  Surgeon: West Bali, MD;  Location: [facility] END[facility] SUITE;  Service: Endoscopy;  Laterality: N/A;    Medications Haldol 0.5 every 4 hours when necessary Ativan 0.5 every 6 hours when necessary Roxanol 5 mg every 4 hours when necessary Namenda 5 mg by mouth daily Aricept 5 mg by mouth daily at bedtime NovoLog 5 units 3 times daily for blood sugar greater than 300  Social history; I really have no information on this. Apparently there is been some discussion about him going home with hospice with the daughter although the family has fluctuated on this. As noted hospice of [facility] seems to have caught him at a lucid moment when they came to see him and did not except him for the hospice house. The patient is a DO NOT RESUSCITATE  reports that he quit smoking about 32 years ago. His smoking use included Pipe. He has never used smokeless tobacco. He  reports that he does not drink alcohol or use illicit drugs.  Family History  Problem Relation Age of Onset  . Heart disease Mother   . Dementia Neg Hx     Review of systems; This is not really possible from the patient. Per the staff he eats marginally and intermittently. Clearly having some swallowing issues  Physical examination Gen. somewhat gaunt emaciated 82 year old man. He is awake but difficult to understand Vitals O2 sat is 96% on room air respirations 20 pulse rate 82 HEENT; pupils equal and reactive Oral exam; dry mucous membranes Respiratory; clear air entry bilaterally Cardiac heart sounds are normal 3/6 systolic ejection murmur radiating into both carotids. Mild to moderate dehydration Abdomen; no liver no spleen no masses no tenderness GU; no bladder distention, no  tenderness no CVA tenderness Neurologic; he appears able to move all limbs. He has diffuse myoclonic jerking worse in the right arm. Reflexes appear symmetric toes are downgoing bilaterally. He has advanced frontal lobe signs. Marked rigidity in his arms and legs Mental status; he is able to speak. Not able to answer too many direct questions. Body was in [place]. Stated he was 82 years old. Said he had 3 children but could not name them. Skin; he is certainly at risk for pressure ulceration although I I don't see any in the usual pressure areas  Impression/plan #1 recurrent delirium on top of background dementia which is probably severe. Apparently the delirium is felt to be secondary to dehydration/prerenal azotemia and recurrent UTIs although I don't see any evidence of the recurrent UTIs. The dementia could be any of the usual types although imaging studies have not suggested significant vascular disease. Statistically likely to be advanced Alzheimer's disease. Whether he might have an unusual form of dementia is probably academic at this point. The marked rigidity at this point really doesn't add  anything to the differential diagnosis as this can be an accompaniment of any advanced dementia #2 dehydration; this is evident at the bedside. I am not clear whether any for their support measures have been discussed or agreed upon by the family. He has not had any further lab work no IV fluids. #3 type 2 diabetes on insulin. He is only on a loose sliding scale. His blood sugars run consistently well into the 300s, once again this is not the cause of any symptomatology and these truly hospice nothing further needs to be done #4. History of prostate enlargement noted on recent CT scan; there is nothing clinically to suggest these and significant urinary retention currently. One of his recent admission suggested hematuria #5 myoclonic jerking this could be secondary to any of his advanced dementia is. There is no evidence is having seizures #6 marginal oral intake. I actually attempted to feed him and while I was in the room he didn't do too badly. He is however aspirating #7 rehabilitation potential question: As he is as I am seeing him today, there is certainly no rehabilitation potential that would be obviously useful. I am wondering whether he was actually better at one point that he is currently  Overall; this man would be clearly hospice eligible as I am seeing him this morning. Think there is been innumerable conversation with various family members however I have not been a part of this. A plan of care moving forward is going to be important. I will have hospice really look at this

## 2015-05-23 ENCOUNTER — Other Ambulatory Visit: Payer: Self-pay | Admitting: *Deleted

## 2015-05-23 MED ORDER — MORPHINE SULFATE (CONCENTRATE) 20 MG/ML PO SOLN: ORAL | Status: AC

## 2015-05-23 NOTE — Telephone Encounter (Signed)
[facility] 

## 2015-06-08 DEATH — deceased

## 2015-06-28 ENCOUNTER — Encounter: Payer: Self-pay | Admitting: Gastroenterology

## 2015-07-25 ENCOUNTER — Ambulatory Visit: Payer: Medicare Other | Admitting: Nurse Practitioner

## 2016-06-02 IMAGING — DX DG CHEST 2V
2 series · 2 of 2 positions shown · non-contrast
Comparison: 06/16/2014

CLINICAL DATA: Disorientation, dizziness, history hypertension,
diabetes, former smoker

EXAM:
CHEST  2 VIEW

[chest lat]
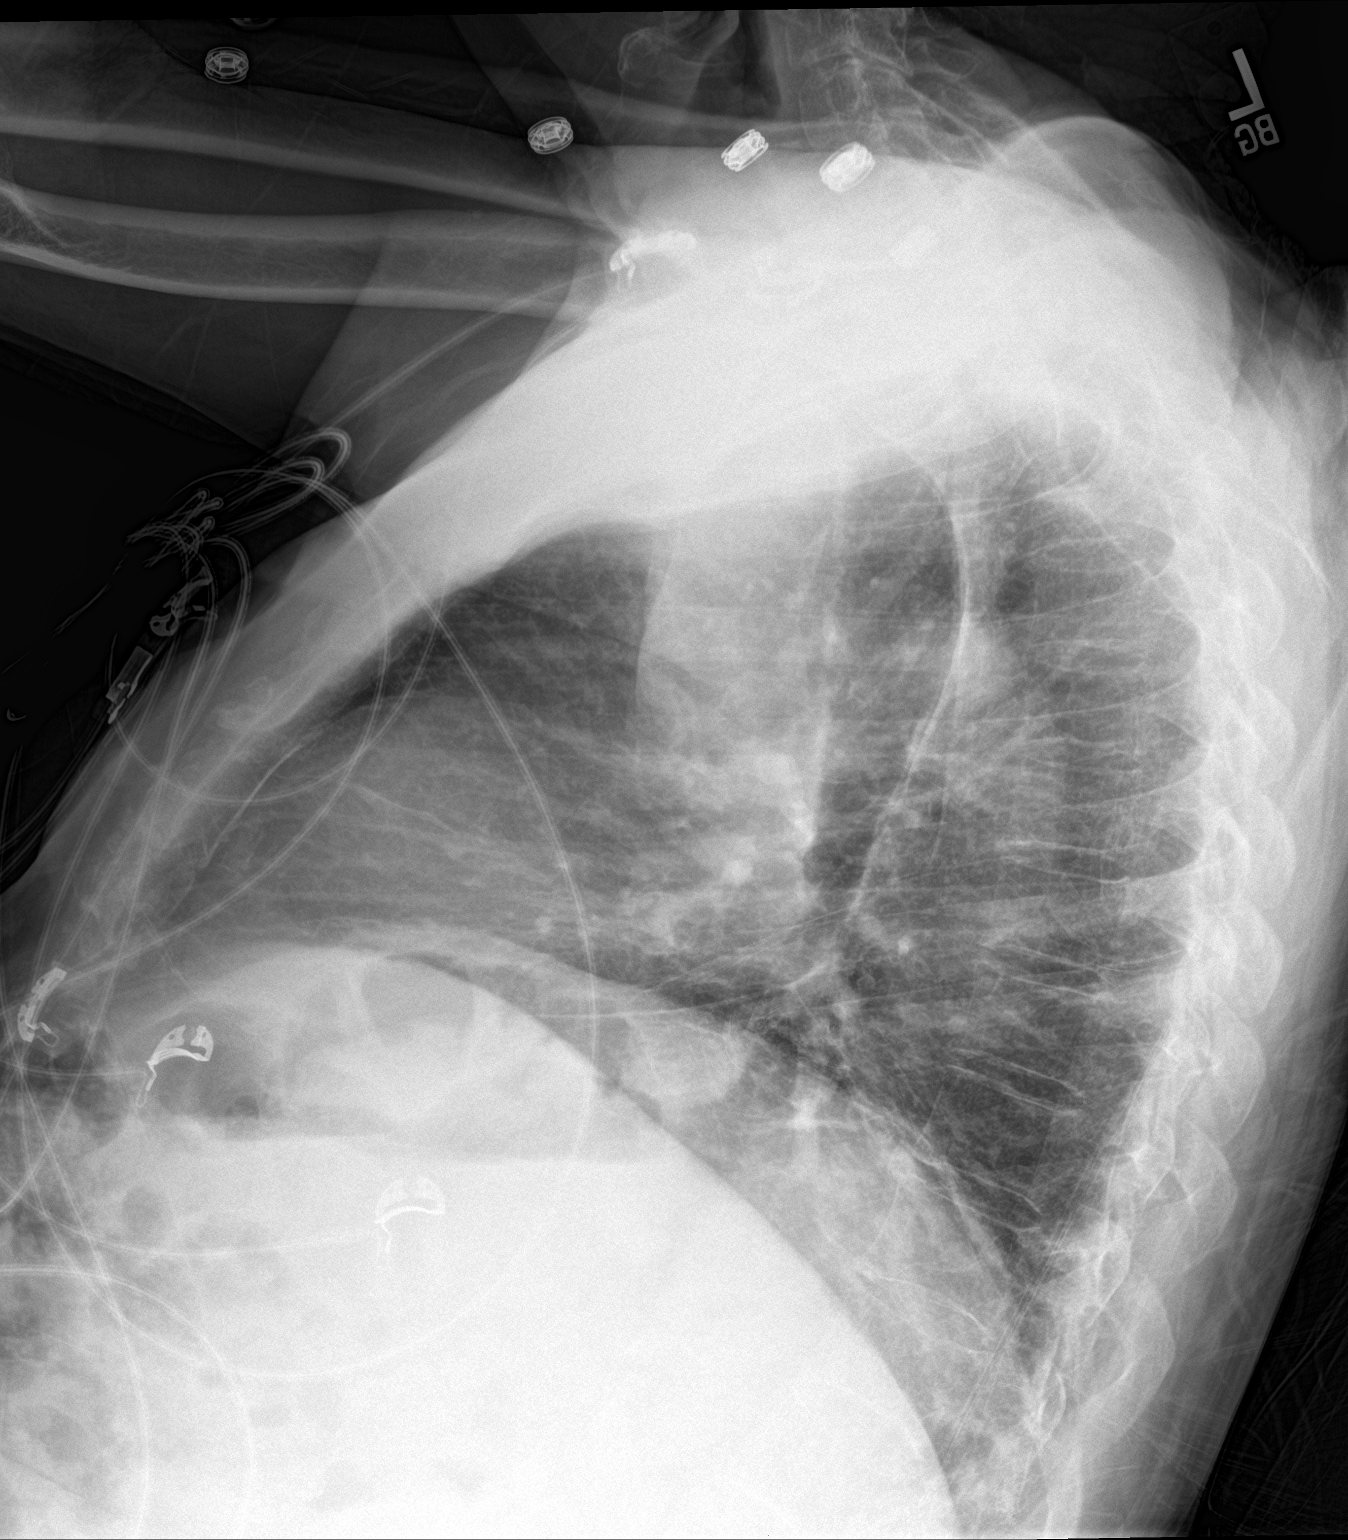

[chest ap strecther]
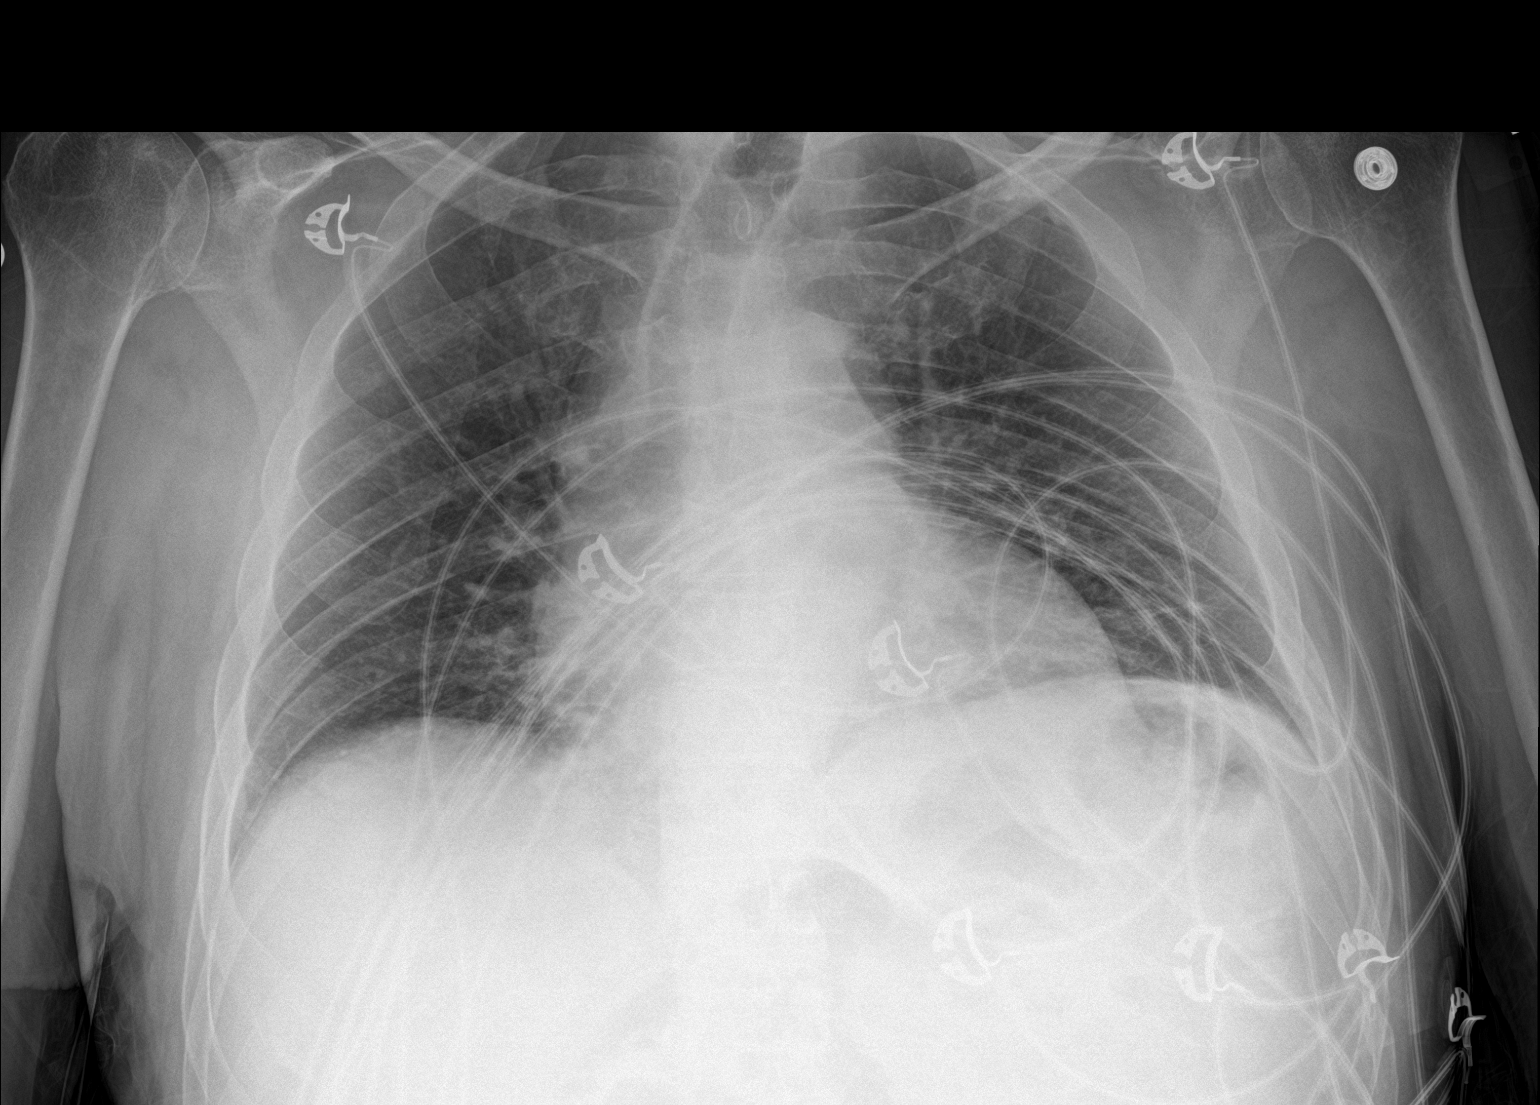

[2 of 2 positions shown; findings below may reference images not displayed]

FINDINGS: Upper normal heart size.

Tortuous aorta.

Mediastinal contours and pulmonary vascularity normal.

Bibasilar atelectasis.

Lungs otherwise clear.

No pleural effusion or pneumothorax.
IMPRESSION: Bibasilar atelectasis.

## 2016-06-02 IMAGING — CT CT HEAD W/O CM
1 series · 16 of 30 positions shown, 20 images · non-contrast
Comparison: 06/16/2014

CLINICAL DATA: Altered mental status. Confusion. Lethargy. Diabetes
and hypertension.

EXAM:
CT HEAD WITHOUT CONTRAST
TECHNIQUE: Contiguous axial images were obtained from the base of the skull
through the vertex without intravenous contrast.

[Series 2: headseq 4.8 h37s · axial · 0.51mm/px · z∈[+114,+272]mm · 16 of 36 slices shown, 20 images]
[im 2/36  brain]
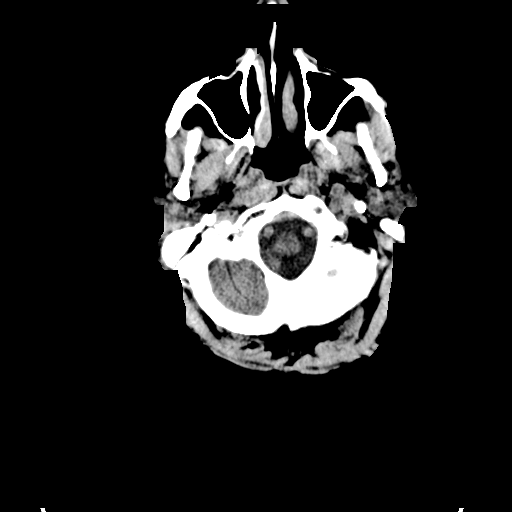
[im 2/36  bone]
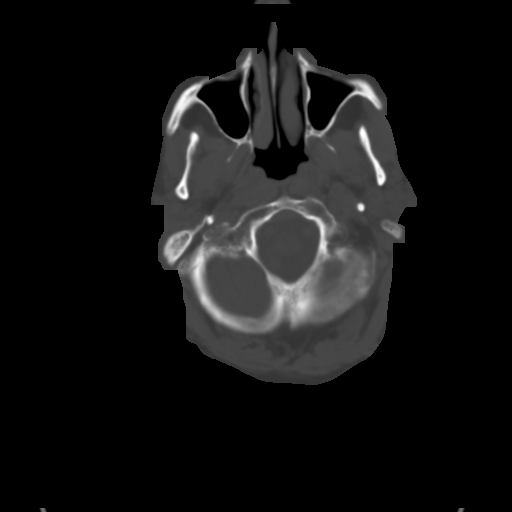
[im 4/36  brain]
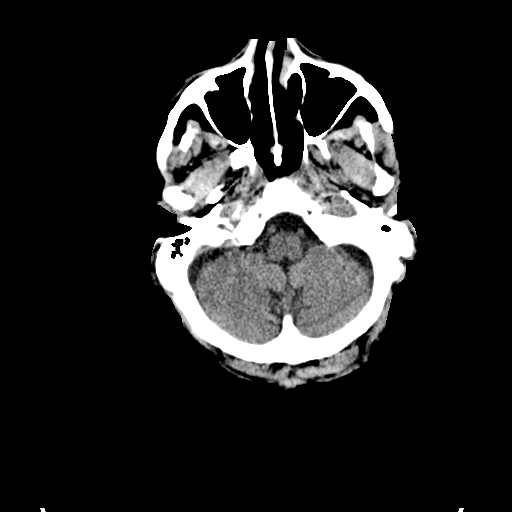
[im 7/36  brain]
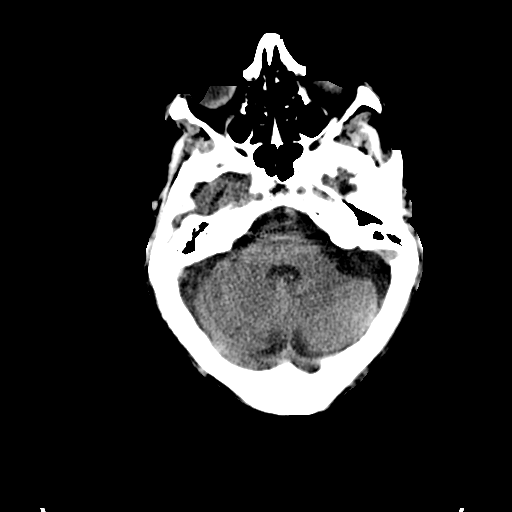
[im 9/36  brain]
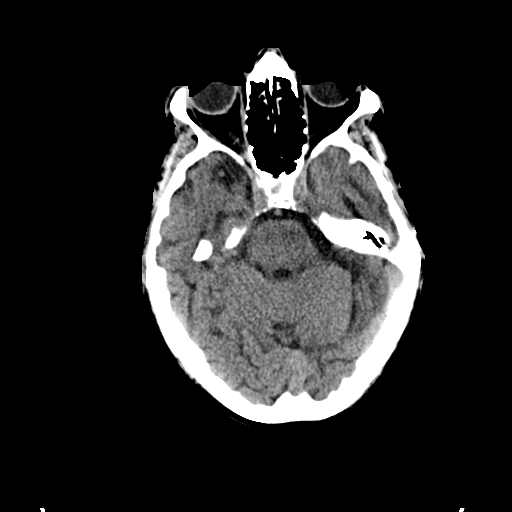
[im 10/36  brain]
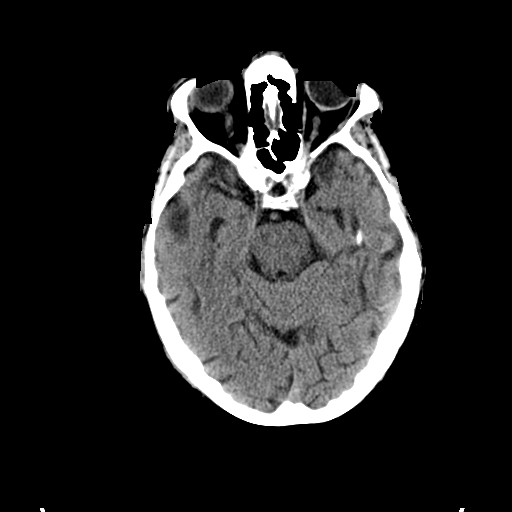
[im 10/36  bone]
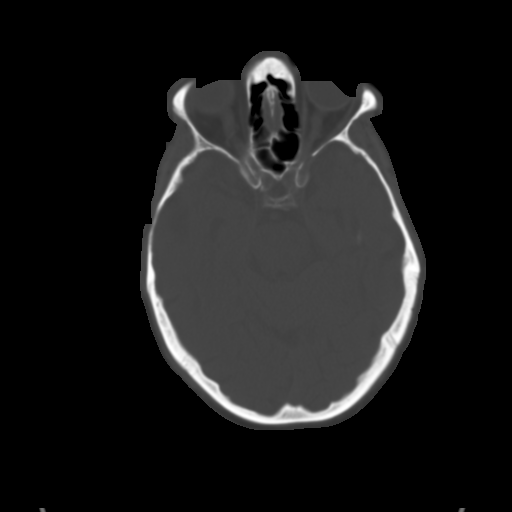
[im 13/36  brain]
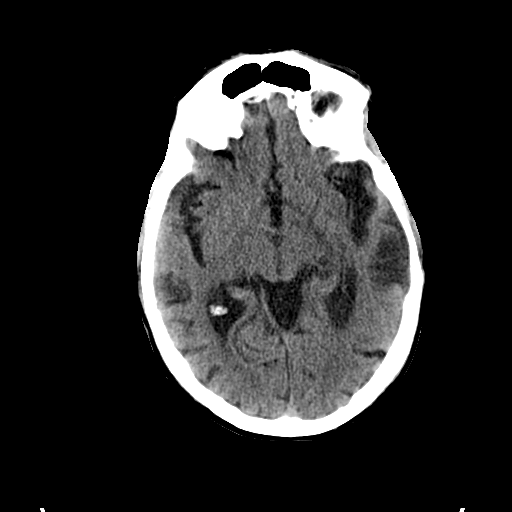
[im 15/36  brain]
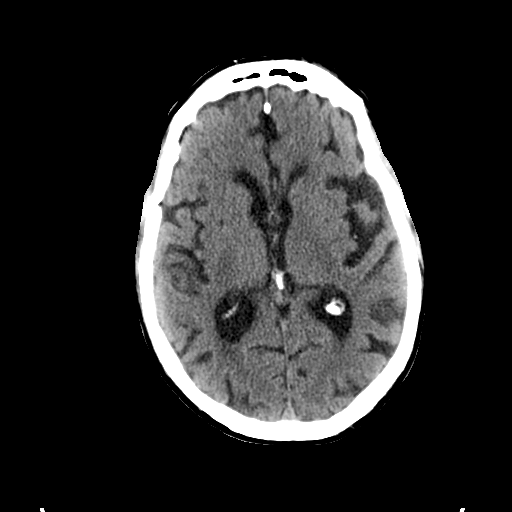
[im 17/36  brain]
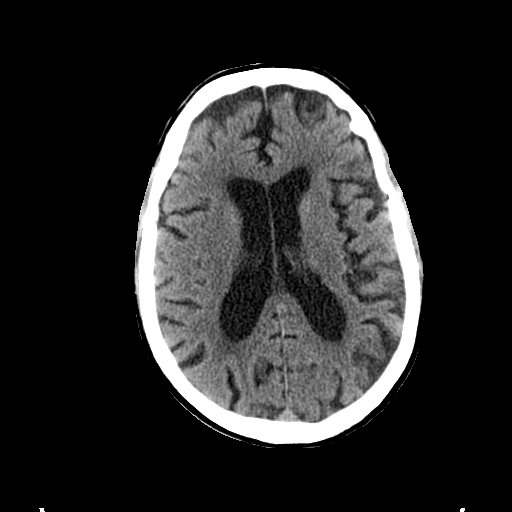
[im 19/36  brain]
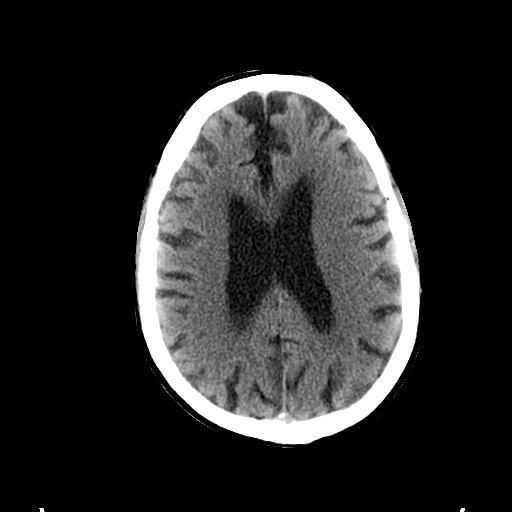
[im 19/36  bone]
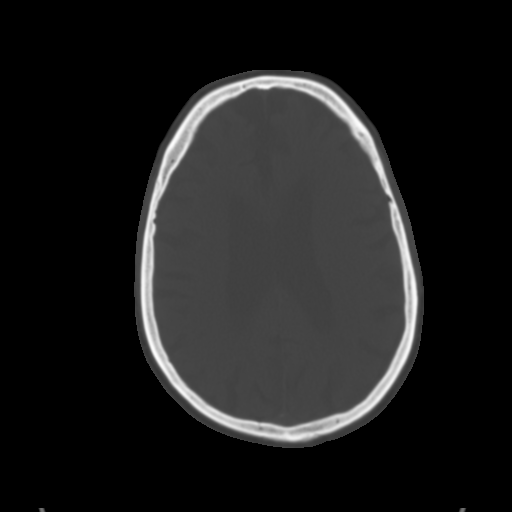
[im 21/36  brain]
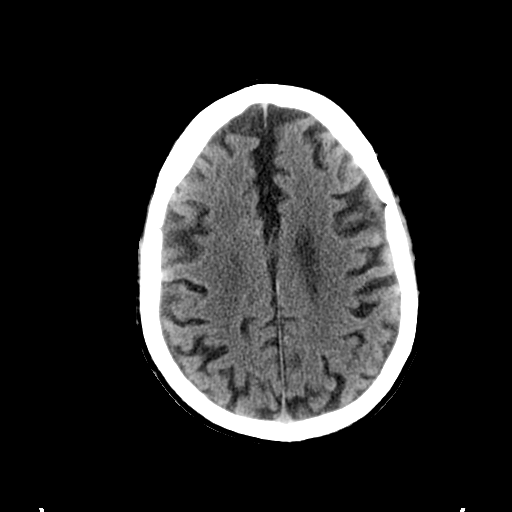
[im 23/36  brain]
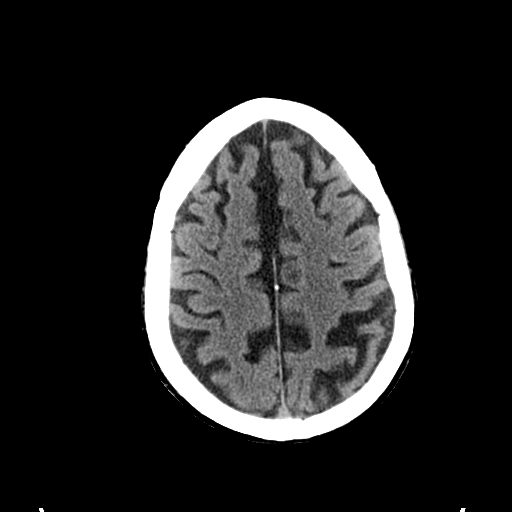
[im 26/36  brain]
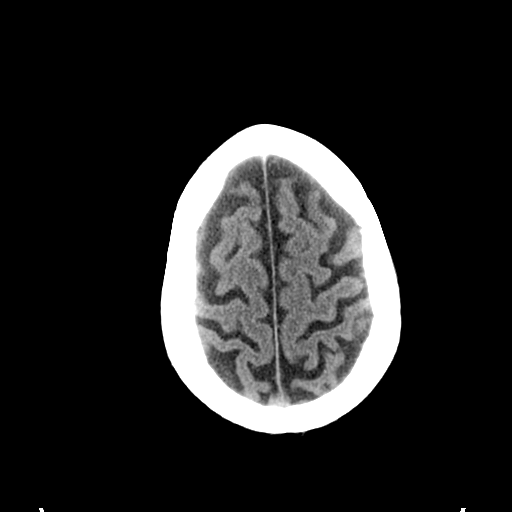
[im 27/36  brain]
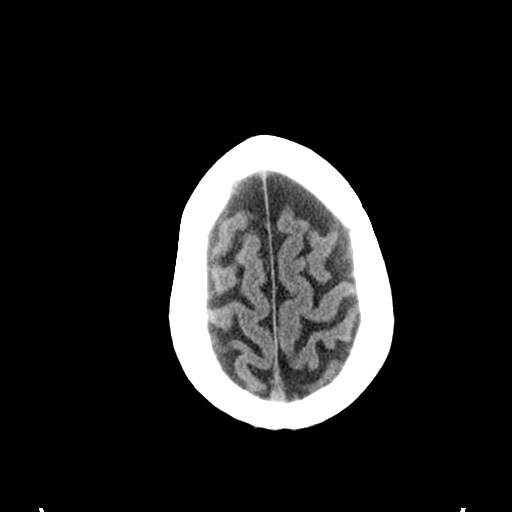
[im 27/36  bone]
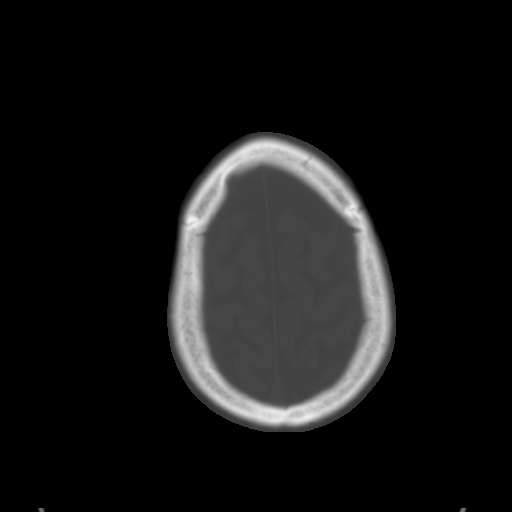
[im 29/36  brain]
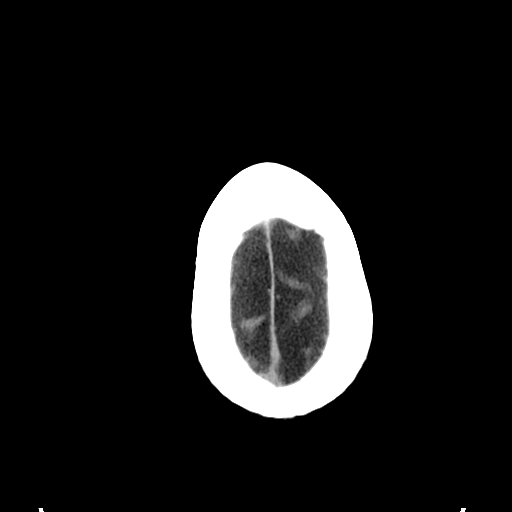
[im 32/36  brain]
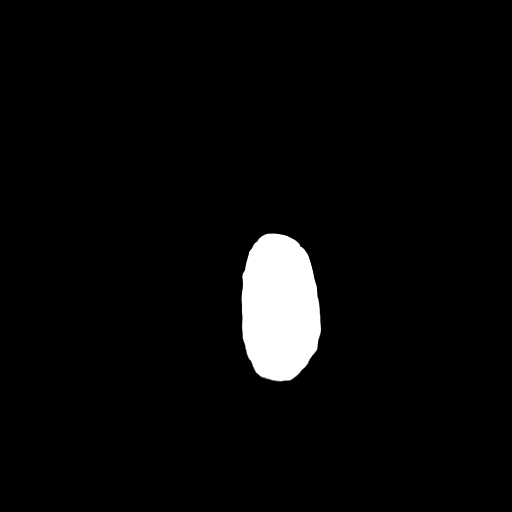
[im 34/36  brain]
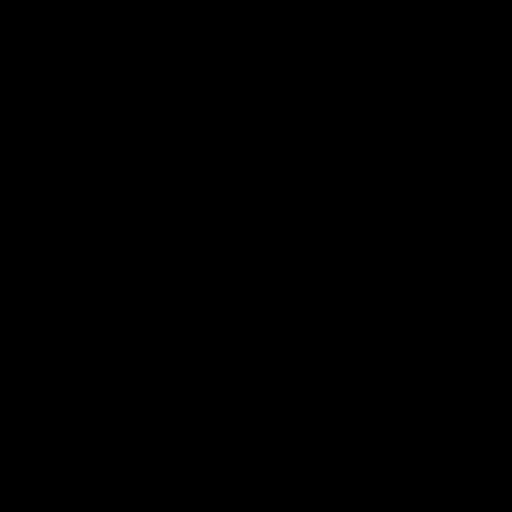

[16 of 30 positions shown; findings below may reference images not displayed]

FINDINGS: There is no evidence of intracranial hemorrhage, brain edema, or
other signs of acute infarction. There is no evidence of
intracranial mass lesion or mass effect. No abnormal extraaxial
fluid collections are identified.

Moderate diffuse cerebral atrophy is stable. Ventricles are stable
in size. Old bilateral lentiform nuclei lacunar infarcts are again
noted. No skull abnormality identified.
IMPRESSION: No acute intracranial findings.

Stable cerebral atrophy and old bilateral basal ganglia lacunar
infarcts.

## 2016-11-02 IMAGING — CR DG CHEST 1V PORT
1 series · 1 of 1 positions shown · non-contrast
Comparison: 09/17/2014

CLINICAL DATA: Hypoglycemia.  Confusion tonight.

EXAM:
PORTABLE CHEST 1 VIEW

[ap]
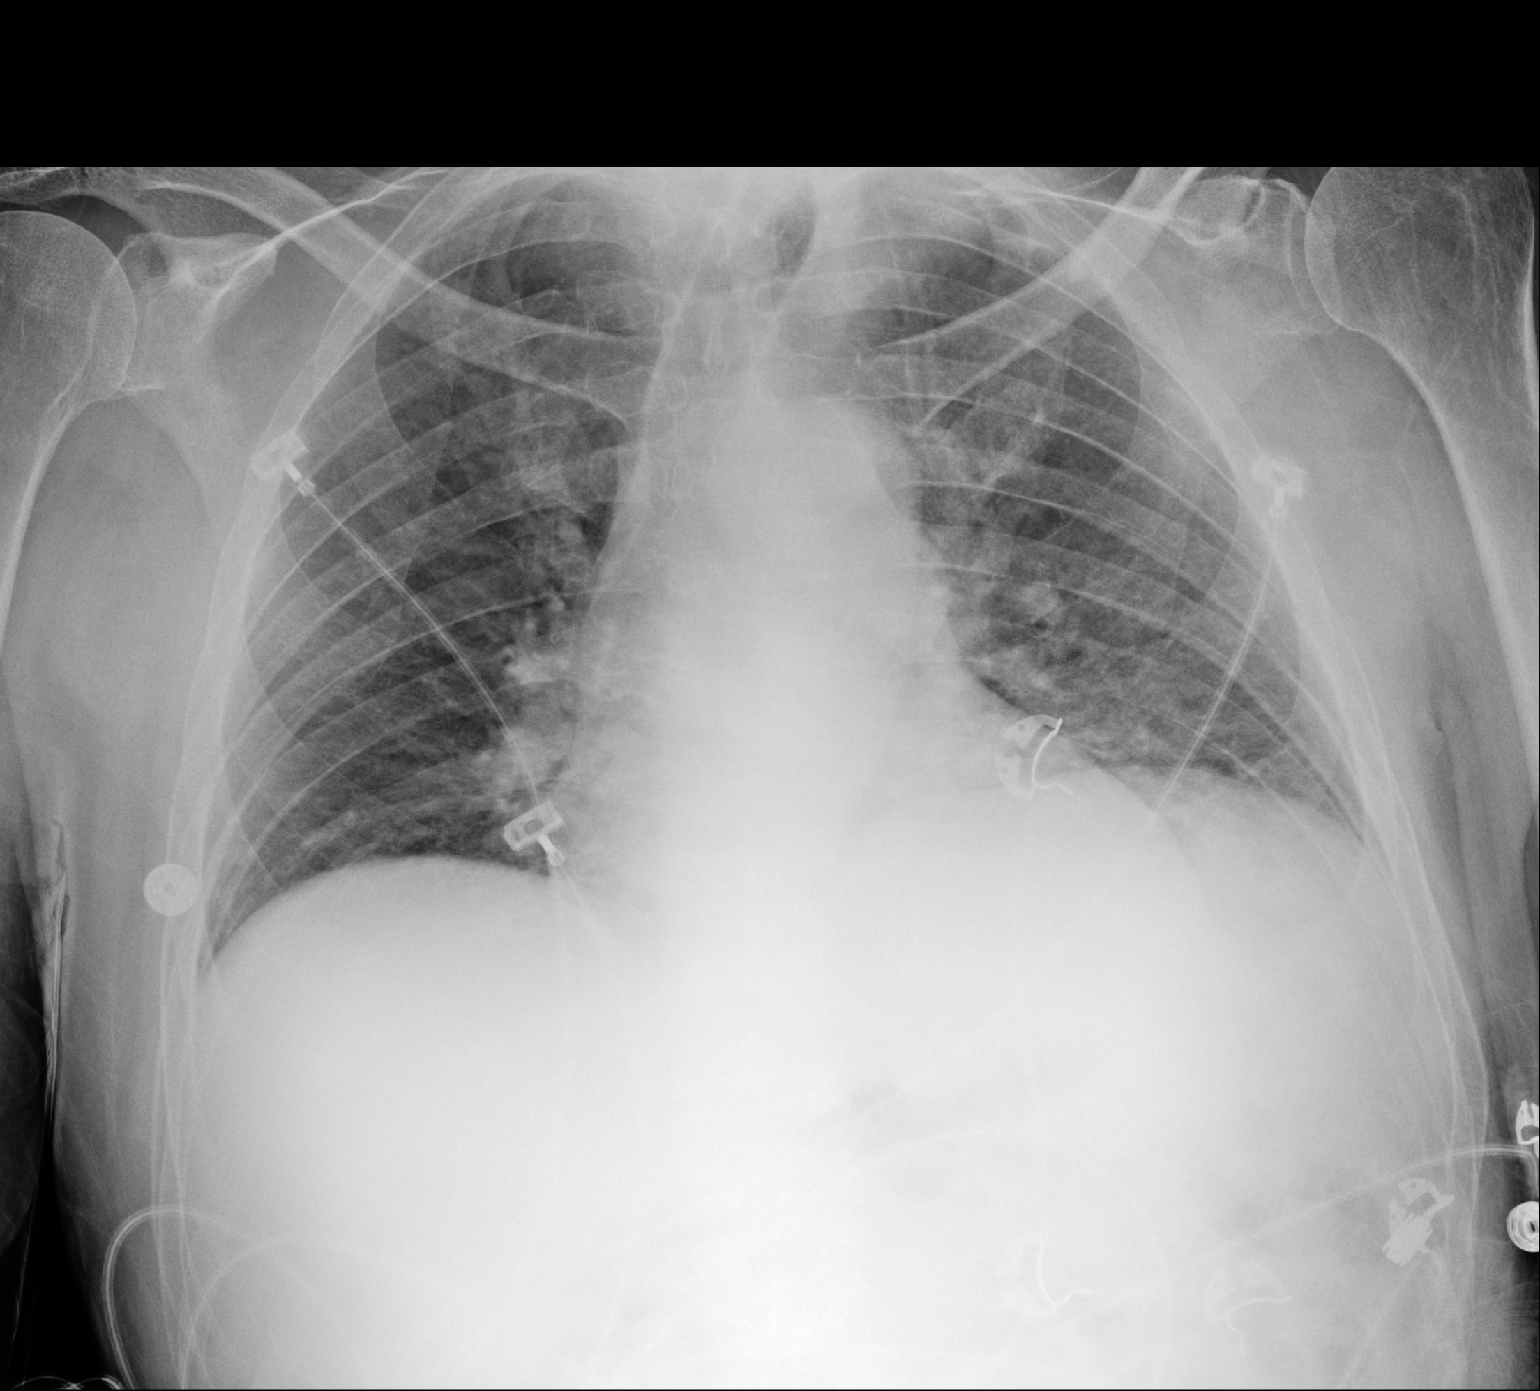

[1 of 1 positions shown; findings below may reference images not displayed]

FINDINGS: Lung volumes are low. Cardiomediastinal contour is with cardiomegaly
are unchanged. Patchy opacity at the left lung base, unchanged right
basilar atelectasis. No pleural effusion pneumothorax or pulmonary
edema.
IMPRESSION: Low lung volumes. Patchy opacity at the left lung base, atelectasis
versus early pneumonia. Minimal right basilar atelectasis.

## 2017-01-11 IMAGING — DX DG CHEST 2V
2 series · 2 of 2 positions shown · non-contrast
Comparison: April 12, 2015.

CLINICAL DATA: Gross hematuria, generalized abdominal pain.

EXAM:
CHEST  2 VIEW

[chest lat]
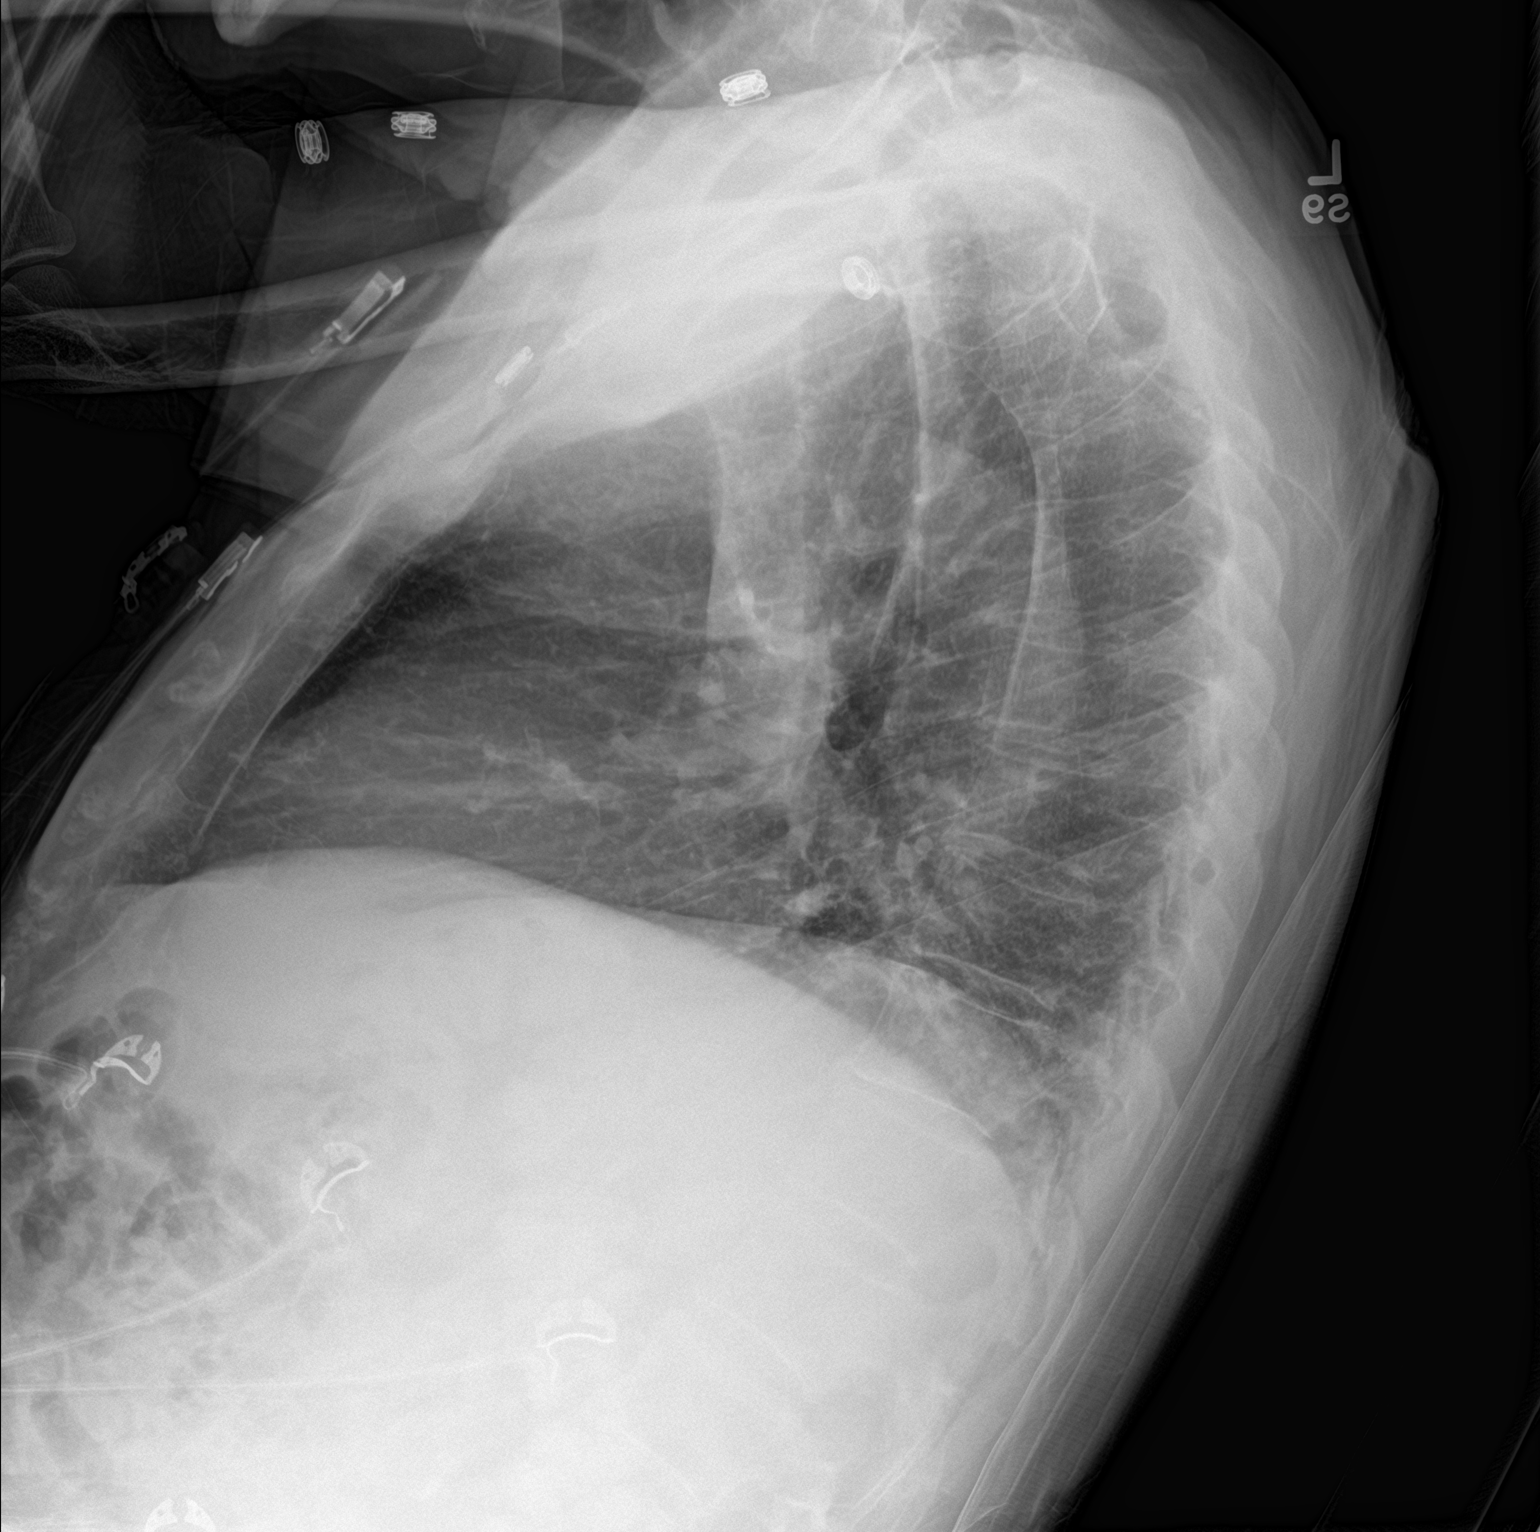

[chest ap]
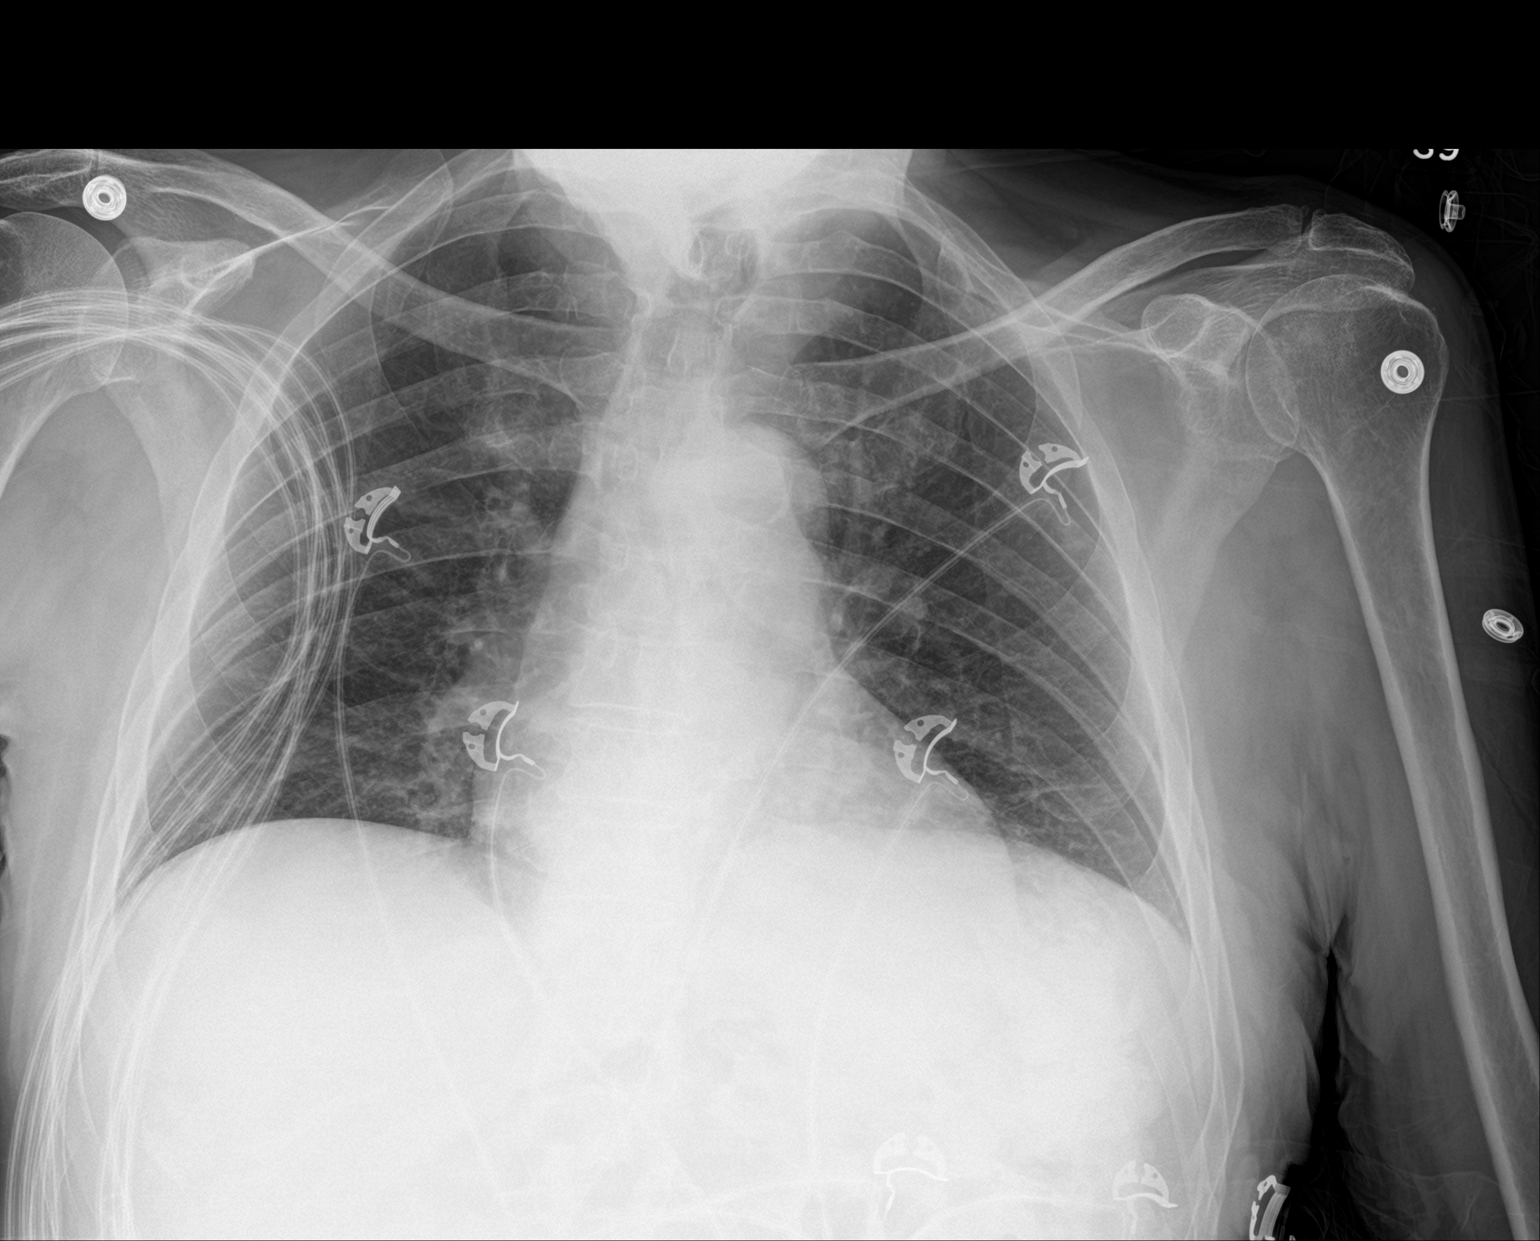

[2 of 2 positions shown; findings below may reference images not displayed]

FINDINGS: The heart size and mediastinal contours are within normal limits. No
pneumothorax or pleural effusion is noted. Minimal bibasilar
subsegmental atelectasis is noted secondary to hypoinflation of the
lungs. The visualized skeletal structures are unremarkable.
IMPRESSION: Minimal bibasilar subsegmental atelectasis secondary to
hypoinflation of the lungs.
